# Patient Record
Sex: Female | Born: 1940 | Race: White | Hispanic: No | Marital: Married | Smoking: Former smoker
Health system: Southern US, Community
[De-identification: ages and names within clinical notes are randomized; demographics above are authoritative.]

## PROBLEM LIST (undated history)

## (undated) DIAGNOSIS — N189 Chronic kidney disease, unspecified: Secondary | ICD-10-CM

## (undated) DIAGNOSIS — I10 Essential (primary) hypertension: Secondary | ICD-10-CM

## (undated) DIAGNOSIS — R0600 Dyspnea, unspecified: Secondary | ICD-10-CM

## (undated) DIAGNOSIS — N952 Postmenopausal atrophic vaginitis: Secondary | ICD-10-CM

## (undated) DIAGNOSIS — K635 Polyp of colon: Secondary | ICD-10-CM

## (undated) DIAGNOSIS — M1990 Unspecified osteoarthritis, unspecified site: Secondary | ICD-10-CM

## (undated) DIAGNOSIS — H5316 Psychophysical visual disturbances: Secondary | ICD-10-CM

## (undated) DIAGNOSIS — N183 Chronic kidney disease, stage 3 unspecified: Secondary | ICD-10-CM

## (undated) DIAGNOSIS — R05 Cough: Secondary | ICD-10-CM

## (undated) DIAGNOSIS — R062 Wheezing: Secondary | ICD-10-CM

## (undated) DIAGNOSIS — E785 Hyperlipidemia, unspecified: Secondary | ICD-10-CM

## (undated) DIAGNOSIS — M4317 Spondylolisthesis, lumbosacral region: Secondary | ICD-10-CM

## (undated) DIAGNOSIS — I5032 Chronic diastolic (congestive) heart failure: Secondary | ICD-10-CM

## (undated) DIAGNOSIS — I2510 Atherosclerotic heart disease of native coronary artery without angina pectoris: Secondary | ICD-10-CM

## (undated) DIAGNOSIS — K219 Gastro-esophageal reflux disease without esophagitis: Secondary | ICD-10-CM

## (undated) DIAGNOSIS — D638 Anemia in other chronic diseases classified elsewhere: Secondary | ICD-10-CM

## (undated) DIAGNOSIS — F329 Major depressive disorder, single episode, unspecified: Secondary | ICD-10-CM

## (undated) DIAGNOSIS — R443 Hallucinations, unspecified: Secondary | ICD-10-CM

## (undated) DIAGNOSIS — G4733 Obstructive sleep apnea (adult) (pediatric): Secondary | ICD-10-CM

## (undated) DIAGNOSIS — A809 Acute poliomyelitis, unspecified: Secondary | ICD-10-CM

## (undated) DIAGNOSIS — Z9889 Other specified postprocedural states: Secondary | ICD-10-CM

## (undated) DIAGNOSIS — C801 Malignant (primary) neoplasm, unspecified: Secondary | ICD-10-CM

## (undated) DIAGNOSIS — I639 Cerebral infarction, unspecified: Secondary | ICD-10-CM

## (undated) DIAGNOSIS — E559 Vitamin D deficiency, unspecified: Secondary | ICD-10-CM

## (undated) DIAGNOSIS — F32A Depression, unspecified: Secondary | ICD-10-CM

## (undated) DIAGNOSIS — R112 Nausea with vomiting, unspecified: Secondary | ICD-10-CM

## (undated) DIAGNOSIS — J45909 Unspecified asthma, uncomplicated: Secondary | ICD-10-CM

## (undated) DIAGNOSIS — E039 Hypothyroidism, unspecified: Secondary | ICD-10-CM

## (undated) DIAGNOSIS — J309 Allergic rhinitis, unspecified: Secondary | ICD-10-CM

## (undated) DIAGNOSIS — G4730 Sleep apnea, unspecified: Secondary | ICD-10-CM

## (undated) DIAGNOSIS — I219 Acute myocardial infarction, unspecified: Secondary | ICD-10-CM

## (undated) HISTORY — DX: Dyspnea, unspecified: R06.00

## (undated) HISTORY — DX: Obstructive sleep apnea (adult) (pediatric): G47.33

## (undated) HISTORY — DX: Malignant (primary) neoplasm, unspecified: C80.1

## (undated) HISTORY — DX: Hyperlipidemia, unspecified: E78.5

## (undated) HISTORY — DX: Allergic rhinitis, unspecified: J30.9

## (undated) HISTORY — DX: Wheezing: R06.2

## (undated) HISTORY — DX: Acute myocardial infarction, unspecified: I21.9

## (undated) HISTORY — DX: Cough: R05

## (undated) HISTORY — DX: Atherosclerotic heart disease of native coronary artery without angina pectoris: I25.10

## (undated) HISTORY — DX: Chronic kidney disease, stage 3 unspecified: N18.30

## (undated) HISTORY — PX: CORONARY BALLOON ANGIOPLASTY: CATH118233

## (undated) HISTORY — DX: Psychophysical visual disturbances: H53.16

## (undated) HISTORY — DX: Anemia in other chronic diseases classified elsewhere: D63.8

## (undated) HISTORY — DX: Depression, unspecified: F32.A

## (undated) HISTORY — DX: Chronic kidney disease, unspecified: N18.9

## (undated) HISTORY — DX: Polyp of colon: K63.5

## (undated) HISTORY — DX: Essential (primary) hypertension: I10

## (undated) HISTORY — DX: Chronic diastolic (congestive) heart failure: I50.32

## (undated) HISTORY — DX: Postmenopausal atrophic vaginitis: N95.2

## (undated) HISTORY — DX: Hypothyroidism, unspecified: E03.9

## (undated) HISTORY — PX: CORONARY ANGIOPLASTY WITH STENT PLACEMENT: SHX49

## (undated) HISTORY — PX: CHOLECYSTECTOMY: SHX55

## (undated) HISTORY — DX: Vitamin D deficiency, unspecified: E55.9

## (undated) HISTORY — DX: Major depressive disorder, single episode, unspecified: F32.9

## (undated) HISTORY — DX: Acute poliomyelitis, unspecified: A80.9

## (undated) HISTORY — DX: Unspecified asthma, uncomplicated: J45.909

## (undated) HISTORY — DX: Chronic kidney disease, stage 3 (moderate): N18.3

## (undated) HISTORY — DX: Gastro-esophageal reflux disease without esophagitis: K21.9

## (undated) HISTORY — DX: Hallucinations, unspecified: R44.3

## (undated) HISTORY — PX: APPENDECTOMY: SHX54

## (undated) HISTORY — PX: CAROTID STENT: SHX1301

## (undated) HISTORY — DX: Spondylolisthesis, lumbosacral region: M43.17

## (undated) HISTORY — PX: TOTAL ABDOMINAL HYSTERECTOMY: SHX209

---

## 1997-09-06 ENCOUNTER — Encounter (HOSPITAL_COMMUNITY): Admission: RE | Admit: 1997-09-06 | Discharge: 1997-12-05 | Payer: Self-pay | Admitting: Cardiology

## 1997-12-07 ENCOUNTER — Inpatient Hospital Stay (HOSPITAL_COMMUNITY): Admission: AD | Admit: 1997-12-07 | Discharge: 1997-12-09 | Payer: Self-pay | Admitting: Cardiology

## 1998-02-23 ENCOUNTER — Inpatient Hospital Stay (HOSPITAL_COMMUNITY): Admission: EM | Admit: 1998-02-23 | Discharge: 1998-02-25 | Payer: Self-pay | Admitting: *Deleted

## 2005-10-16 ENCOUNTER — Encounter: Payer: Self-pay | Admitting: Internal Medicine

## 2013-08-17 DIAGNOSIS — R443 Hallucinations, unspecified: Secondary | ICD-10-CM

## 2013-08-17 DIAGNOSIS — I639 Cerebral infarction, unspecified: Secondary | ICD-10-CM | POA: Insufficient documentation

## 2013-08-17 DIAGNOSIS — H5316 Psychophysical visual disturbances: Secondary | ICD-10-CM

## 2013-08-17 DIAGNOSIS — F32A Depression, unspecified: Secondary | ICD-10-CM | POA: Insufficient documentation

## 2013-08-17 DIAGNOSIS — F329 Major depressive disorder, single episode, unspecified: Secondary | ICD-10-CM | POA: Insufficient documentation

## 2013-08-17 HISTORY — DX: Psychophysical visual disturbances: H53.16

## 2013-08-17 HISTORY — DX: Hallucinations, unspecified: R44.3

## 2014-06-28 DIAGNOSIS — G4733 Obstructive sleep apnea (adult) (pediatric): Secondary | ICD-10-CM | POA: Diagnosis not present

## 2014-07-13 DIAGNOSIS — J208 Acute bronchitis due to other specified organisms: Secondary | ICD-10-CM | POA: Diagnosis not present

## 2014-07-13 DIAGNOSIS — Z6832 Body mass index (BMI) 32.0-32.9, adult: Secondary | ICD-10-CM | POA: Diagnosis not present

## 2014-07-16 DIAGNOSIS — E785 Hyperlipidemia, unspecified: Secondary | ICD-10-CM | POA: Diagnosis not present

## 2014-07-16 DIAGNOSIS — Z6831 Body mass index (BMI) 31.0-31.9, adult: Secondary | ICD-10-CM | POA: Diagnosis not present

## 2014-07-16 DIAGNOSIS — I2510 Atherosclerotic heart disease of native coronary artery without angina pectoris: Secondary | ICD-10-CM | POA: Diagnosis not present

## 2014-07-16 DIAGNOSIS — E039 Hypothyroidism, unspecified: Secondary | ICD-10-CM | POA: Diagnosis not present

## 2014-07-16 DIAGNOSIS — D509 Iron deficiency anemia, unspecified: Secondary | ICD-10-CM | POA: Diagnosis not present

## 2014-07-16 DIAGNOSIS — R7301 Impaired fasting glucose: Secondary | ICD-10-CM | POA: Diagnosis not present

## 2014-07-29 DIAGNOSIS — G4733 Obstructive sleep apnea (adult) (pediatric): Secondary | ICD-10-CM | POA: Diagnosis not present

## 2014-08-11 DIAGNOSIS — Z6832 Body mass index (BMI) 32.0-32.9, adult: Secondary | ICD-10-CM | POA: Diagnosis not present

## 2014-08-11 DIAGNOSIS — J019 Acute sinusitis, unspecified: Secondary | ICD-10-CM | POA: Diagnosis not present

## 2014-08-11 DIAGNOSIS — J208 Acute bronchitis due to other specified organisms: Secondary | ICD-10-CM | POA: Diagnosis not present

## 2014-08-16 DIAGNOSIS — H5316 Psychophysical visual disturbances: Secondary | ICD-10-CM | POA: Diagnosis not present

## 2014-08-16 DIAGNOSIS — I6330 Cerebral infarction due to thrombosis of unspecified cerebral artery: Secondary | ICD-10-CM | POA: Diagnosis not present

## 2014-08-16 DIAGNOSIS — G4733 Obstructive sleep apnea (adult) (pediatric): Secondary | ICD-10-CM | POA: Diagnosis not present

## 2014-08-26 DIAGNOSIS — H18411 Arcus senilis, right eye: Secondary | ICD-10-CM | POA: Diagnosis not present

## 2014-08-26 DIAGNOSIS — H02839 Dermatochalasis of unspecified eye, unspecified eyelid: Secondary | ICD-10-CM | POA: Diagnosis not present

## 2014-08-26 DIAGNOSIS — H2511 Age-related nuclear cataract, right eye: Secondary | ICD-10-CM | POA: Diagnosis not present

## 2014-08-26 DIAGNOSIS — H18412 Arcus senilis, left eye: Secondary | ICD-10-CM | POA: Diagnosis not present

## 2014-08-27 DIAGNOSIS — G4733 Obstructive sleep apnea (adult) (pediatric): Secondary | ICD-10-CM | POA: Diagnosis not present

## 2014-09-27 DIAGNOSIS — G4733 Obstructive sleep apnea (adult) (pediatric): Secondary | ICD-10-CM | POA: Diagnosis not present

## 2014-10-15 DIAGNOSIS — E785 Hyperlipidemia, unspecified: Secondary | ICD-10-CM | POA: Diagnosis not present

## 2014-10-15 DIAGNOSIS — D509 Iron deficiency anemia, unspecified: Secondary | ICD-10-CM | POA: Diagnosis not present

## 2014-10-15 DIAGNOSIS — R0982 Postnasal drip: Secondary | ICD-10-CM | POA: Diagnosis not present

## 2014-10-15 DIAGNOSIS — E039 Hypothyroidism, unspecified: Secondary | ICD-10-CM | POA: Diagnosis not present

## 2014-10-15 DIAGNOSIS — I2510 Atherosclerotic heart disease of native coronary artery without angina pectoris: Secondary | ICD-10-CM | POA: Diagnosis not present

## 2014-10-24 DIAGNOSIS — H25811 Combined forms of age-related cataract, right eye: Secondary | ICD-10-CM | POA: Diagnosis not present

## 2014-10-24 DIAGNOSIS — H2511 Age-related nuclear cataract, right eye: Secondary | ICD-10-CM | POA: Diagnosis not present

## 2014-10-25 DIAGNOSIS — H2512 Age-related nuclear cataract, left eye: Secondary | ICD-10-CM | POA: Diagnosis not present

## 2014-10-27 DIAGNOSIS — G4733 Obstructive sleep apnea (adult) (pediatric): Secondary | ICD-10-CM | POA: Diagnosis not present

## 2014-11-07 DIAGNOSIS — H2511 Age-related nuclear cataract, right eye: Secondary | ICD-10-CM | POA: Diagnosis not present

## 2014-11-07 DIAGNOSIS — H25812 Combined forms of age-related cataract, left eye: Secondary | ICD-10-CM | POA: Diagnosis not present

## 2014-11-27 DIAGNOSIS — G4733 Obstructive sleep apnea (adult) (pediatric): Secondary | ICD-10-CM | POA: Diagnosis not present

## 2014-12-27 DIAGNOSIS — G4733 Obstructive sleep apnea (adult) (pediatric): Secondary | ICD-10-CM | POA: Diagnosis not present

## 2015-01-12 DIAGNOSIS — K5730 Diverticulosis of large intestine without perforation or abscess without bleeding: Secondary | ICD-10-CM | POA: Diagnosis not present

## 2015-01-13 DIAGNOSIS — Z139 Encounter for screening, unspecified: Secondary | ICD-10-CM | POA: Diagnosis not present

## 2015-01-13 DIAGNOSIS — D509 Iron deficiency anemia, unspecified: Secondary | ICD-10-CM | POA: Diagnosis not present

## 2015-01-13 DIAGNOSIS — E785 Hyperlipidemia, unspecified: Secondary | ICD-10-CM | POA: Diagnosis not present

## 2015-01-13 DIAGNOSIS — I2510 Atherosclerotic heart disease of native coronary artery without angina pectoris: Secondary | ICD-10-CM | POA: Diagnosis not present

## 2015-01-13 DIAGNOSIS — Z9181 History of falling: Secondary | ICD-10-CM | POA: Diagnosis not present

## 2015-01-13 DIAGNOSIS — E039 Hypothyroidism, unspecified: Secondary | ICD-10-CM | POA: Diagnosis not present

## 2015-01-24 DIAGNOSIS — R269 Unspecified abnormalities of gait and mobility: Secondary | ICD-10-CM | POA: Diagnosis not present

## 2015-01-24 DIAGNOSIS — Z6833 Body mass index (BMI) 33.0-33.9, adult: Secondary | ICD-10-CM | POA: Diagnosis not present

## 2015-01-24 DIAGNOSIS — I10 Essential (primary) hypertension: Secondary | ICD-10-CM | POA: Diagnosis not present

## 2015-01-24 DIAGNOSIS — R531 Weakness: Secondary | ICD-10-CM | POA: Diagnosis not present

## 2015-01-24 DIAGNOSIS — R270 Ataxia, unspecified: Secondary | ICD-10-CM | POA: Diagnosis not present

## 2015-01-24 DIAGNOSIS — M6281 Muscle weakness (generalized): Secondary | ICD-10-CM | POA: Diagnosis not present

## 2015-01-27 DIAGNOSIS — G4733 Obstructive sleep apnea (adult) (pediatric): Secondary | ICD-10-CM | POA: Diagnosis not present

## 2015-02-02 DIAGNOSIS — Z1231 Encounter for screening mammogram for malignant neoplasm of breast: Secondary | ICD-10-CM | POA: Diagnosis not present

## 2015-02-07 DIAGNOSIS — I25119 Atherosclerotic heart disease of native coronary artery with unspecified angina pectoris: Secondary | ICD-10-CM | POA: Diagnosis not present

## 2015-02-07 DIAGNOSIS — E785 Hyperlipidemia, unspecified: Secondary | ICD-10-CM | POA: Diagnosis not present

## 2015-02-07 DIAGNOSIS — I5032 Chronic diastolic (congestive) heart failure: Secondary | ICD-10-CM | POA: Diagnosis not present

## 2015-02-07 DIAGNOSIS — I110 Hypertensive heart disease with heart failure: Secondary | ICD-10-CM | POA: Diagnosis not present

## 2015-02-13 DIAGNOSIS — R609 Edema, unspecified: Secondary | ICD-10-CM | POA: Diagnosis not present

## 2015-02-15 DIAGNOSIS — I951 Orthostatic hypotension: Secondary | ICD-10-CM | POA: Diagnosis not present

## 2015-02-15 DIAGNOSIS — N179 Acute kidney failure, unspecified: Secondary | ICD-10-CM | POA: Diagnosis not present

## 2015-02-15 DIAGNOSIS — E860 Dehydration: Secondary | ICD-10-CM | POA: Diagnosis not present

## 2015-02-15 DIAGNOSIS — E876 Hypokalemia: Secondary | ICD-10-CM | POA: Diagnosis not present

## 2015-02-15 DIAGNOSIS — I5032 Chronic diastolic (congestive) heart failure: Secondary | ICD-10-CM | POA: Diagnosis not present

## 2015-02-15 DIAGNOSIS — N189 Chronic kidney disease, unspecified: Secondary | ICD-10-CM | POA: Diagnosis not present

## 2015-02-15 DIAGNOSIS — I110 Hypertensive heart disease with heart failure: Secondary | ICD-10-CM | POA: Diagnosis not present

## 2015-02-15 DIAGNOSIS — R635 Abnormal weight gain: Secondary | ICD-10-CM | POA: Diagnosis not present

## 2015-02-27 DIAGNOSIS — N179 Acute kidney failure, unspecified: Secondary | ICD-10-CM | POA: Diagnosis not present

## 2015-02-27 DIAGNOSIS — R0602 Shortness of breath: Secondary | ICD-10-CM | POA: Diagnosis not present

## 2015-02-27 DIAGNOSIS — I5032 Chronic diastolic (congestive) heart failure: Secondary | ICD-10-CM | POA: Diagnosis not present

## 2015-02-27 DIAGNOSIS — G4733 Obstructive sleep apnea (adult) (pediatric): Secondary | ICD-10-CM | POA: Diagnosis not present

## 2015-02-27 DIAGNOSIS — I5021 Acute systolic (congestive) heart failure: Secondary | ICD-10-CM | POA: Diagnosis not present

## 2015-03-02 DIAGNOSIS — H1013 Acute atopic conjunctivitis, bilateral: Secondary | ICD-10-CM | POA: Diagnosis not present

## 2015-03-02 DIAGNOSIS — I5032 Chronic diastolic (congestive) heart failure: Secondary | ICD-10-CM | POA: Diagnosis not present

## 2015-03-04 DIAGNOSIS — E039 Hypothyroidism, unspecified: Secondary | ICD-10-CM | POA: Diagnosis not present

## 2015-03-04 DIAGNOSIS — J209 Acute bronchitis, unspecified: Secondary | ICD-10-CM | POA: Diagnosis not present

## 2015-03-04 DIAGNOSIS — K219 Gastro-esophageal reflux disease without esophagitis: Secondary | ICD-10-CM | POA: Diagnosis not present

## 2015-03-04 DIAGNOSIS — R05 Cough: Secondary | ICD-10-CM | POA: Diagnosis not present

## 2015-03-04 DIAGNOSIS — Z79899 Other long term (current) drug therapy: Secondary | ICD-10-CM | POA: Diagnosis not present

## 2015-03-04 DIAGNOSIS — R0602 Shortness of breath: Secondary | ICD-10-CM | POA: Diagnosis not present

## 2015-03-04 DIAGNOSIS — E780 Pure hypercholesterolemia: Secondary | ICD-10-CM | POA: Diagnosis not present

## 2015-03-04 DIAGNOSIS — I509 Heart failure, unspecified: Secondary | ICD-10-CM | POA: Diagnosis not present

## 2015-03-04 DIAGNOSIS — Z8673 Personal history of transient ischemic attack (TIA), and cerebral infarction without residual deficits: Secondary | ICD-10-CM | POA: Diagnosis not present

## 2015-03-04 DIAGNOSIS — F419 Anxiety disorder, unspecified: Secondary | ICD-10-CM | POA: Diagnosis not present

## 2015-03-04 DIAGNOSIS — J449 Chronic obstructive pulmonary disease, unspecified: Secondary | ICD-10-CM | POA: Diagnosis not present

## 2015-03-04 DIAGNOSIS — I252 Old myocardial infarction: Secondary | ICD-10-CM | POA: Diagnosis not present

## 2015-03-04 DIAGNOSIS — I10 Essential (primary) hypertension: Secondary | ICD-10-CM | POA: Diagnosis not present

## 2015-03-08 DIAGNOSIS — Z6834 Body mass index (BMI) 34.0-34.9, adult: Secondary | ICD-10-CM | POA: Diagnosis not present

## 2015-03-08 DIAGNOSIS — J208 Acute bronchitis due to other specified organisms: Secondary | ICD-10-CM | POA: Diagnosis not present

## 2015-03-27 DIAGNOSIS — I5021 Acute systolic (congestive) heart failure: Secondary | ICD-10-CM | POA: Diagnosis not present

## 2015-03-27 DIAGNOSIS — R0602 Shortness of breath: Secondary | ICD-10-CM | POA: Diagnosis not present

## 2015-03-27 DIAGNOSIS — I5032 Chronic diastolic (congestive) heart failure: Secondary | ICD-10-CM | POA: Diagnosis not present

## 2015-03-29 DIAGNOSIS — G4733 Obstructive sleep apnea (adult) (pediatric): Secondary | ICD-10-CM | POA: Diagnosis not present

## 2015-03-30 DIAGNOSIS — R0602 Shortness of breath: Secondary | ICD-10-CM | POA: Diagnosis not present

## 2015-03-30 DIAGNOSIS — I5021 Acute systolic (congestive) heart failure: Secondary | ICD-10-CM | POA: Diagnosis not present

## 2015-04-29 DIAGNOSIS — G4733 Obstructive sleep apnea (adult) (pediatric): Secondary | ICD-10-CM | POA: Diagnosis not present

## 2015-05-02 DIAGNOSIS — I2510 Atherosclerotic heart disease of native coronary artery without angina pectoris: Secondary | ICD-10-CM

## 2015-05-02 DIAGNOSIS — N183 Chronic kidney disease, stage 3 (moderate): Secondary | ICD-10-CM | POA: Diagnosis not present

## 2015-05-02 DIAGNOSIS — R0602 Shortness of breath: Secondary | ICD-10-CM | POA: Diagnosis not present

## 2015-05-02 DIAGNOSIS — I5032 Chronic diastolic (congestive) heart failure: Secondary | ICD-10-CM | POA: Diagnosis not present

## 2015-05-02 DIAGNOSIS — R05 Cough: Secondary | ICD-10-CM | POA: Diagnosis not present

## 2015-05-02 DIAGNOSIS — I25119 Atherosclerotic heart disease of native coronary artery with unspecified angina pectoris: Secondary | ICD-10-CM | POA: Insufficient documentation

## 2015-05-02 DIAGNOSIS — I110 Hypertensive heart disease with heart failure: Secondary | ICD-10-CM | POA: Diagnosis not present

## 2015-05-02 HISTORY — DX: Atherosclerotic heart disease of native coronary artery without angina pectoris: I25.10

## 2015-05-10 DIAGNOSIS — Z7982 Long term (current) use of aspirin: Secondary | ICD-10-CM | POA: Diagnosis not present

## 2015-05-10 DIAGNOSIS — N183 Chronic kidney disease, stage 3 (moderate): Secondary | ICD-10-CM | POA: Diagnosis not present

## 2015-05-10 DIAGNOSIS — Z8673 Personal history of transient ischemic attack (TIA), and cerebral infarction without residual deficits: Secondary | ICD-10-CM | POA: Diagnosis not present

## 2015-05-10 DIAGNOSIS — R0602 Shortness of breath: Secondary | ICD-10-CM | POA: Diagnosis not present

## 2015-05-10 DIAGNOSIS — Z79899 Other long term (current) drug therapy: Secondary | ICD-10-CM | POA: Diagnosis not present

## 2015-05-10 DIAGNOSIS — Z87891 Personal history of nicotine dependence: Secondary | ICD-10-CM | POA: Diagnosis not present

## 2015-05-10 DIAGNOSIS — I2510 Atherosclerotic heart disease of native coronary artery without angina pectoris: Secondary | ICD-10-CM | POA: Diagnosis not present

## 2015-05-10 DIAGNOSIS — Z955 Presence of coronary angioplasty implant and graft: Secondary | ICD-10-CM | POA: Diagnosis not present

## 2015-05-10 DIAGNOSIS — I5032 Chronic diastolic (congestive) heart failure: Secondary | ICD-10-CM | POA: Diagnosis not present

## 2015-05-10 DIAGNOSIS — I130 Hypertensive heart and chronic kidney disease with heart failure and stage 1 through stage 4 chronic kidney disease, or unspecified chronic kidney disease: Secondary | ICD-10-CM | POA: Diagnosis not present

## 2015-05-10 DIAGNOSIS — E785 Hyperlipidemia, unspecified: Secondary | ICD-10-CM | POA: Diagnosis not present

## 2015-05-29 DIAGNOSIS — G4733 Obstructive sleep apnea (adult) (pediatric): Secondary | ICD-10-CM | POA: Diagnosis not present

## 2015-06-05 DIAGNOSIS — R0602 Shortness of breath: Secondary | ICD-10-CM | POA: Diagnosis not present

## 2015-06-05 DIAGNOSIS — I5032 Chronic diastolic (congestive) heart failure: Secondary | ICD-10-CM | POA: Diagnosis not present

## 2015-06-05 DIAGNOSIS — E785 Hyperlipidemia, unspecified: Secondary | ICD-10-CM | POA: Diagnosis not present

## 2015-06-05 DIAGNOSIS — I2510 Atherosclerotic heart disease of native coronary artery without angina pectoris: Secondary | ICD-10-CM | POA: Diagnosis not present

## 2015-06-05 DIAGNOSIS — N189 Chronic kidney disease, unspecified: Secondary | ICD-10-CM

## 2015-06-05 HISTORY — DX: Chronic kidney disease, unspecified: N18.9

## 2015-06-29 DIAGNOSIS — G4733 Obstructive sleep apnea (adult) (pediatric): Secondary | ICD-10-CM | POA: Diagnosis not present

## 2015-07-13 DIAGNOSIS — K5730 Diverticulosis of large intestine without perforation or abscess without bleeding: Secondary | ICD-10-CM | POA: Diagnosis not present

## 2015-07-15 DIAGNOSIS — R0600 Dyspnea, unspecified: Secondary | ICD-10-CM | POA: Diagnosis not present

## 2015-07-15 DIAGNOSIS — Z1389 Encounter for screening for other disorder: Secondary | ICD-10-CM | POA: Diagnosis not present

## 2015-07-15 DIAGNOSIS — I2510 Atherosclerotic heart disease of native coronary artery without angina pectoris: Secondary | ICD-10-CM | POA: Diagnosis not present

## 2015-07-15 DIAGNOSIS — E039 Hypothyroidism, unspecified: Secondary | ICD-10-CM | POA: Diagnosis not present

## 2015-07-15 DIAGNOSIS — I10 Essential (primary) hypertension: Secondary | ICD-10-CM | POA: Diagnosis not present

## 2015-07-15 DIAGNOSIS — Z23 Encounter for immunization: Secondary | ICD-10-CM | POA: Diagnosis not present

## 2015-07-15 DIAGNOSIS — E785 Hyperlipidemia, unspecified: Secondary | ICD-10-CM | POA: Diagnosis not present

## 2015-07-15 DIAGNOSIS — D631 Anemia in chronic kidney disease: Secondary | ICD-10-CM | POA: Diagnosis not present

## 2015-07-30 DIAGNOSIS — G4733 Obstructive sleep apnea (adult) (pediatric): Secondary | ICD-10-CM | POA: Diagnosis not present

## 2015-08-01 DIAGNOSIS — K5730 Diverticulosis of large intestine without perforation or abscess without bleeding: Secondary | ICD-10-CM | POA: Diagnosis not present

## 2015-08-01 DIAGNOSIS — Z8673 Personal history of transient ischemic attack (TIA), and cerebral infarction without residual deficits: Secondary | ICD-10-CM | POA: Diagnosis not present

## 2015-08-01 DIAGNOSIS — Z79899 Other long term (current) drug therapy: Secondary | ICD-10-CM | POA: Diagnosis not present

## 2015-08-01 DIAGNOSIS — D124 Benign neoplasm of descending colon: Secondary | ICD-10-CM | POA: Diagnosis not present

## 2015-08-01 DIAGNOSIS — E039 Hypothyroidism, unspecified: Secondary | ICD-10-CM | POA: Diagnosis not present

## 2015-08-01 DIAGNOSIS — Z955 Presence of coronary angioplasty implant and graft: Secondary | ICD-10-CM | POA: Diagnosis not present

## 2015-08-01 DIAGNOSIS — D122 Benign neoplasm of ascending colon: Secondary | ICD-10-CM | POA: Diagnosis not present

## 2015-08-01 DIAGNOSIS — K621 Rectal polyp: Secondary | ICD-10-CM | POA: Diagnosis not present

## 2015-08-01 DIAGNOSIS — I10 Essential (primary) hypertension: Secondary | ICD-10-CM | POA: Diagnosis not present

## 2015-08-01 DIAGNOSIS — Z86010 Personal history of colonic polyps: Secondary | ICD-10-CM | POA: Diagnosis not present

## 2015-08-01 DIAGNOSIS — Z800 Family history of malignant neoplasm of digestive organs: Secondary | ICD-10-CM | POA: Diagnosis not present

## 2015-08-01 DIAGNOSIS — D128 Benign neoplasm of rectum: Secondary | ICD-10-CM | POA: Diagnosis not present

## 2015-08-01 DIAGNOSIS — Z9049 Acquired absence of other specified parts of digestive tract: Secondary | ICD-10-CM | POA: Diagnosis not present

## 2015-08-01 DIAGNOSIS — Z1211 Encounter for screening for malignant neoplasm of colon: Secondary | ICD-10-CM | POA: Diagnosis not present

## 2015-08-08 DIAGNOSIS — I6330 Cerebral infarction due to thrombosis of unspecified cerebral artery: Secondary | ICD-10-CM | POA: Diagnosis not present

## 2015-08-08 DIAGNOSIS — R443 Hallucinations, unspecified: Secondary | ICD-10-CM | POA: Diagnosis not present

## 2015-08-27 DIAGNOSIS — G4733 Obstructive sleep apnea (adult) (pediatric): Secondary | ICD-10-CM | POA: Diagnosis not present

## 2015-09-05 DIAGNOSIS — M5136 Other intervertebral disc degeneration, lumbar region: Secondary | ICD-10-CM | POA: Diagnosis not present

## 2015-09-07 DIAGNOSIS — J208 Acute bronchitis due to other specified organisms: Secondary | ICD-10-CM | POA: Diagnosis not present

## 2015-09-11 DIAGNOSIS — J208 Acute bronchitis due to other specified organisms: Secondary | ICD-10-CM | POA: Diagnosis not present

## 2015-09-15 DIAGNOSIS — J208 Acute bronchitis due to other specified organisms: Secondary | ICD-10-CM | POA: Diagnosis not present

## 2015-09-15 DIAGNOSIS — R0602 Shortness of breath: Secondary | ICD-10-CM | POA: Diagnosis not present

## 2015-09-15 DIAGNOSIS — R05 Cough: Secondary | ICD-10-CM | POA: Diagnosis not present

## 2015-09-18 DIAGNOSIS — J208 Acute bronchitis due to other specified organisms: Secondary | ICD-10-CM | POA: Diagnosis not present

## 2015-09-18 DIAGNOSIS — B370 Candidal stomatitis: Secondary | ICD-10-CM | POA: Diagnosis not present

## 2015-09-27 DIAGNOSIS — G4733 Obstructive sleep apnea (adult) (pediatric): Secondary | ICD-10-CM | POA: Diagnosis not present

## 2015-09-28 DIAGNOSIS — R140 Abdominal distension (gaseous): Secondary | ICD-10-CM | POA: Diagnosis not present

## 2015-09-28 DIAGNOSIS — K219 Gastro-esophageal reflux disease without esophagitis: Secondary | ICD-10-CM | POA: Diagnosis not present

## 2015-10-02 DIAGNOSIS — S92309A Fracture of unspecified metatarsal bone(s), unspecified foot, initial encounter for closed fracture: Secondary | ICD-10-CM | POA: Diagnosis not present

## 2015-10-02 DIAGNOSIS — S92351A Displaced fracture of fifth metatarsal bone, right foot, initial encounter for closed fracture: Secondary | ICD-10-CM | POA: Diagnosis not present

## 2015-10-02 DIAGNOSIS — M79671 Pain in right foot: Secondary | ICD-10-CM | POA: Diagnosis not present

## 2015-10-02 DIAGNOSIS — M85871 Other specified disorders of bone density and structure, right ankle and foot: Secondary | ICD-10-CM | POA: Diagnosis not present

## 2015-10-04 DIAGNOSIS — S99921A Unspecified injury of right foot, initial encounter: Secondary | ICD-10-CM | POA: Diagnosis not present

## 2015-10-16 DIAGNOSIS — S99921A Unspecified injury of right foot, initial encounter: Secondary | ICD-10-CM | POA: Diagnosis not present

## 2015-10-27 DIAGNOSIS — G4733 Obstructive sleep apnea (adult) (pediatric): Secondary | ICD-10-CM | POA: Diagnosis not present

## 2015-11-27 DIAGNOSIS — G4733 Obstructive sleep apnea (adult) (pediatric): Secondary | ICD-10-CM | POA: Diagnosis not present

## 2015-11-28 DIAGNOSIS — R399 Unspecified symptoms and signs involving the genitourinary system: Secondary | ICD-10-CM | POA: Diagnosis not present

## 2015-12-08 DIAGNOSIS — R0602 Shortness of breath: Secondary | ICD-10-CM | POA: Diagnosis not present

## 2015-12-08 DIAGNOSIS — I5032 Chronic diastolic (congestive) heart failure: Secondary | ICD-10-CM | POA: Diagnosis not present

## 2015-12-12 DIAGNOSIS — I5032 Chronic diastolic (congestive) heart failure: Secondary | ICD-10-CM | POA: Diagnosis not present

## 2015-12-13 DIAGNOSIS — L02413 Cutaneous abscess of right upper limb: Secondary | ICD-10-CM | POA: Diagnosis not present

## 2015-12-13 DIAGNOSIS — I5032 Chronic diastolic (congestive) heart failure: Secondary | ICD-10-CM | POA: Diagnosis not present

## 2015-12-14 DIAGNOSIS — I5032 Chronic diastolic (congestive) heart failure: Secondary | ICD-10-CM | POA: Diagnosis not present

## 2015-12-27 DIAGNOSIS — G4733 Obstructive sleep apnea (adult) (pediatric): Secondary | ICD-10-CM | POA: Diagnosis not present

## 2016-01-04 DIAGNOSIS — I25119 Atherosclerotic heart disease of native coronary artery with unspecified angina pectoris: Secondary | ICD-10-CM | POA: Diagnosis not present

## 2016-01-04 DIAGNOSIS — E785 Hyperlipidemia, unspecified: Secondary | ICD-10-CM | POA: Diagnosis not present

## 2016-01-04 DIAGNOSIS — I5032 Chronic diastolic (congestive) heart failure: Secondary | ICD-10-CM | POA: Diagnosis not present

## 2016-01-13 DIAGNOSIS — N390 Urinary tract infection, site not specified: Secondary | ICD-10-CM | POA: Diagnosis not present

## 2016-01-13 DIAGNOSIS — D638 Anemia in other chronic diseases classified elsewhere: Secondary | ICD-10-CM | POA: Diagnosis not present

## 2016-01-13 DIAGNOSIS — E039 Hypothyroidism, unspecified: Secondary | ICD-10-CM | POA: Diagnosis not present

## 2016-01-13 DIAGNOSIS — E785 Hyperlipidemia, unspecified: Secondary | ICD-10-CM | POA: Diagnosis not present

## 2016-01-13 DIAGNOSIS — I2510 Atherosclerotic heart disease of native coronary artery without angina pectoris: Secondary | ICD-10-CM | POA: Diagnosis not present

## 2016-01-13 DIAGNOSIS — I10 Essential (primary) hypertension: Secondary | ICD-10-CM | POA: Diagnosis not present

## 2016-01-27 DIAGNOSIS — G4733 Obstructive sleep apnea (adult) (pediatric): Secondary | ICD-10-CM | POA: Diagnosis not present

## 2016-01-30 DIAGNOSIS — R102 Pelvic and perineal pain: Secondary | ICD-10-CM | POA: Diagnosis not present

## 2016-01-30 DIAGNOSIS — N952 Postmenopausal atrophic vaginitis: Secondary | ICD-10-CM

## 2016-01-30 HISTORY — DX: Postmenopausal atrophic vaginitis: N95.2

## 2016-02-09 DIAGNOSIS — Z1231 Encounter for screening mammogram for malignant neoplasm of breast: Secondary | ICD-10-CM | POA: Diagnosis not present

## 2016-02-09 DIAGNOSIS — M810 Age-related osteoporosis without current pathological fracture: Secondary | ICD-10-CM | POA: Diagnosis not present

## 2016-02-27 DIAGNOSIS — G4733 Obstructive sleep apnea (adult) (pediatric): Secondary | ICD-10-CM | POA: Diagnosis not present

## 2016-03-09 DIAGNOSIS — J208 Acute bronchitis due to other specified organisms: Secondary | ICD-10-CM | POA: Diagnosis not present

## 2016-03-11 DIAGNOSIS — M545 Low back pain: Secondary | ICD-10-CM | POA: Diagnosis not present

## 2016-03-13 DIAGNOSIS — M545 Low back pain: Secondary | ICD-10-CM | POA: Diagnosis not present

## 2016-03-13 DIAGNOSIS — M546 Pain in thoracic spine: Secondary | ICD-10-CM | POA: Diagnosis not present

## 2016-03-28 DIAGNOSIS — G4733 Obstructive sleep apnea (adult) (pediatric): Secondary | ICD-10-CM | POA: Diagnosis not present

## 2016-04-28 DIAGNOSIS — G4733 Obstructive sleep apnea (adult) (pediatric): Secondary | ICD-10-CM | POA: Diagnosis not present

## 2016-05-07 DIAGNOSIS — I5032 Chronic diastolic (congestive) heart failure: Secondary | ICD-10-CM | POA: Diagnosis not present

## 2016-05-07 DIAGNOSIS — I2510 Atherosclerotic heart disease of native coronary artery without angina pectoris: Secondary | ICD-10-CM | POA: Diagnosis not present

## 2016-05-07 DIAGNOSIS — I110 Hypertensive heart disease with heart failure: Secondary | ICD-10-CM | POA: Diagnosis not present

## 2016-05-07 DIAGNOSIS — E785 Hyperlipidemia, unspecified: Secondary | ICD-10-CM | POA: Diagnosis not present

## 2016-05-20 DIAGNOSIS — M5416 Radiculopathy, lumbar region: Secondary | ICD-10-CM | POA: Diagnosis not present

## 2016-05-22 DIAGNOSIS — M5416 Radiculopathy, lumbar region: Secondary | ICD-10-CM | POA: Diagnosis not present

## 2016-05-22 DIAGNOSIS — M5126 Other intervertebral disc displacement, lumbar region: Secondary | ICD-10-CM | POA: Diagnosis not present

## 2016-05-24 DIAGNOSIS — M5416 Radiculopathy, lumbar region: Secondary | ICD-10-CM | POA: Diagnosis not present

## 2016-05-28 DIAGNOSIS — G4733 Obstructive sleep apnea (adult) (pediatric): Secondary | ICD-10-CM | POA: Diagnosis not present

## 2016-05-30 DIAGNOSIS — J208 Acute bronchitis due to other specified organisms: Secondary | ICD-10-CM | POA: Diagnosis not present

## 2016-06-13 DIAGNOSIS — M47817 Spondylosis without myelopathy or radiculopathy, lumbosacral region: Secondary | ICD-10-CM | POA: Diagnosis not present

## 2016-06-13 DIAGNOSIS — M5416 Radiculopathy, lumbar region: Secondary | ICD-10-CM | POA: Diagnosis not present

## 2016-06-28 DIAGNOSIS — G4733 Obstructive sleep apnea (adult) (pediatric): Secondary | ICD-10-CM | POA: Diagnosis not present

## 2016-07-20 DIAGNOSIS — E785 Hyperlipidemia, unspecified: Secondary | ICD-10-CM | POA: Diagnosis not present

## 2016-07-20 DIAGNOSIS — Z9181 History of falling: Secondary | ICD-10-CM | POA: Diagnosis not present

## 2016-07-20 DIAGNOSIS — I10 Essential (primary) hypertension: Secondary | ICD-10-CM | POA: Diagnosis not present

## 2016-07-20 DIAGNOSIS — E039 Hypothyroidism, unspecified: Secondary | ICD-10-CM | POA: Diagnosis not present

## 2016-07-20 DIAGNOSIS — I2510 Atherosclerotic heart disease of native coronary artery without angina pectoris: Secondary | ICD-10-CM | POA: Diagnosis not present

## 2016-07-20 DIAGNOSIS — Z1389 Encounter for screening for other disorder: Secondary | ICD-10-CM | POA: Diagnosis not present

## 2016-07-20 DIAGNOSIS — D638 Anemia in other chronic diseases classified elsewhere: Secondary | ICD-10-CM | POA: Diagnosis not present

## 2016-07-25 DIAGNOSIS — R0602 Shortness of breath: Secondary | ICD-10-CM | POA: Diagnosis not present

## 2016-07-25 DIAGNOSIS — J45909 Unspecified asthma, uncomplicated: Secondary | ICD-10-CM | POA: Diagnosis not present

## 2016-07-29 DIAGNOSIS — G4733 Obstructive sleep apnea (adult) (pediatric): Secondary | ICD-10-CM | POA: Diagnosis not present

## 2016-08-26 DIAGNOSIS — G4733 Obstructive sleep apnea (adult) (pediatric): Secondary | ICD-10-CM | POA: Diagnosis not present

## 2016-08-27 ENCOUNTER — Ambulatory Visit (INDEPENDENT_AMBULATORY_CARE_PROVIDER_SITE_OTHER): Payer: Medicare Other | Admitting: Emergency Medicine

## 2016-08-27 ENCOUNTER — Encounter: Payer: Self-pay | Admitting: Emergency Medicine

## 2016-08-27 VITALS — BP 116/74 | HR 73 | Ht 65.0 in | Wt 219.0 lb

## 2016-08-27 DIAGNOSIS — R059 Cough, unspecified: Secondary | ICD-10-CM

## 2016-08-27 DIAGNOSIS — R05 Cough: Secondary | ICD-10-CM | POA: Diagnosis not present

## 2016-08-27 DIAGNOSIS — R0602 Shortness of breath: Secondary | ICD-10-CM | POA: Diagnosis not present

## 2016-08-27 DIAGNOSIS — G4733 Obstructive sleep apnea (adult) (pediatric): Secondary | ICD-10-CM | POA: Diagnosis not present

## 2016-08-27 DIAGNOSIS — R062 Wheezing: Secondary | ICD-10-CM

## 2016-08-27 DIAGNOSIS — J301 Allergic rhinitis due to pollen: Secondary | ICD-10-CM

## 2016-08-27 MED ORDER — FLUTICASONE PROPIONATE 50 MCG/ACT NA SUSP: 2.0000 | Freq: Every day | NASAL | 2 refills | Status: DC

## 2016-08-27 NOTE — Patient Instructions (Signed)
Please stop Symbicort for now Increase your omeprazole to 20mg  twice a day until our next visit Start loratadine 10mg  daily Start fluticasone nasal spray, 2 sprays each nostril daily Start wearing your CPAP every night as you are able.  We will perform full pulmonary function testing at your next office visit.  Follow with Dr Lamonte Sakai next available with full PFT on the same day.

## 2016-08-27 NOTE — Progress Notes (Signed)
Subjective:    Patient ID: Jody Taylor, female    DOB: 1940-10-15, 76 y.o.   MRN: [id number]  HPI 76 year old woman with a history of hypertension, diastolic dysfunction, coronary artery disease, hypothyroidism, GERD. She has OSA but only uses CPAP occasionally. She is referred today for eval of dyspnea and wheezing. She reports that she has had slowly progressive exertional SOB over the last 2 yrs. She has also been hearing some wheeze vs UA noise for the year. No chest pain. She has a lot of cough, happens mainly at night. Non-productive. Has benefited from tussionex. She does have DuoNeb that she rarely uses. She uses albuterol HFA at night -  May help her some. She has been on Dulera qd for 2 years, recently changed to symbicort 2 weeks ago. She has astelin NS, takes prn. She has GERD sx, seems to be controlled on PPI.   She does snore some. Not when she uses the CPAP.    Review of Systems  Constitutional: Negative for fever and unexpected weight change.  HENT: Negative for congestion, dental problem, ear pain, nosebleeds, postnasal drip, rhinorrhea, sinus pressure, sneezing, sore throat and trouble swallowing.   Eyes: Negative for redness and itching.  Respiratory: Positive for cough and shortness of breath. Negative for chest tightness and wheezing.   Cardiovascular: Negative for palpitations and leg swelling.  Gastrointestinal: Negative for nausea and vomiting.  Genitourinary: Negative for dysuria.  Musculoskeletal: Negative for joint swelling.  Skin: Negative for rash.  Neurological: Negative for headaches.  Hematological: Does not bruise/bleed easily.  Psychiatric/Behavioral: Negative for dysphoric mood. The patient is not nervous/anxious.    Past Medical History:  Diagnosis Date  . 2-vessel coronary artery disease   . Adult hypothyroidism   . Anemia of chronic disease   . Chronic diastolic CHF (congestive heart failure) ([place])   . Chronic GERD   . Chronic kidney disease,  stage 3 (moderate)   . Colon polyp   . Depression   . Hyperlipidemia LDL goal <70   . Hypertension, essential, benign   . Vitamin D deficiency      No family history on file.   Social History   Social History  . Marital status: Single    Spouse name: N/A  . Number of children: N/A  . Years of education: N/A   Occupational History  . Not on file.   Social History Main Topics  . Smoking status: Never Smoker  . Smokeless tobacco: Never Used  . Alcohol use No  . Drug use: Unknown  . Sexual activity: Not on file   Other Topics Concern  . Not on file   Social History Narrative  . No narrative on file     Allergies  Allergen Reactions  . Ace Inhibitors Other (See Comments) and Cough    Chest pain   . Codeine   . Levaquin [Levofloxacin] Nausea And Vomiting     No outpatient prescriptions prior to visit.   No facility-administered medications prior to visit.         Objective:   Physical Exam Vitals:   08/27/16 1344  BP: 116/74  Pulse: 73  SpO2: 94%  Weight: 219 lb (99.3 kg)  Height: 5\' 5"  (1.651 m)   Gen: Pleasant, obese, in no distress,  normal affect  ENT: No lesions,  mouth clear,  oropharynx clear, no postnasal drip  Neck: No JVD, soft insp stridor.   Lungs: No use of accessory muscles, no wheeze  Cardiovascular: RRR,  heart sounds normal, no murmur or gallops, no peripheral edema  Musculoskeletal: No deformities, no cyanosis or clubbing  Neuro: alert, non focal  Skin: Warm, no lesions or rashes      Assessment & Plan:  Wheezing Appears to be most consistent with upper airway noise based on her description. She has been diagnosed with and treated for asthma clinically. Will attempt to confirm that dx w PFT. Stop BD for now  Allergic rhinitis Start loratadine 10mg  daily Start fluticasone nasal spray, 2 sprays each nostril daily   Obstructive sleep apnea Needs to work on CPAP compliance. Encouraged her to do this.   Cough Please stop  Symbicort for now Increase your omeprazole to 20mg  twice a day until our next visit Start loratadine 10mg  daily Start fluticasone nasal spray, 2 sprays each nostril daily.   Dyspnea Multifactorial. Full PFT ordered  Baltazar Apo, MD, PhD 09/12/2016, 9:23 AM [facility] [phone] or if no answer [phone]

## 2016-09-12 DIAGNOSIS — R05 Cough: Secondary | ICD-10-CM | POA: Insufficient documentation

## 2016-09-12 DIAGNOSIS — R0600 Dyspnea, unspecified: Secondary | ICD-10-CM

## 2016-09-12 DIAGNOSIS — R059 Cough, unspecified: Secondary | ICD-10-CM

## 2016-09-12 DIAGNOSIS — J309 Allergic rhinitis, unspecified: Secondary | ICD-10-CM | POA: Insufficient documentation

## 2016-09-12 DIAGNOSIS — R062 Wheezing: Secondary | ICD-10-CM | POA: Insufficient documentation

## 2016-09-12 DIAGNOSIS — G4733 Obstructive sleep apnea (adult) (pediatric): Secondary | ICD-10-CM | POA: Insufficient documentation

## 2016-09-12 HISTORY — DX: Obstructive sleep apnea (adult) (pediatric): G47.33

## 2016-09-12 HISTORY — DX: Dyspnea, unspecified: R06.00

## 2016-09-12 HISTORY — DX: Cough, unspecified: R05.9

## 2016-09-12 HISTORY — DX: Wheezing: R06.2

## 2016-09-12 HISTORY — DX: Allergic rhinitis, unspecified: J30.9

## 2016-09-12 NOTE — Assessment & Plan Note (Signed)
Please stop Symbicort for now Increase your omeprazole to 20mg  twice a day until our next visit Start loratadine 10mg  daily Start fluticasone nasal spray, 2 sprays each nostril daily.

## 2016-09-12 NOTE — Assessment & Plan Note (Addendum)
Appears to be most consistent with upper airway noise based on her description. She has been diagnosed with and treated for asthma clinically. Will attempt to confirm that dx w PFT. Stop BD for now

## 2016-09-12 NOTE — Assessment & Plan Note (Signed)
Start loratadine 10mg  daily Start fluticasone nasal spray, 2 sprays each nostril daily

## 2016-09-12 NOTE — Assessment & Plan Note (Signed)
Multifactorial. Full PFT ordered

## 2016-09-12 NOTE — Assessment & Plan Note (Signed)
Needs to work on CPAP compliance. Encouraged her to do this.

## 2016-09-24 DIAGNOSIS — M4807 Spinal stenosis, lumbosacral region: Secondary | ICD-10-CM | POA: Diagnosis not present

## 2016-09-24 DIAGNOSIS — M4317 Spondylolisthesis, lumbosacral region: Secondary | ICD-10-CM | POA: Diagnosis not present

## 2016-09-24 DIAGNOSIS — M5417 Radiculopathy, lumbosacral region: Secondary | ICD-10-CM | POA: Diagnosis not present

## 2016-09-24 DIAGNOSIS — M545 Low back pain: Secondary | ICD-10-CM | POA: Diagnosis not present

## 2016-09-26 DIAGNOSIS — G4733 Obstructive sleep apnea (adult) (pediatric): Secondary | ICD-10-CM | POA: Diagnosis not present

## 2016-10-02 DIAGNOSIS — R2689 Other abnormalities of gait and mobility: Secondary | ICD-10-CM | POA: Diagnosis not present

## 2016-10-02 DIAGNOSIS — M545 Low back pain: Secondary | ICD-10-CM | POA: Diagnosis not present

## 2016-10-02 DIAGNOSIS — M6281 Muscle weakness (generalized): Secondary | ICD-10-CM | POA: Diagnosis not present

## 2016-10-04 DIAGNOSIS — M6281 Muscle weakness (generalized): Secondary | ICD-10-CM | POA: Diagnosis not present

## 2016-10-04 DIAGNOSIS — M545 Low back pain: Secondary | ICD-10-CM | POA: Diagnosis not present

## 2016-10-04 DIAGNOSIS — R2689 Other abnormalities of gait and mobility: Secondary | ICD-10-CM | POA: Diagnosis not present

## 2016-10-07 DIAGNOSIS — M6281 Muscle weakness (generalized): Secondary | ICD-10-CM | POA: Diagnosis not present

## 2016-10-07 DIAGNOSIS — M545 Low back pain: Secondary | ICD-10-CM | POA: Diagnosis not present

## 2016-10-07 DIAGNOSIS — R2689 Other abnormalities of gait and mobility: Secondary | ICD-10-CM | POA: Diagnosis not present

## 2016-10-10 DIAGNOSIS — R2689 Other abnormalities of gait and mobility: Secondary | ICD-10-CM | POA: Diagnosis not present

## 2016-10-10 DIAGNOSIS — M545 Low back pain: Secondary | ICD-10-CM | POA: Diagnosis not present

## 2016-10-10 DIAGNOSIS — M6281 Muscle weakness (generalized): Secondary | ICD-10-CM | POA: Diagnosis not present

## 2016-10-11 ENCOUNTER — Other Ambulatory Visit: Payer: Self-pay | Admitting: Neurosurgery

## 2016-10-21 ENCOUNTER — Ambulatory Visit (INDEPENDENT_AMBULATORY_CARE_PROVIDER_SITE_OTHER): Payer: Medicare Other | Admitting: Emergency Medicine

## 2016-10-21 ENCOUNTER — Encounter: Payer: Self-pay | Admitting: Emergency Medicine

## 2016-10-21 DIAGNOSIS — R0602 Shortness of breath: Secondary | ICD-10-CM

## 2016-10-21 DIAGNOSIS — R062 Wheezing: Secondary | ICD-10-CM

## 2016-10-21 DIAGNOSIS — R05 Cough: Secondary | ICD-10-CM

## 2016-10-21 DIAGNOSIS — G4733 Obstructive sleep apnea (adult) (pediatric): Secondary | ICD-10-CM

## 2016-10-21 DIAGNOSIS — R059 Cough, unspecified: Secondary | ICD-10-CM

## 2016-10-21 LAB — PULMONARY FUNCTION TEST
DL/VA % PRED: 102 %
DL/VA: 4.72 ml/min/mmHg/L
DLCO COR % PRED: 75 %
DLCO COR: 16.9 ml/min/mmHg
DLCO unc % pred: 73 %
DLCO unc: 16.41 ml/min/mmHg
FEF 25-75 POST: 2.25 L/s
FEF 25-75 Pre: 1.89 L/sec
FEF2575-%CHANGE-POST: 19 %
FEF2575-%PRED-PRE: 118 %
FEF2575-%Pred-Post: 141 %
FEV1-%Change-Post: 4 %
FEV1-%Pred-Post: 93 %
FEV1-%Pred-Pre: 89 %
FEV1-Post: 1.86 L
FEV1-Pre: 1.78 L
FEV1FVC-%CHANGE-POST: 1 %
FEV1FVC-%PRED-PRE: 108 %
FEV6-%Change-Post: 2 %
FEV6-%PRED-PRE: 87 %
FEV6-%Pred-Post: 89 %
FEV6-Post: 2.24 L
FEV6-Pre: 2.19 L
FEV6FVC-%Change-Post: 0 %
FEV6FVC-%Pred-Post: 105 %
FEV6FVC-%Pred-Pre: 104 %
FVC-%Change-Post: 2 %
FVC-%PRED-POST: 85 %
FVC-%PRED-PRE: 83 %
FVC-POST: 2.25 L
FVC-PRE: 2.2 L
PRE FEV1/FVC RATIO: 81 %
Post FEV1/FVC ratio: 83 %
Post FEV6/FVC ratio: 100 %
Pre FEV6/FVC Ratio: 100 %
RV % pred: 85 %
RV: 1.9 L
TLC % pred: 86 %
TLC: 4.17 L

## 2016-10-21 NOTE — Addendum Note (Signed)
Addended by: Jannette Spanner on: 10/21/2016 12:43 PM   Modules accepted: Orders

## 2016-10-21 NOTE — Progress Notes (Signed)
Subjective:    Patient ID: Jody Taylor, female    DOB: March 18, 1941, 76 y.o.   MRN: [id number]  HPI 76 year old woman with a history of hypertension, diastolic dysfunction, coronary artery disease, hypothyroidism, GERD. She has OSA but only uses CPAP occasionally. She is referred today for eval of dyspnea and wheezing. She reports that she has had slowly progressive exertional SOB over the last 2 yrs. She has also been hearing some wheeze vs UA noise for the year. No chest pain. She has a lot of cough, happens mainly at night. Non-productive. Has benefited from tussionex. She does have DuoNeb that she rarely uses. She uses albuterol HFA at night -  May help her some. She has been on Dulera qd for 2 years, recently changed to symbicort 2 weeks ago. She has astelin NS, takes prn. She has GERD sx, seems to be controlled on PPI.   She does snore some. Not when she uses the CPAP.   ROV 10/21/16 -- This follow-up visit for evaluation of wheezing and associated shortness of breath. She has been hearing upper airway noise versus wheeze for several months. Also describes shortness of breath with exertion. She has obstructive sleep apnea and uses CPAP unreliably. She had been treated empirically for possible asthma, we stopped her ICS/LABA at our initial visit. She is also being treated for chronic rhinitis and GERD. We increased her omeprazole, added loratadine, added fluticasone nasal spray last visit. She underwent pulmonary function testing today that I have personally reviewed. This shows, no response to bronchodilator, normal lung volumes. She has a decreased diffusion capacity that corrects to the normal range when adjusted for alveolar volume. No evidence to support asthma.   Review of Systems  Constitutional: Negative for fever and unexpected weight change.  HENT: Negative for congestion, dental problem, ear pain, nosebleeds, postnasal drip, rhinorrhea, sinus pressure, sneezing, sore throat and trouble  swallowing.   Eyes: Negative for redness and itching.  Respiratory: Positive for cough and shortness of breath. Negative for chest tightness and wheezing.   Cardiovascular: Negative for palpitations and leg swelling.  Gastrointestinal: Negative for nausea and vomiting.  Genitourinary: Negative for dysuria.  Musculoskeletal: Negative for joint swelling.  Skin: Negative for rash.  Neurological: Negative for headaches.  Hematological: Does not bruise/bleed easily.  Psychiatric/Behavioral: Negative for dysphoric mood. The patient is not nervous/anxious.    Past Medical History:  Diagnosis Date  . 2-vessel coronary artery disease   . Adult hypothyroidism   . Anemia of chronic disease   . Chronic diastolic CHF (congestive heart failure) ([place])   . Chronic GERD   . Chronic kidney disease, stage 3 (moderate)   . Colon polyp   . Depression   . Hyperlipidemia LDL goal <70   . Hypertension, essential, benign   . Vitamin D deficiency      No family history on file.   Social History   Social History  . Marital status: Single    Spouse name: N/A  . Number of children: N/A  . Years of education: N/A   Occupational History  . Not on file.   Social History Main Topics  . Smoking status: Never Smoker  . Smokeless tobacco: Never Used  . Alcohol use No  . Drug use: Unknown  . Sexual activity: Not on file   Other Topics Concern  . Not on file   Social History Narrative  . No narrative on file     Allergies  Allergen Reactions  . Ace  Inhibitors Other (See Comments) and Cough    Chest pain   . Codeine   . Levaquin [Levofloxacin] Nausea And Vomiting     Outpatient Medications Prior to Visit  Medication Sig Dispense Refill  . Albuterol Sulfate 108 (90 Base) MCG/ACT AEPB Inhale into the lungs.    [name] aspirin EC 81 MG tablet Take 81 mg by mouth daily.    [name] atorvastatin (LIPITOR) 80 MG tablet Take 80 mg by mouth daily.    [name] azelastine (ASTELIN) 0.1 % nasal spray Place into both  nostrils 2 (two) times daily. Use in each nostril as directed    . buPROPion (WELLBUTRIN SR) 150 MG 12 hr tablet Take 150 mg by mouth 2 (two) times daily.    . celecoxib (CELEBREX) 200 MG capsule Take 200 mg by mouth 2 (two) times daily.    . citalopram (CELEXA) 40 MG tablet Take 40 mg by mouth daily.    . fluticasone (FLONASE) 50 MCG/ACT nasal spray Place 2 sprays into both nostrils daily. 16 g 2  . furosemide (LASIX) 80 MG tablet Take 80 mg by mouth 2 (two) times daily.    [name] ipratropium-albuterol (DUONEB) 0.5-2.5 (3) MG/3ML SOLN Take 3 mLs by nebulization.    . isosorbide mononitrate (IMDUR) 120 MG 24 hr tablet Take 120 mg by mouth daily.    [name] levothyroxine (SYNTHROID, LEVOTHROID) 50 MCG tablet Take 50 mcg by mouth daily before breakfast.    . metoprolol succinate (TOPROL-XL) 50 MG 24 hr tablet Take 50 mg by mouth daily. Take with or immediately following a meal.    . nystatin (MYCOSTATIN) 100000 UNIT/ML suspension Take 5 mLs by mouth 4 (four) times daily.    [name] omeprazole (PRILOSEC) 20 MG capsule Take 20 mg by mouth daily.    . Probiotic Product (RESTORA PO) Take by mouth.    . QUEtiapine (SEROQUEL) 25 MG tablet Take 25 mg by mouth at bedtime.    . traMADol (ULTRAM) 50 MG tablet Take by mouth every 6 (six) hours as needed.     No facility-administered medications prior to visit.         Objective:   Physical Exam Vitals:   10/21/16 1040  BP: 134/90  Pulse: 67  SpO2: 95%  Weight: 220 lb (99.8 kg)  Height: 5' 2.5" (1.588 m)   Gen: Pleasant, obese, in no distress,  normal affect  ENT: No lesions,  mouth clear,  oropharynx clear, no postnasal drip  Neck: No JVD, no stridor, no hoarseness  Lungs: No use of accessory muscles, no wheeze  Cardiovascular: RRR, heart sounds normal, no murmur or gallops, no peripheral edema  Musculoskeletal: No deformities, no cyanosis or clubbing  Neuro: alert, non focal  Skin: Warm, no lesions or rashes      Assessment & Plan:  Cough With  contribution from upper airway irritation, allergic rhinitis, possibly also breakthrough GERD. Her cough and upper airway noise are both better after the changes made last visit. We will decrease omeprazole back to once a day, continue fluticasone and loratadine. I will not restart a bronchodilator at this time based on her pulmonary function testing  Dyspnea No evidence for airflow obstruction on her pulmonary function testing. I suspect that her dyspnea is due in part to deconditioning. She also has some upper airway irritation that waxes and wanes. No evidence to support asthma. I do not believe she is high risk for surgery. She is considering back surgery and should be a suitable candidate for this.  Obstructive sleep apnea Not currently on reliable therapy. She believes that she might benefit from nasal pillows. We will try to get her new supplies, nasal pillows from a new DME. She owns her CPAP machine.  Wheezing Suspect upper airway in nature based on her reassuring pulmonary function testing.  Baltazar Apo, MD, PhD 10/21/2016, 11:03 AM [facility] [phone] or if no answer [phone]

## 2016-10-21 NOTE — Assessment & Plan Note (Signed)
Not currently on reliable therapy. She believes that she might benefit from nasal pillows. We will try to get her new supplies, nasal pillows from a new DME. She owns her CPAP machine.

## 2016-10-21 NOTE — Assessment & Plan Note (Addendum)
No evidence for airflow obstruction on her pulmonary function testing. I suspect that her dyspnea is due in part to deconditioning. She also has some upper airway irritation that waxes and wanes. No evidence to support asthma. I do not believe she is high risk for surgery. She is considering back surgery and should be a suitable candidate for this. Walking oximetry today to ensure that she does not desaturate.

## 2016-10-21 NOTE — Addendum Note (Signed)
Addended by: Jannette Spanner on: 10/21/2016 01:47 PM   Modules accepted: Orders

## 2016-10-21 NOTE — Progress Notes (Signed)
PFT done today. 

## 2016-10-21 NOTE — Patient Instructions (Signed)
We will perform a walking oximetry on room air today.  Try to work on using your CPAP every night. We will work on getting you nasal pillows as a new mask.  Try decreasing your omeprazole back down to once a day.  Continue your loratadine and fluticasone nasal spray as you are taking them.  Follow with Dr Lamonte Sakai in 3 months or sooner if you have any problems.

## 2016-10-21 NOTE — Assessment & Plan Note (Signed)
With contribution from upper airway irritation, allergic rhinitis, possibly also breakthrough GERD. Her cough and upper airway noise are both better after the changes made last visit. We will decrease omeprazole back to once a day, continue fluticasone and loratadine. I will not restart a bronchodilator at this time based on her pulmonary function testing

## 2016-10-21 NOTE — Assessment & Plan Note (Signed)
Suspect upper airway in nature based on her reassuring pulmonary function testing.

## 2016-10-26 DIAGNOSIS — H1013 Acute atopic conjunctivitis, bilateral: Secondary | ICD-10-CM | POA: Diagnosis not present

## 2016-10-26 DIAGNOSIS — G4733 Obstructive sleep apnea (adult) (pediatric): Secondary | ICD-10-CM | POA: Diagnosis not present

## 2016-10-29 NOTE — Pre-Procedure Instructions (Signed)
CELA NEWCOM  10/29/2016      [place], [place] - [address] [place]  [address] Phone: [phone] Fax: [phone]    Your procedure is scheduled on May 24  Report to [place] at 1215 P.M.  Call this number if you have problems the morning of surgery:  [phone]   Remember:  Do not eat food or drink liquids after midnight.   Take these medicines the morning of surgery with A SIP OF WATER Albuterol Sulfate 108 (90 Base),  buPROPion (WELLBUTRIN, citalopram (CELEXA) , famotidine (PEPCID),  ipratropium-albuterol (DUONEB), isosorbide mononitrate (IMDUR) , levothyroxine (SYNTHROID, LEVOTHROID, metoprolol (LOPRESSOR), omeprazole (PRILOSEC)  7 days prior to surgery STOP taking any celecoxib (CELEBREX)   Aspirin, Aleve, Naproxen, Ibuprofen, Motrin, Advil, Goody's, BC's, all herbal medications, fish oil, and all vitamins  BRING INHALERS WITH YOU THE DAY OF SURGERY   Do not wear jewelry, make-up or nail polish.  Do not wear lotions, powders, or perfumes, or deoderant.  Do not shave 48 hours prior to surgery.  Men may shave face and neck.  Do not bring valuables to the hospital.  [facility] is not responsible for any belongings or valuables.  Contacts, dentures or bridgework may not be worn into surgery.  Leave your suitcase in the car.  After surgery it may be brought to your room.  For patients admitted to the hospital, discharge time will be determined by your treatment team.  Patients discharged the day of surgery will not be allowed to drive home.    Special instructions:   [facility]- Preparing For Surgery  Before surgery, you can play an important role. Because skin is not sterile, your skin needs to be as free of germs as possible. You can reduce the number of germs on your skin by washing with CHG (chlorahexidine gluconate) Soap before surgery.  CHG is an antiseptic cleaner which kills germs and bonds  with the skin to continue killing germs even after washing.  Please do not use if you have an allergy to CHG or antibacterial soaps. If your skin becomes reddened/irritated stop using the CHG.  Do not shave (including legs and underarms) for at least 48 hours prior to first CHG shower. It is OK to shave your face.  Please follow these instructions carefully.   1. Shower the NIGHT BEFORE SURGERY and the MORNING OF SURGERY with CHG.   2. If you chose to wash your hair, wash your hair first as usual with your normal shampoo.  3. After you shampoo, rinse your hair and body thoroughly to remove the shampoo.  4. Use CHG as you would any other liquid soap. You can apply CHG directly to the skin and wash gently with a scrungie or a clean washcloth.   5. Apply the CHG Soap to your body ONLY FROM THE NECK DOWN.  Do not use on open wounds or open sores. Avoid contact with your eyes, ears, mouth and genitals (private parts). Wash genitals (private parts) with your normal soap.  6. Wash thoroughly, paying special attention to the area where your surgery will be performed.  7. Thoroughly rinse your body with warm water from the neck down.  8. DO NOT shower/wash with your normal soap after using and rinsing off the CHG Soap.  9. Pat yourself dry with a CLEAN TOWEL.   10. Wear CLEAN PAJAMAS   11. Place  CLEAN SHEETS on your bed the night of your first shower and DO NOT SLEEP WITH PETS.    Day of Surgery: Do not apply any deodorants/lotions. Please wear clean clothes to the hospital/surgery center.      Please read over the following fact sheets that you were given.

## 2016-10-30 ENCOUNTER — Encounter (HOSPITAL_COMMUNITY): Payer: Self-pay | Admitting: Urology

## 2016-10-30 ENCOUNTER — Encounter (HOSPITAL_COMMUNITY)
Admission: RE | Admit: 2016-10-30 | Discharge: 2016-10-30 | Disposition: A | Discharge status: Home / Self Care | Payer: Medicare Other | Source: Ambulatory Visit | Attending: Neurosurgery | Admitting: Neurosurgery

## 2016-10-30 DIAGNOSIS — Z01810 Encounter for preprocedural cardiovascular examination: Secondary | ICD-10-CM | POA: Insufficient documentation

## 2016-10-30 DIAGNOSIS — Z01812 Encounter for preprocedural laboratory examination: Secondary | ICD-10-CM | POA: Diagnosis not present

## 2016-10-30 DIAGNOSIS — I10 Essential (primary) hypertension: Secondary | ICD-10-CM | POA: Diagnosis not present

## 2016-10-30 HISTORY — DX: Unspecified osteoarthritis, unspecified site: M19.90

## 2016-10-30 HISTORY — DX: Other specified postprocedural states: R11.2

## 2016-10-30 HISTORY — DX: Sleep apnea, unspecified: G47.30

## 2016-10-30 HISTORY — DX: Cerebral infarction, unspecified: I63.9

## 2016-10-30 HISTORY — DX: Other specified postprocedural states: Z98.890

## 2016-10-30 LAB — BASIC METABOLIC PANEL
Anion gap: 6 (ref 5–15)
BUN: 20 mg/dL (ref 6–20)
CHLORIDE: 105 mmol/L (ref 101–111)
CO2: 26 mmol/L (ref 22–32)
CREATININE: 1.66 mg/dL — AB (ref 0.44–1.00)
Calcium: 8.7 mg/dL — ABNORMAL LOW (ref 8.9–10.3)
GFR calc Af Amer: 34 mL/min — ABNORMAL LOW (ref >60)
GFR calc non Af Amer: 29 mL/min — ABNORMAL LOW (ref >60)
GLUCOSE: 116 mg/dL — AB (ref 65–99)
POTASSIUM: 4.1 mmol/L (ref 3.5–5.1)
SODIUM: 137 mmol/L (ref 135–145)

## 2016-10-30 LAB — CBC
HEMATOCRIT: 39.3 % (ref 36.0–46.0)
Hemoglobin: 12.5 g/dL (ref 12.0–15.0)
MCH: 29.8 pg (ref 26.0–34.0)
MCHC: 31.8 g/dL (ref 30.0–36.0)
MCV: 93.6 fL (ref 78.0–100.0)
Platelets: 296 10*3/uL (ref 150–400)
RBC: 4.2 MIL/uL (ref 3.87–5.11)
RDW: 13.9 % (ref 11.5–15.5)
WBC: 10.7 10*3/uL — ABNORMAL HIGH (ref 4.0–10.5)

## 2016-10-30 LAB — SURGICAL PCR SCREEN
MRSA, PCR: POSITIVE — AB
Staphylococcus aureus: POSITIVE — AB

## 2016-10-30 MED ORDER — CHLORHEXIDINE GLUCONATE CLOTH 2 % EX PADS: 6.0000 | MEDICATED_PAD | Freq: Once | CUTANEOUS | Status: DC

## 2016-10-30 NOTE — Progress Notes (Addendum)
PCP - Nelda Bucks Cardiologist - Shirlee More, EKG tracing, ECHO requested from MD. Pt states she did not have to see Dr. Bettina Gavia prior to surgery.   Pt reports having a "mini stroke" in the past, but does not remember which doctor was following her for this. Pt states she does not know when she had this stroke and has no deficits from it, but an MRI showed this result.   Pt also reports possible history of reaction to blood after a miscarriage. Pt states she received blood and broke out. Pt to have T/S drawn day of surgery. Message sent to office for Dr. Adline Mango nurse regarding this.   EKG - 10/30/2016  Stress Test - 07/07/10 in epic ECHO - 03/30/15 requested from Dr. Bettina Gavia Cardiac Cath - 05/10/15 in care everywhere  Sleep Study - requested from Dr. Alcide Clever CPAP - patient wears CPAP intermittently and does not know pressure settings.   Patient denies shortness of breath, fever, cough and chest pain at PAT appointment  Patient verbalized understanding of instructions that were given to them at the PAT appointment. Patient was also instructed that they will need to review over the PAT instructions again at home before surgery.

## 2016-10-30 NOTE — Progress Notes (Signed)
PCR positive for MRSA. Prescription called to [facility] and patient notified.

## 2016-10-31 ENCOUNTER — Encounter (HOSPITAL_COMMUNITY): Payer: Self-pay

## 2016-10-31 NOTE — Progress Notes (Addendum)
Anesthesia Chart Review: Patient is a 76 year old female scheduled for L5-S1 PLIF, interbody prosthesis, posterior lateral arthrodesis on 11/07/2016 by Dr. Arnoldo Morale.  History includes never smoker, postoperative nausea vomiting, CAD s/p PCI (per Dr. Delena Bali 07/20/16 note: history of LAD and RCA stents; only mild CAD by 05/10/15 [facility]), chronic diastolic CHF, hypertension, hyperlipidemia, GERD, hypothyroidism, OSA (CPAP, intermittent use), CKD stage III, anemia, CVA (asymptomatic; reported as incidental finding on MRI), depression, arthritis, hysterectomy, cholecystectomy, appendectomy. She reported a history of reaction to blood (rash) following a miscarriage (PAT RN notified Dr. Arnoldo Morale' office). BMI is consistent with obesity.  - PCP is Dr. Nelda Bucks. 07/20/16 office note is scanned under the Media tab. - Cardiologist is Dr. Shirlee More with [facility] (see Care Everywhere), last visit 05/07/16. He signed a note stating, "Proceed with surgery." - Pulmonologist is Dr. Baltazar Apo, last visit 10/21/16. He wrote, "No evidence for airflow obstruction on her pulmonary function testing. I suspect that her dyspnea is due in part to deconditioning. She also has some upper airway irritation that waxes and wanes. No evidence to support asthma. I do not believe she is high risk for surgery. She is considering back surgery and should be a suitable candidate for this." He did not feel that currently she was on reliable therapy for OSA and was requested she get nasal pillows with her CPAP. (Reported previous sleep study by Dr. Alcide Clever.) - Neurologist is Dr. Charlaine Dalton with [facility] (see Care Everywhere), last visit 08/08/15.  Meds include albuterol, aspirin 81 mg, Lipitor, Wellbutrin, Celebrex, Celexa, Pepcid, Flonase, Lasix, DuoNeb, Imdur, levothyroxine, Lopressor, Prilosec, KCl, Seroquel, turmeric.  BP (!) 142/84   Pulse 60   Temp 36.6 C   Resp 20   Ht 5' 2.5" (1.588 m)    Wt 216 lb 9.6 oz (98.2 kg)   LMP  (LMP Unknown)   SpO2 95%   BMI 38.99 kg/m   EKG 10/30/16: SR with first degree AV block.  Cardiac cath 05/10/15:   Angiographic findings  Cardiac Arteries and Lesion Findings LMCA: Normal. LAD: Normal. LCx: Normal. RCA: Abnormal. Lesion on R PDA: Ostial.35% stenosis 5 mm length . Pre procedure TIMI III flow was noted. Good run off was present.Bifurcation lesion. Conclusions Diagnostic Procedure Summary Mild non-obstructive coronary artery disease. Normal LV function, EF 60% Diagnostic Procedure Recommendations Recommend work up for non-cardiac causes of symptoms.  Echo 03/30/15 [facility] Cardiology): Conclusion: Normal left ventricular size with prominent basal "sigmoid" septum, normal systolic function (EF 09%), no segmental abnormality. Minimal aortic sclerosis. Mild mitral regurgitation, no structural abnormality, normal left atrial size. Normal right heart size/function, only trace TR.  PFTs 10/21/16: FVC 2.20 (pre 83%, post 85%), FEV1 1.78 (pre 89%, post 93%), DLCO unc 16.41 (73%).   Preoperative labs noted. BUN 20, Cr 1.66 (comparison labs in Care Everywhere show Cr 1.41 12/08/15, 1.44 05/02/15, Cr 2.09 02/13/15; last BUN 23/Cr 1.50, eGFR 34 by 07/20/06 note by Dr. Delena Bali). eGFR 29. H/H 05/21/38.3.   She denied SOB, CP, cough, and fever at PAT. She has pre-operative cardiology and pulmonology input. Renal function will need to be monitored post-operatively. Known history of CKD stage III. If no acute changes then I anticipate that she can proceed as planned.   George [facility] Stay Center/Anesthesiology Phone [phone] 10/31/2016 12:18 PM

## 2016-11-07 ENCOUNTER — Encounter (HOSPITAL_COMMUNITY): Payer: Self-pay | Admitting: Certified Registered Nurse Anesthetist

## 2016-11-07 ENCOUNTER — Inpatient Hospital Stay (HOSPITAL_COMMUNITY): Payer: Medicare Other | Admitting: Certified Registered Nurse Anesthetist

## 2016-11-07 ENCOUNTER — Inpatient Hospital Stay (HOSPITAL_COMMUNITY): Payer: Medicare Other

## 2016-11-07 ENCOUNTER — Inpatient Hospital Stay (HOSPITAL_COMMUNITY)
Admission: RE | Disposition: A | Discharge status: Home Health | Payer: Self-pay | Source: Ambulatory Visit | Attending: Neurosurgery

## 2016-11-07 ENCOUNTER — Inpatient Hospital Stay (HOSPITAL_COMMUNITY)
Admission: RE | Admit: 2016-11-07 | Discharge: 2016-11-10 | DRG: 455 | Disposition: A | Discharge status: Home Health | Payer: Medicare Other | Source: Ambulatory Visit | Attending: Neurosurgery | Admitting: Neurosurgery

## 2016-11-07 ENCOUNTER — Inpatient Hospital Stay (HOSPITAL_COMMUNITY): Payer: Medicare Other | Admitting: Vascular Surgery

## 2016-11-07 DIAGNOSIS — M4807 Spinal stenosis, lumbosacral region: Secondary | ICD-10-CM | POA: Diagnosis not present

## 2016-11-07 DIAGNOSIS — I2510 Atherosclerotic heart disease of native coronary artery without angina pectoris: Secondary | ICD-10-CM | POA: Diagnosis not present

## 2016-11-07 DIAGNOSIS — M5116 Intervertebral disc disorders with radiculopathy, lumbar region: Secondary | ICD-10-CM | POA: Diagnosis not present

## 2016-11-07 DIAGNOSIS — Z6838 Body mass index (BMI) 38.0-38.9, adult: Secondary | ICD-10-CM | POA: Diagnosis not present

## 2016-11-07 DIAGNOSIS — I10 Essential (primary) hypertension: Secondary | ICD-10-CM | POA: Diagnosis present

## 2016-11-07 DIAGNOSIS — M48061 Spinal stenosis, lumbar region without neurogenic claudication: Secondary | ICD-10-CM | POA: Diagnosis not present

## 2016-11-07 DIAGNOSIS — M1990 Unspecified osteoarthritis, unspecified site: Secondary | ICD-10-CM | POA: Diagnosis present

## 2016-11-07 DIAGNOSIS — K219 Gastro-esophageal reflux disease without esophagitis: Secondary | ICD-10-CM | POA: Diagnosis present

## 2016-11-07 DIAGNOSIS — D649 Anemia, unspecified: Secondary | ICD-10-CM | POA: Diagnosis not present

## 2016-11-07 DIAGNOSIS — E039 Hypothyroidism, unspecified: Secondary | ICD-10-CM | POA: Diagnosis not present

## 2016-11-07 DIAGNOSIS — R269 Unspecified abnormalities of gait and mobility: Secondary | ICD-10-CM | POA: Diagnosis not present

## 2016-11-07 DIAGNOSIS — M5117 Intervertebral disc disorders with radiculopathy, lumbosacral region: Secondary | ICD-10-CM | POA: Diagnosis not present

## 2016-11-07 DIAGNOSIS — R739 Hyperglycemia, unspecified: Secondary | ICD-10-CM | POA: Diagnosis present

## 2016-11-07 DIAGNOSIS — M4317 Spondylolisthesis, lumbosacral region: Principal | ICD-10-CM | POA: Diagnosis present

## 2016-11-07 DIAGNOSIS — G4730 Sleep apnea, unspecified: Secondary | ICD-10-CM | POA: Diagnosis present

## 2016-11-07 DIAGNOSIS — Z419 Encounter for procedure for purposes other than remedying health state, unspecified: Secondary | ICD-10-CM

## 2016-11-07 DIAGNOSIS — M4326 Fusion of spine, lumbar region: Secondary | ICD-10-CM | POA: Diagnosis not present

## 2016-11-07 HISTORY — DX: Spondylolisthesis, lumbosacral region: M43.17

## 2016-11-07 HISTORY — PX: BACK SURGERY: SHX140

## 2016-11-07 LAB — TYPE AND SCREEN
ABO/RH(D): A POS
Antibody Screen: NEGATIVE

## 2016-11-07 LAB — ABO/RH: ABO/RH(D): A POS

## 2016-11-07 SURGERY — POSTERIOR LUMBAR FUSION 1 LEVEL
Anesthesia: General | Site: Spine Lumbar

## 2016-11-07 MED ORDER — PROPOFOL 10 MG/ML IV BOLUS
INTRAVENOUS | Status: DC | PRN
  Administered 2016-11-07: 150 mg via INTRAVENOUS

## 2016-11-07 MED ORDER — DEXAMETHASONE SODIUM PHOSPHATE 10 MG/ML IJ SOLN
INTRAMUSCULAR | Status: AC
  Filled 2016-11-07: qty 1

## 2016-11-07 MED ORDER — HYDROMORPHONE HCL 1 MG/ML IJ SOLN
0.2500 mg | INTRAMUSCULAR | Status: DC | PRN
  Administered 2016-11-07 (×2): 0.5 mg via INTRAVENOUS

## 2016-11-07 MED ORDER — HYDROMORPHONE HCL 1 MG/ML IJ SOLN
INTRAMUSCULAR | Status: AC
  Filled 2016-11-07: qty 0.5

## 2016-11-07 MED ORDER — ACETAMINOPHEN 325 MG PO TABS: 650.0000 mg | ORAL_TABLET | ORAL | Status: DC | PRN

## 2016-11-07 MED ORDER — ALBUTEROL SULFATE (2.5 MG/3ML) 0.083% IN NEBU: 2.5000 mg | INHALATION_SOLUTION | Freq: Four times a day (QID) | RESPIRATORY_TRACT | Status: DC | PRN

## 2016-11-07 MED ORDER — MENTHOL 3 MG MT LOZG: 1.0000 | LOZENGE | OROMUCOSAL | Status: DC | PRN

## 2016-11-07 MED ORDER — BUPROPION HCL ER (SR) 150 MG PO TB12
150.0000 mg | ORAL_TABLET | Freq: Every day | ORAL | Status: DC
  Administered 2016-11-07 – 2016-11-10 (×4): 150 mg via ORAL
  Filled 2016-11-07 (×4): qty 1

## 2016-11-07 MED ORDER — SODIUM CHLORIDE 0.9 % IR SOLN
Status: DC | PRN
  Administered 2016-11-07: 17:00:00

## 2016-11-07 MED ORDER — ONDANSETRON HCL 4 MG/2ML IJ SOLN
INTRAMUSCULAR | Status: AC
  Filled 2016-11-07: qty 2

## 2016-11-07 MED ORDER — SUGAMMADEX SODIUM 200 MG/2ML IV SOLN
INTRAVENOUS | Status: DC | PRN
  Administered 2016-11-07: 200 mg via INTRAVENOUS

## 2016-11-07 MED ORDER — MORPHINE SULFATE (PF) 4 MG/ML IV SOLN
4.0000 mg | INTRAVENOUS | Status: DC | PRN
  Administered 2016-11-08 – 2016-11-09 (×5): 4 mg via INTRAVENOUS
  Filled 2016-11-07 (×5): qty 1

## 2016-11-07 MED ORDER — ZOLPIDEM TARTRATE 5 MG PO TABS: 5.0000 mg | ORAL_TABLET | Freq: Every evening | ORAL | Status: DC | PRN

## 2016-11-07 MED ORDER — IPRATROPIUM-ALBUTEROL 20-100 MCG/ACT IN AERS
INHALATION_SPRAY | RESPIRATORY_TRACT | Status: DC | PRN
  Administered 2016-11-07: 3 via RESPIRATORY_TRACT

## 2016-11-07 MED ORDER — BUPIVACAINE-EPINEPHRINE (PF) 0.5% -1:200000 IJ SOLN
INTRAMUSCULAR | Status: DC | PRN
  Administered 2016-11-07: 10 mL

## 2016-11-07 MED ORDER — CYCLOBENZAPRINE HCL 10 MG PO TABS
10.0000 mg | ORAL_TABLET | Freq: Three times a day (TID) | ORAL | Status: DC | PRN
  Administered 2016-11-08 – 2016-11-10 (×5): 10 mg via ORAL
  Filled 2016-11-07 (×5): qty 1

## 2016-11-07 MED ORDER — FENTANYL CITRATE (PF) 250 MCG/5ML IJ SOLN
INTRAMUSCULAR | Status: AC
  Filled 2016-11-07: qty 5

## 2016-11-07 MED ORDER — CEFAZOLIN SODIUM-DEXTROSE 2-4 GM/100ML-% IV SOLN
2.0000 g | INTRAVENOUS | Status: AC
  Administered 2016-11-07: 2 g via INTRAVENOUS

## 2016-11-07 MED ORDER — MUPIROCIN 2 % EX OINT
1.0000 | TOPICAL_OINTMENT | Freq: Two times a day (BID) | CUTANEOUS | Status: DC
Start: 2016-11-07 — End: 2016-11-07
  Filled 2016-11-07: qty 22

## 2016-11-07 MED ORDER — PHENYLEPHRINE HCL 10 MG/ML IJ SOLN
INTRAVENOUS | Status: DC | PRN
  Administered 2016-11-07: 20 ug/min via INTRAVENOUS

## 2016-11-07 MED ORDER — THROMBIN 20000 UNITS EX SOLR
CUTANEOUS | Status: AC
  Filled 2016-11-07: qty 20000

## 2016-11-07 MED ORDER — POTASSIUM CHLORIDE CRYS ER 20 MEQ PO TBCR
40.0000 meq | EXTENDED_RELEASE_TABLET | Freq: Every day | ORAL | Status: DC
  Administered 2016-11-07 – 2016-11-10 (×4): 40 meq via ORAL
  Filled 2016-11-07 (×4): qty 2

## 2016-11-07 MED ORDER — BACITRACIN ZINC 500 UNIT/GM EX OINT
TOPICAL_OINTMENT | CUTANEOUS | Status: AC
  Filled 2016-11-07: qty 28.35

## 2016-11-07 MED ORDER — NEOSTIGMINE METHYLSULFATE 5 MG/5ML IV SOSY
PREFILLED_SYRINGE | INTRAVENOUS | Status: AC
  Filled 2016-11-07: qty 5

## 2016-11-07 MED ORDER — FLUTICASONE PROPIONATE 50 MCG/ACT NA SUSP
2.0000 | Freq: Every day | NASAL | Status: DC
  Administered 2016-11-08 – 2016-11-10 (×3): 2 via NASAL
  Filled 2016-11-07: qty 16

## 2016-11-07 MED ORDER — MIDAZOLAM HCL 2 MG/2ML IJ SOLN
INTRAMUSCULAR | Status: AC
  Filled 2016-11-07: qty 2

## 2016-11-07 MED ORDER — BACITRACIN ZINC 500 UNIT/GM EX OINT
TOPICAL_OINTMENT | CUTANEOUS | Status: DC | PRN
  Administered 2016-11-07: 1 via TOPICAL

## 2016-11-07 MED ORDER — FENTANYL CITRATE (PF) 100 MCG/2ML IJ SOLN
INTRAMUSCULAR | Status: DC | PRN
  Administered 2016-11-07 (×3): 50 ug via INTRAVENOUS
  Administered 2016-11-07 (×2): 100 ug via INTRAVENOUS

## 2016-11-07 MED ORDER — SUGAMMADEX SODIUM 200 MG/2ML IV SOLN
INTRAVENOUS | Status: AC
  Filled 2016-11-07: qty 2

## 2016-11-07 MED ORDER — LIDOCAINE 2% (20 MG/ML) 5 ML SYRINGE
INTRAMUSCULAR | Status: AC
  Filled 2016-11-07: qty 5

## 2016-11-07 MED ORDER — DEXTROMETHORPHAN-GUAIFENESIN 10-100 MG/5ML PO LIQD
5.0000 mL | Freq: Every day | ORAL | Status: DC
  Administered 2016-11-07 – 2016-11-09 (×3): 5 mL via ORAL
  Filled 2016-11-07 (×3): qty 5

## 2016-11-07 MED ORDER — ROCURONIUM BROMIDE 10 MG/ML (PF) SYRINGE
PREFILLED_SYRINGE | INTRAVENOUS | Status: DC | PRN
  Administered 2016-11-07: 30 mg via INTRAVENOUS
  Administered 2016-11-07: 50 mg via INTRAVENOUS

## 2016-11-07 MED ORDER — CITALOPRAM HYDROBROMIDE 40 MG PO TABS
40.0000 mg | ORAL_TABLET | Freq: Every day | ORAL | Status: DC
  Administered 2016-11-07 – 2016-11-10 (×4): 40 mg via ORAL
  Filled 2016-11-07 (×4): qty 1

## 2016-11-07 MED ORDER — CEFAZOLIN SODIUM-DEXTROSE 2-4 GM/100ML-% IV SOLN
2.0000 g | Freq: Three times a day (TID) | INTRAVENOUS | Status: AC
  Administered 2016-11-07: 2 g via INTRAVENOUS
  Filled 2016-11-07 (×2): qty 100

## 2016-11-07 MED ORDER — CHLORHEXIDINE GLUCONATE CLOTH 2 % EX PADS: 6.0000 | MEDICATED_PAD | Freq: Every day | CUTANEOUS | Status: DC

## 2016-11-07 MED ORDER — CHLORHEXIDINE GLUCONATE 0.12 % MT SOLN
15.0000 mL | Freq: Two times a day (BID) | OROMUCOSAL | Status: DC
  Administered 2016-11-08 – 2016-11-09 (×4): 15 mL via OROMUCOSAL
  Filled 2016-11-07 (×3): qty 15

## 2016-11-07 MED ORDER — VANCOMYCIN HCL 1000 MG IV SOLR
INTRAVENOUS | Status: AC
  Filled 2016-11-07: qty 1000

## 2016-11-07 MED ORDER — ISOSORBIDE MONONITRATE ER 60 MG PO TB24
120.0000 mg | ORAL_TABLET | Freq: Every day | ORAL | Status: DC
  Administered 2016-11-07 – 2016-11-10 (×4): 120 mg via ORAL
  Filled 2016-11-07 (×4): qty 2

## 2016-11-07 MED ORDER — ONDANSETRON HCL 4 MG/2ML IJ SOLN
INTRAMUSCULAR | Status: DC | PRN
  Administered 2016-11-07: 4 mg via INTRAVENOUS

## 2016-11-07 MED ORDER — BUPIVACAINE LIPOSOME 1.3 % IJ SUSP
20.0000 mL | INTRAMUSCULAR | Status: AC
  Filled 2016-11-07: qty 20

## 2016-11-07 MED ORDER — VANCOMYCIN HCL 1000 MG IV SOLR
INTRAVENOUS | Status: DC | PRN
  Administered 2016-11-07: 1000 mg via TOPICAL

## 2016-11-07 MED ORDER — LIDOCAINE 2% (20 MG/ML) 5 ML SYRINGE
INTRAMUSCULAR | Status: DC | PRN
  Administered 2016-11-07: 60 mg via INTRAVENOUS

## 2016-11-07 MED ORDER — THROMBIN 5000 UNITS EX SOLR
CUTANEOUS | Status: AC
  Filled 2016-11-07: qty 5000

## 2016-11-07 MED ORDER — PHENYLEPHRINE 40 MCG/ML (10ML) SYRINGE FOR IV PUSH (FOR BLOOD PRESSURE SUPPORT)
PREFILLED_SYRINGE | INTRAVENOUS | Status: AC
  Filled 2016-11-07: qty 10

## 2016-11-07 MED ORDER — PHENYLEPHRINE HCL 10 MG/ML IJ SOLN
INTRAMUSCULAR | Status: DC | PRN
  Administered 2016-11-07: 80 ug via INTRAVENOUS

## 2016-11-07 MED ORDER — BISACODYL 10 MG RE SUPP: 10.0000 mg | Freq: Every day | RECTAL | Status: DC | PRN

## 2016-11-07 MED ORDER — IPRATROPIUM-ALBUTEROL 0.5-2.5 (3) MG/3ML IN SOLN: 3.0000 mL | Freq: Two times a day (BID) | RESPIRATORY_TRACT | Status: DC | PRN

## 2016-11-07 MED ORDER — THROMBIN 5000 UNITS EX SOLR
OROMUCOSAL | Status: DC | PRN
  Administered 2016-11-07: 17:00:00 via TOPICAL

## 2016-11-07 MED ORDER — SODIUM CHLORIDE 0.9% FLUSH
3.0000 mL | Freq: Two times a day (BID) | INTRAVENOUS | Status: DC
  Administered 2016-11-08 – 2016-11-10 (×5): 3 mL via INTRAVENOUS

## 2016-11-07 MED ORDER — CEFAZOLIN SODIUM-DEXTROSE 2-4 GM/100ML-% IV SOLN
INTRAVENOUS | Status: AC
  Filled 2016-11-07: qty 100

## 2016-11-07 MED ORDER — LACTATED RINGERS IV SOLN
INTRAVENOUS | Status: DC | PRN
Start: 2016-11-07 — End: 2016-11-07
  Administered 2016-11-07 (×3): via INTRAVENOUS

## 2016-11-07 MED ORDER — SODIUM CHLORIDE 0.9 % IV SOLN
250.0000 mL | INTRAVENOUS | Status: DC
  Administered 2016-11-07: 250 mL via INTRAVENOUS

## 2016-11-07 MED ORDER — QUETIAPINE FUMARATE 50 MG PO TABS
75.0000 mg | ORAL_TABLET | Freq: Every day | ORAL | Status: DC
  Administered 2016-11-07 – 2016-11-09 (×3): 75 mg via ORAL
  Filled 2016-11-07 (×3): qty 1

## 2016-11-07 MED ORDER — PHENOL 1.4 % MT LIQD: 1.0000 | OROMUCOSAL | Status: DC | PRN

## 2016-11-07 MED ORDER — ONDANSETRON HCL 4 MG/2ML IJ SOLN: 4.0000 mg | Freq: Four times a day (QID) | INTRAMUSCULAR | Status: DC | PRN

## 2016-11-07 MED ORDER — DEXAMETHASONE SODIUM PHOSPHATE 4 MG/ML IJ SOLN
INTRAMUSCULAR | Status: DC | PRN
  Administered 2016-11-07: 10 mg via INTRAVENOUS

## 2016-11-07 MED ORDER — ATORVASTATIN CALCIUM 40 MG PO TABS
80.0000 mg | ORAL_TABLET | Freq: Every day | ORAL | Status: DC
  Administered 2016-11-07 – 2016-11-09 (×3): 80 mg via ORAL
  Filled 2016-11-07 (×3): qty 2

## 2016-11-07 MED ORDER — PANTOPRAZOLE SODIUM 40 MG PO TBEC
40.0000 mg | DELAYED_RELEASE_TABLET | Freq: Every day | ORAL | Status: DC
  Administered 2016-11-08 – 2016-11-10 (×3): 40 mg via ORAL
  Filled 2016-11-07 (×3): qty 1

## 2016-11-07 MED ORDER — SODIUM CHLORIDE 0.9% FLUSH: 3.0000 mL | INTRAVENOUS | Status: DC | PRN

## 2016-11-07 MED ORDER — PROPOFOL 10 MG/ML IV BOLUS
INTRAVENOUS | Status: AC
  Filled 2016-11-07: qty 20

## 2016-11-07 MED ORDER — ALBUTEROL SULFATE (2.5 MG/3ML) 0.083% IN NEBU
INHALATION_SOLUTION | RESPIRATORY_TRACT | Status: AC
  Administered 2016-11-07: 2.5 mg
  Filled 2016-11-07: qty 3

## 2016-11-07 MED ORDER — THROMBIN 20000 UNITS EX SOLR
CUTANEOUS | Status: DC | PRN
  Administered 2016-11-07: 17:00:00 via TOPICAL

## 2016-11-07 MED ORDER — FUROSEMIDE 80 MG PO TABS
80.0000 mg | ORAL_TABLET | Freq: Two times a day (BID) | ORAL | Status: DC
  Administered 2016-11-07 – 2016-11-10 (×6): 80 mg via ORAL
  Filled 2016-11-07 (×6): qty 1

## 2016-11-07 MED ORDER — ONDANSETRON HCL 4 MG PO TABS: 4.0000 mg | ORAL_TABLET | Freq: Four times a day (QID) | ORAL | Status: DC | PRN

## 2016-11-07 MED ORDER — DOCUSATE SODIUM 100 MG PO CAPS
100.0000 mg | ORAL_CAPSULE | Freq: Two times a day (BID) | ORAL | Status: DC
  Administered 2016-11-07 – 2016-11-10 (×6): 100 mg via ORAL
  Filled 2016-11-07 (×6): qty 1

## 2016-11-07 MED ORDER — FAMOTIDINE 20 MG PO TABS
40.0000 mg | ORAL_TABLET | Freq: Every day | ORAL | Status: DC
  Administered 2016-11-07 – 2016-11-10 (×4): 40 mg via ORAL
  Filled 2016-11-07 (×4): qty 2

## 2016-11-07 MED ORDER — METOPROLOL TARTRATE 50 MG PO TABS
50.0000 mg | ORAL_TABLET | Freq: Every day | ORAL | Status: DC
  Administered 2016-11-08 – 2016-11-10 (×3): 50 mg via ORAL
  Filled 2016-11-07 (×3): qty 1

## 2016-11-07 MED ORDER — ROCURONIUM BROMIDE 10 MG/ML (PF) SYRINGE
PREFILLED_SYRINGE | INTRAVENOUS | Status: AC
  Filled 2016-11-07: qty 5

## 2016-11-07 MED ORDER — CEFAZOLIN SODIUM 1 G IJ SOLR
INTRAMUSCULAR | Status: AC
  Filled 2016-11-07: qty 20

## 2016-11-07 MED ORDER — LEVOTHYROXINE SODIUM 50 MCG PO TABS
50.0000 ug | ORAL_TABLET | Freq: Every day | ORAL | Status: DC
  Administered 2016-11-08 – 2016-11-10 (×3): 50 ug via ORAL
  Filled 2016-11-07 (×3): qty 1

## 2016-11-07 MED ORDER — ACETAMINOPHEN 650 MG RE SUPP: 650.0000 mg | RECTAL | Status: DC | PRN

## 2016-11-07 SURGICAL SUPPLY — 61 items
BAG DECANTER FOR FLEXI CONT (MISCELLANEOUS) ×3 IMPLANT
BENZOIN TINCTURE PRP APPL 2/3 (GAUZE/BANDAGES/DRESSINGS) ×3 IMPLANT
BLADE CLIPPER SURG (BLADE) IMPLANT
BUR MATCHSTICK NEURO 3.0 LAGG (BURR) ×3 IMPLANT
BUR PRECISION FLUTE 6.0 (BURR) ×3 IMPLANT
CANISTER SUCT 3000ML PPV (MISCELLANEOUS) ×3 IMPLANT
CAP REVERE LOCKING (Cap) ×12 IMPLANT
CARTRIDGE OIL MAESTRO DRILL (MISCELLANEOUS) ×1 IMPLANT
CLOSURE WOUND 1/2 X4 (GAUZE/BANDAGES/DRESSINGS) ×1
CONT SPEC 4OZ CLIKSEAL STRL BL (MISCELLANEOUS) ×3 IMPLANT
COVER BACK TABLE 60X90IN (DRAPES) ×6 IMPLANT
DIFFUSER DRILL AIR PNEUMATIC (MISCELLANEOUS) ×3 IMPLANT
DRAPE C-ARM 42X72 X-RAY (DRAPES) ×6 IMPLANT
DRAPE HALF SHEET 40X57 (DRAPES) ×3 IMPLANT
DRAPE LAPAROTOMY 100X72X124 (DRAPES) ×3 IMPLANT
DRAPE POUCH INSTRU U-SHP 10X18 (DRAPES) ×3 IMPLANT
DRAPE SURG 17X23 STRL (DRAPES) ×12 IMPLANT
ELECT BLADE 4.0 EZ CLEAN MEGAD (MISCELLANEOUS) ×3
ELECT REM PT RETURN 9FT ADLT (ELECTROSURGICAL) ×3
ELECTRODE BLDE 4.0 EZ CLN MEGD (MISCELLANEOUS) ×1 IMPLANT
ELECTRODE REM PT RTRN 9FT ADLT (ELECTROSURGICAL) ×1 IMPLANT
EVACUATOR 1/8 PVC DRAIN (DRAIN) IMPLANT
GAUZE SPONGE 4X4 12PLY STRL (GAUZE/BANDAGES/DRESSINGS) ×3 IMPLANT
GAUZE SPONGE 4X4 16PLY XRAY LF (GAUZE/BANDAGES/DRESSINGS) ×3 IMPLANT
GLOVE BIO SURGEON STRL SZ8 (GLOVE) ×6 IMPLANT
GLOVE BIO SURGEON STRL SZ8.5 (GLOVE) ×6 IMPLANT
GLOVE EXAM NITRILE LRG STRL (GLOVE) IMPLANT
GLOVE EXAM NITRILE XL STR (GLOVE) IMPLANT
GLOVE EXAM NITRILE XS STR PU (GLOVE) IMPLANT
GOWN STRL REUS W/ TWL LRG LVL3 (GOWN DISPOSABLE) IMPLANT
GOWN STRL REUS W/ TWL XL LVL3 (GOWN DISPOSABLE) ×2 IMPLANT
GOWN STRL REUS W/TWL 2XL LVL3 (GOWN DISPOSABLE) IMPLANT
GOWN STRL REUS W/TWL LRG LVL3 (GOWN DISPOSABLE)
GOWN STRL REUS W/TWL XL LVL3 (GOWN DISPOSABLE) ×4
HEMOSTAT POWDER KIT SURGIFOAM (HEMOSTASIS) ×3 IMPLANT
KIT BASIN OR (CUSTOM PROCEDURE TRAY) ×3 IMPLANT
KIT ROOM TURNOVER OR (KITS) ×3 IMPLANT
NEEDLE HYPO 21X1.5 SAFETY (NEEDLE) IMPLANT
NEEDLE HYPO 22GX1.5 SAFETY (NEEDLE) ×3 IMPLANT
NS IRRIG 1000ML POUR BTL (IV SOLUTION) ×3 IMPLANT
OIL CARTRIDGE MAESTRO DRILL (MISCELLANEOUS) ×3
PACK LAMINECTOMY NEURO (CUSTOM PROCEDURE TRAY) ×3 IMPLANT
PAD ARMBOARD 7.5X6 YLW CONV (MISCELLANEOUS) ×9 IMPLANT
PATTIES SURGICAL .5 X1 (DISPOSABLE) IMPLANT
ROD REVERE 6.35 40MM (Rod) ×6 IMPLANT
SCREW REVERE 6.35 6.5MMX45 (Screw) ×3 IMPLANT
SCREW REVERE 6.35 6.5X40MM (Screw) ×9 IMPLANT
SPACER ALTERA 10X31 8-12MM-8 (Spacer) ×3 IMPLANT
SPONGE LAP 4X18 X RAY DECT (DISPOSABLE) IMPLANT
SPONGE NEURO XRAY DETECT 1X3 (DISPOSABLE) IMPLANT
SPONGE SURGIFOAM ABS GEL 100 (HEMOSTASIS) ×3 IMPLANT
STRIP BIOACTIVE 20CC 25X100X8 (Miscellaneous) ×3 IMPLANT
STRIP CLOSURE SKIN 1/2X4 (GAUZE/BANDAGES/DRESSINGS) ×2 IMPLANT
SUT VIC AB 1 CT1 18XBRD ANBCTR (SUTURE) ×2 IMPLANT
SUT VIC AB 1 CT1 8-18 (SUTURE) ×4
SUT VIC AB 2-0 CP2 18 (SUTURE) ×6 IMPLANT
TAPE CLOTH SURG 4X10 WHT LF (GAUZE/BANDAGES/DRESSINGS) ×3 IMPLANT
TOWEL GREEN STERILE (TOWEL DISPOSABLE) ×2 IMPLANT
TOWEL GREEN STERILE FF (TOWEL DISPOSABLE) ×3 IMPLANT
TRAY FOLEY W/METER SILVER 16FR (SET/KITS/TRAYS/PACK) ×3 IMPLANT
WATER STERILE IRR 1000ML POUR (IV SOLUTION) ×3 IMPLANT

## 2016-11-07 NOTE — Progress Notes (Signed)
11/07/2016- Respiratory care note- Pt placed on CPAP for the night with a nasal mask and tolerating well.

## 2016-11-07 NOTE — Anesthesia Postprocedure Evaluation (Addendum)
Anesthesia Post Note  Patient: Jody Taylor  Procedure(s) Performed: Procedure(s) (LRB): POSTERIOR LUMBAR INTERBODY FUSION, INTERBODY PROSTHESIS, POSTERIOR LATERAL ARTHRODESIS, POSTERIOR NON-SEGMENTAL INSTRUMENTATION LUMBAR FIVE - SACRAL ONE (N/A)  Patient location during evaluation: PACU Anesthesia Type: General Level of consciousness: awake and alert Pain management: pain level controlled Vital Signs Assessment: post-procedure vital signs reviewed and stable Respiratory status: spontaneous breathing, nonlabored ventilation, respiratory function stable and patient connected to nasal cannula oxygen Cardiovascular status: blood pressure returned to baseline and stable Postop Assessment: no signs of nausea or vomiting Anesthetic complications: no       Last Vitals:  Vitals:   11/07/16 1930 11/07/16 2010  BP:  (!) 159/95  Pulse: 72 81  Resp: (!) 24 20  Temp:  37.3 C    Last Pain:  Vitals:   11/07/16 2010  TempSrc: Oral  PainSc:                  Keyoni Lapinski

## 2016-11-07 NOTE — Op Note (Signed)
Brief history: The patient is a 76 year old white female who has complained of back and left leg pain consistent with a lumbar radiculopathy. She has failed medical management and was worked up with a lumbar MRI and lumbar x-rays. This demonstrated multilevel degenerative changes with an L5-S1 spondylolisthesis and severe foraminal stenosis at L5-S1 on the left. I discussed the various treatment options with the patient including surgery. She has weighed the risks, benefits, and alternatives to surgery and decided proceed with an L5-S1 decompression, instrumentation, and fusion.  Preoperative diagnosis: L5-S1 spondylolisthesis, Degenerative disc disease, spinal stenosis compressing both the L5 and the S1 nerve roots; lumbago; lumbar radiculopathy  Postoperative diagnosis: The same  Procedure: Bilateral L5-S1 Laminotomy/foraminotomies to decompress the bilateral L5 and S1 nerve roots(the work required to do this was in addition to the work required to do the posterior lumbar interbody fusion because of the patient's spinal stenosis, facet arthropathy. Etc. requiring a wide decompression of the nerve roots.); L5-S1 transforaminal lumbar interbody fusion with local morselized autograft bone and Kinnex graft extender; insertion of interbody prosthesis at L5-S1 (globus peek expandable interbody prosthesis); posterior nonsegmental instrumentation from L5 to S1 with globus titanium pedicle screws and rods; posterior lateral arthrodesis at L5-S1 with local morselized autograft bone and Kinnex bone graft extender.  Surgeon: Dr. Earle Gell  Asst.: Dr. Vertell Limber  Anesthesia: Gen. endotracheal  Estimated blood loss: 200 mL  Drains: None  Complications: None  Description of procedure: The patient was brought to the operating room by the anesthesia team. General endotracheal anesthesia was induced. The patient was turned to the prone position on the Wilson frame. The patient's lumbosacral region was then  prepared with Betadine scrub and Betadine solution. Sterile drapes were applied.  I then injected the area to be incised with Marcaine with epinephrine solution. I then used the scalpel to make a linear midline incision over the L5-S1 interspace. I then used electrocautery to perform a bilateral subperiosteal dissection exposing the spinous process and lamina of L4, L5 and the upper sacrum. We then obtained intraoperative radiograph to confirm our location. We then inserted the Verstrac retractor to provide exposure.  I began the decompression by using the high speed drill to perform laminotomies at L5-S1 bilaterally. We then used the Kerrison punches to widen the laminotomy and removed the ligamentum flavum at L5-S1 bilaterally. We used the Kerrison punches to remove the medial facets at L5-S1 bilaterally. We performed wide foraminotomies about the bilateral L5 and S1 nerve roots completing the decompression.  We now turned our attention to the posterior lumbar interbody fusion. I used a scalpel to incise the intervertebral disc at L5-S1 bilaterally. I then performed a partial intervertebral discectomy at L5-S1 bilaterally using the pituitary forceps. We prepared the vertebral endplates at H8-I5 bilaterally for the fusion by removing the soft tissues with the curettes. We then used the trial spacers to pick the appropriate sized interbody prosthesis. We prefilled his prosthesis with a combination of local morselized autograft bone that we obtained during the decompression as well as Kinnex bone graft extender. We inserted the prefilled prosthesis into the interspace at L5-S1 from the left, we then expanded the prosthesis. There was a good snug fit of the prosthesis in the interspace. We then filled and the remainder of the intervertebral disc space with local morselized autograft bone and Kinnex. This completed the posterior lumbar interbody arthrodesis.  We now turned attention to the instrumentation.  Under fluoroscopic guidance we cannulated the bilateral L5 and S1 pedicles with the  bone probe. We then removed the bone probe. We then tapped the pedicle with a 5.5 millimeter tap. We then removed the tap. We probed inside the tapped pedicle with a ball probe to rule out cortical breaches. We then inserted a 6.5 x 45 and 40 millimeter pedicle screw into the L5 and S1 pedicles bilaterally under fluoroscopic guidance. We then palpated along the medial aspect of the pedicles to rule out cortical breaches. There were none. The nerve roots were not injured. We then connected the unilateral pedicle screws with a lordotic rod. We compressed the construct and secured the rod in place with the caps. We then tightened the caps appropriately. This completed the instrumentation from L5-S1.  We now turned our attention to the posterior lateral arthrodesis at L5-S1 bilaterally. We used the high-speed drill to decorticate the remainder of the facets, pars, transverse process at L5-S1 bilaterally. We then applied a combination of local morselized autograft bone and Kinnex bone graft extender over these decorticated posterior lateral structures. This completed the posterior lateral arthrodesis.  We then obtained hemostasis using bipolar electrocautery. We irrigated the wound out with bacitracin solution. We inspected the thecal sac and nerve roots and noted they were well decompressed. We then removed the retractor. We placed vancomycin powder in the wound. We reapproximated patient's thoracolumbar fascia with interrupted #1 Vicryl suture. We reapproximated patient's subcutaneous tissue with interrupted 2-0 Vicryl suture. The reapproximated patient's skin with Steri-Strips and benzoin. The wound was then coated with bacitracin ointment. A sterile dressing was applied. The drapes were removed. The patient was subsequently returned to the supine position where they were extubated by the anesthesia team. He was then transported to  the post anesthesia care unit in stable condition. All sponge instrument and needle counts were reportedly correct at the end of this case.

## 2016-11-07 NOTE — Transfer of Care (Addendum)
Immediate Anesthesia Transfer of Care Note  Patient: Jody Taylor  Procedure(s) Performed: Procedure(s) with comments: POSTERIOR LUMBAR INTERBODY FUSION, INTERBODY PROSTHESIS, POSTERIOR LATERAL ARTHRODESIS, POSTERIOR NON-SEGMENTAL INSTRUMENTATION LUMBAR FIVE - SACRAL ONE (N/A) - POSTERIOR LUMBAR INTERBODY FUSION, INTERBODY PROSTHESIS, POSTERIOR LATERAL ARTHRODESIS, POSTERIOR NON-SEGMENTAL INSTRUMENTATION LUMBAR 5- SACRAL 1  Patient Location: PACU  Anesthesia Type:General  Level of Consciousness: awake, alert , oriented and patient cooperative  Airway & Oxygen Therapy: Patient Spontanous Breathing and Patient connected to nasal cannula oxygen  Post-op Assessment: Report given to RN and Post -op Vital signs reviewed and stable  Post vital signs: Reviewed and stable  Last Vitals:  Vitals:   11/07/16 1206 11/07/16 1855  BP: (!) 169/73   Pulse:    Resp:    Temp:  (P) 36.2 C    Last Pain:  Vitals:   11/07/16 1256  TempSrc:   PainSc: 7       Patients Stated Pain Goal: 5 (73/41/93 7902)  Complications: No apparent anesthesia complications

## 2016-11-07 NOTE — Anesthesia Preprocedure Evaluation (Addendum)
Anesthesia Evaluation  Patient identified by MRN, date of birth, ID band Patient awake    Reviewed: Allergy & Precautions, H&P , NPO status , Patient's Chart, lab work & pertinent test results, reviewed documented beta blocker date and time   History of Anesthesia Complications (+) PONV  Airway Mallampati: II  TM Distance: >3 FB Neck ROM: Full    Dental no notable dental hx. (+) Teeth Intact, Dental Advisory Given   Pulmonary sleep apnea and Continuous Positive Airway Pressure Ventilation ,    Pulmonary exam normal breath sounds clear to auscultation       Cardiovascular hypertension, Pt. on medications and Pt. on home beta blockers + CAD   Rhythm:Regular Rate:Normal     Neuro/Psych Depression TIA   GI/Hepatic Neg liver ROS, GERD  Medicated and Controlled,  Endo/Other  Hypothyroidism Morbid obesity  Renal/GU Renal InsufficiencyRenal disease  negative genitourinary   Musculoskeletal  (+) Arthritis , Osteoarthritis,    Abdominal   Peds  Hematology negative hematology ROS (+) anemia ,   Anesthesia Other Findings   Reproductive/Obstetrics negative OB ROS                            Anesthesia Physical Anesthesia Plan  ASA: III  Anesthesia Plan: General   Post-op Pain Management:    Induction: Intravenous  Airway Management Planned: Oral ETT  Additional Equipment:   Intra-op Plan:   Post-operative Plan: Extubation in OR  Informed Consent: I have reviewed the patients History and Physical, chart, labs and discussed the procedure including the risks, benefits and alternatives for the proposed anesthesia with the patient or authorized representative who has indicated his/her understanding and acceptance.   Dental advisory given  Plan Discussed with: CRNA  Anesthesia Plan Comments:         Anesthesia Quick Evaluation

## 2016-11-07 NOTE — Progress Notes (Signed)
Patient ID: Jody Taylor, female   DOB: 25-Sep-1940, 76 y.o.   MRN: [id number] Subjective:  The patient is alert and pleasant. She is in no apparent distress.  Objective: Vital signs in last 24 hours: Temp:  [97.2 F (36.2 C)-98.1 F (36.7 C)] 97.2 F (36.2 C) (05/24 1855) Pulse Rate:  [71] 71 (05/24 1204) Resp:  [18] 18 (05/24 1204) BP: (169)/(73) 169/73 (05/24 1206) SpO2:  [96 %] 96 % (05/24 1204) Weight:  [98 kg (216 lb)] 98 kg (216 lb) (05/24 1204)  Intake/Output from previous day: No intake/output data recorded. Intake/Output this shift: No intake/output data recorded.  Physical exam the patient is alert and pleasant. She is moving her lower extremities well.  Lab Results: No results for input(s): WBC, HGB, HCT, PLT in the last 72 hours. BMET No results for input(s): NA, K, CL, CO2, GLUCOSE, BUN, CREATININE, CALCIUM in the last 72 hours.  Studies/Results: Dg Lumbar Spine 1 View  Result Date: 11/07/2016 CLINICAL DATA:  Localization for posterior lumbar interbody fusion EXAM: LUMBAR SPINE - 1 VIEW COMPARISON:  Lumbar spine radiograph 09/24/2016 FINDINGS: The instrument tip is at the L5-S1 level. IMPRESSION: Intraoperative localization of L5-S1. Electronically Signed   By: Ulyses Jarred M.D.   On: 11/07/2016 16:11    Assessment/Plan: The patient is doing well. I spoke with her family.  LOS: 0 days     Jody Taylor D 11/07/2016, 7:30 PM

## 2016-11-08 LAB — BASIC METABOLIC PANEL
Anion gap: 6 (ref 5–15)
BUN: 19 mg/dL (ref 6–20)
CALCIUM: 8.6 mg/dL — AB (ref 8.9–10.3)
CHLORIDE: 105 mmol/L (ref 101–111)
CO2: 26 mmol/L (ref 22–32)
Creatinine, Ser: 1.64 mg/dL — ABNORMAL HIGH (ref 0.44–1.00)
GFR calc Af Amer: 34 mL/min — ABNORMAL LOW (ref >60)
GFR calc non Af Amer: 30 mL/min — ABNORMAL LOW (ref >60)
GLUCOSE: 152 mg/dL — AB (ref 65–99)
Potassium: 4.6 mmol/L (ref 3.5–5.1)
Sodium: 137 mmol/L (ref 135–145)

## 2016-11-08 LAB — CBC
HEMATOCRIT: 35.5 % — AB (ref 36.0–46.0)
Hemoglobin: 10.9 g/dL — ABNORMAL LOW (ref 12.0–15.0)
MCH: 28.8 pg (ref 26.0–34.0)
MCHC: 30.7 g/dL (ref 30.0–36.0)
MCV: 93.9 fL (ref 78.0–100.0)
Platelets: 239 10*3/uL (ref 150–400)
RBC: 3.78 MIL/uL — ABNORMAL LOW (ref 3.87–5.11)
RDW: 14 % (ref 11.5–15.5)
WBC: 14.6 10*3/uL — AB (ref 4.0–10.5)

## 2016-11-08 LAB — GLUCOSE, CAPILLARY
Glucose-Capillary: 102 mg/dL — ABNORMAL HIGH (ref 65–99)
Glucose-Capillary: 122 mg/dL — ABNORMAL HIGH (ref 65–99)
Glucose-Capillary: 122 mg/dL — ABNORMAL HIGH (ref 65–99)
Glucose-Capillary: 126 mg/dL — ABNORMAL HIGH (ref 65–99)
Glucose-Capillary: 126 mg/dL — ABNORMAL HIGH (ref 65–99)

## 2016-11-08 MED ORDER — INSULIN ASPART 100 UNIT/ML ~~LOC~~ SOLN
0.0000 [IU] | SUBCUTANEOUS | Status: DC
  Administered 2016-11-08 – 2016-11-09 (×5): 3 [IU] via SUBCUTANEOUS

## 2016-11-08 MED FILL — Heparin Sodium (Porcine) Inj 1000 Unit/ML: INTRAMUSCULAR | Qty: 30 | Status: AC

## 2016-11-08 NOTE — Consult Note (Signed)
[facility] Primary Care Navigator  11/08/2016  Jody Taylor [birth year] [id number]   Met with patient, husband Jody Taylor) and son Jody Taylor, Jody Taylor.) at the bedside to identify possible discharge needs. Patient reports having worsening back pain radiating to left leg that had led to this admission/ surgery. Patient endorses Dr. Nelda Bucks with [facility]) as herprimary care provider.   Patientshared using [facility] in [place] to obtain medications without any problem.   Patient states managing her own medications at home using "pill box" system weekly.  Her husband providestransportation to her doctors' appointments.  Patient's husband is the primary caregiverat homeas stated.She mentioned that her son and family lives across their house and will be able to assist when needed as well.  Anticipated discharge plan is home per patient/ husband. Awaiting for therapist recommendation for discharge.  Patient and husband voiced understanding to call primary care provider's office when she returns home, for a post discharge follow-up appointment within a week or sooner if needs arise.Patient letter (with PCP's contact number) was provided as theirreminder.  Patient denies any further health management needs or concerns at this time. She states that she is managing HF at home with medications, weight monitoring / recording daily, diet and follow-up with provider. [facility] care management contact information provided for future needs that she may have.   For questions, please contact:  Dannielle Huh, BSN, RN- [facility] Primary Care Navigator  Telephone: [phone] [place]

## 2016-11-08 NOTE — Progress Notes (Signed)
Orthopedic Tech Progress Note Patient Details:  Jody Taylor [birth year] [id number]  Patient ID: [name], female   DOB: 29-May-1941, 76 y.o.   MRN: [id number]   [name] 11/08/2016, 10:30 AMCalled [employer] for Lumbar brace.

## 2016-11-08 NOTE — Progress Notes (Signed)
Patient ID: Jody Taylor, female   DOB: [birth year], 76 y.o.   MRN: [id number] Subjective:  The patient is alert and pleasant. She looks well. Her back is appropriately sore.  Objective: Vital signs in last 24 hours: Temp:  [97.2 F (36.2 C)-99.1 F (37.3 C)] 97.9 F (36.6 C) (05/25 0458) Pulse Rate:  [71-124] 75 (05/25 0458) Resp:  [11-24] 20 (05/25 0458) BP: (77-169)/(55-95) 110/69 (05/25 0458) SpO2:  [89 %-100 %] 98 % (05/25 0458) Weight:  [97.9 kg (215 lb 13.3 oz)-98 kg (216 lb)] 97.9 kg (215 lb 13.3 oz) (05/24 2010)  Intake/Output from previous day: 05/24 0701 - 05/25 0700 In: 2390 [I.V.:2290; IV Piggyback:100] Out: 7124 [Urine:1160; Blood:125] Intake/Output this shift: No intake/output data recorded.  Physical exam the patient is alert and oriented. Her strength is grossly normal in lower extremities.  Lab Results:  Recent Labs  11/08/16 0422  WBC 14.6*  HGB 10.9*  HCT 35.5*  PLT 239   BMET  Recent Labs  11/08/16 0422  NA 137  K 4.6  CL 105  CO2 26  GLUCOSE 152*  BUN 19  CREATININE 1.64*  CALCIUM 8.6*    Studies/Results: Dg Lumbar Spine 2-3 Views  Result Date: 11/07/2016 CLINICAL DATA:  L5-S1 fusion. EXAM: LUMBAR SPINE - 2-3 VIEW; DG C-ARM 61-120 MIN COMPARISON:  Previous examinations, including the upper a radiograph obtained earlier today. FINDINGS: Four C-arm views of the lumbosacral region demonstrate placement of pedicle screws and an interbody metallic spacer at the P8-K9 level. Level determination is based on the labeling of the levels on the radiograph earlier today. These also correspond to the labeling of the levels on the MR dated 05/22/2016. IMPRESSION: Operative radiographs of lumbar fusion at the L5-S1 level. Electronically Signed   By: Claudie Revering M.D.   On: 11/07/2016 20:32   Dg Lumbar Spine 1 View  Result Date: 11/07/2016 CLINICAL DATA:  Localization for posterior lumbar interbody fusion EXAM: LUMBAR SPINE - 1 VIEW COMPARISON:  Lumbar  spine radiograph 09/24/2016 FINDINGS: The instrument tip is at the L5-S1 level. IMPRESSION: Intraoperative localization of L5-S1. Electronically Signed   By: Ulyses Jarred M.D.   On: 11/07/2016 16:11   Dg C-arm 1-60 Min  Result Date: 11/07/2016 CLINICAL DATA:  L5-S1 fusion. EXAM: LUMBAR SPINE - 2-3 VIEW; DG C-ARM 61-120 MIN COMPARISON:  Previous examinations, including the upper a radiograph obtained earlier today. FINDINGS: Four C-arm views of the lumbosacral region demonstrate placement of pedicle screws and an interbody metallic spacer at the X8-P3 level. Level determination is based on the labeling of the levels on the radiograph earlier today. These also correspond to the labeling of the levels on the MR dated 05/22/2016. IMPRESSION: Operative radiographs of lumbar fusion at the L5-S1 level. Electronically Signed   By: Claudie Revering M.D.   On: 11/07/2016 20:32    Assessment/Plan: Postop day #1: The patient is doing well neurologically. She will likely go home tomorrow. I gave her discharge instructions and answered all her questions.  Hyperglycemia: I will add sliding scale insulin. She will follow-up with her primary doctor regarding this.  LOS: 1 day     Haiven Nardone D 11/08/2016, 7:09 AM

## 2016-11-08 NOTE — Evaluation (Signed)
Physical Therapy Evaluation Patient Details Name: Jody Taylor MRN: [id number] DOB: [birth year] Today's Date: 11/08/2016   History of Present Illness  Pt is a 76 y/o female s/p L5-S1 laminectomy and TLIF. PMH including but not limited to  Clinical Impression  Pt presented supine in bed with HOB elevated, awake and willing to participate in therapy session. Prior to admission, pt reported that she occasionally ambulated with use of SPC and was independent with ADLs. Pt lives with her husband in a single level house with a ramped entrance and will have 24/7 supervision initially upon d/c. Pt ambulated in hallway with use of RW and min guard for safety. PT will continue to follow acutely to ensure a safe d/c home.    Follow Up Recommendations No PT follow up;Supervision/Assistance - 24 hour    Equipment Recommendations  None recommended by PT    Recommendations for Other Services       Precautions / Restrictions Precautions Precautions: Fall;Back Precaution Comments: PT reviewed all 3/3 back precautions with pt and pt's family Required Braces or Orthoses: Spinal Brace Spinal Brace: Lumbar corset;Applied in sitting position Restrictions Weight Bearing Restrictions: No      Mobility  Bed Mobility Overal bed mobility: Needs Assistance Bed Mobility: Rolling;Sidelying to Sit;Sit to Sidelying Rolling: Min assist Sidelying to sit: Min assist     Sit to sidelying: Min assist General bed mobility comments: increased time, good technique, use of bed rails, min A to elevate trunk to achieve sitting EOB and min A at bilateral LEs to return to sidelying  Transfers Overall transfer level: Needs assistance Equipment used: Rolling walker (2 wheeled) Transfers: Sit to/from Stand Sit to Stand: Min guard         General transfer comment: increased time, vci'ng for bilateral hand placement, min guard for safety  Ambulation/Gait Ambulation/Gait assistance: Min guard Ambulation Distance  (Feet): 100 Feet Assistive device: Rolling walker (2 wheeled) Gait Pattern/deviations: Step-through pattern;Decreased stride length Gait velocity: decreased Gait velocity interpretation: Below normal speed for age/gender General Gait Details: mild instability with RW but no LOB or need for physical assistance, distance limited secondary to fatigue  Stairs            Wheelchair Mobility    Modified Rankin (Stroke Patients Only)       Balance Overall balance assessment: Needs assistance Sitting-balance support: Feet supported Sitting balance-Leahy Scale: Fair Sitting balance - Comments: max A to don lumbar corset sitting EOB   Standing balance support: During functional activity;Single extremity supported Standing balance-Leahy Scale: Poor Standing balance comment: pt dependent on at least one UE support on RW                             Pertinent Vitals/Pain Pain Assessment: 0-10 Pain Score: 4  Pain Location: back Pain Descriptors / Indicators: Sore Pain Intervention(s): Monitored during session;Repositioned    Home Living Family/patient expects to be discharged to:: Private residence Living Arrangements: Spouse/significant other Available Help at Discharge: Family;Available 24 hours/day Type of Home: House Home Access: Ramped entrance     Home Layout: One level Home Equipment: Shower seat;Crutches;Cane - single point;Walker - 4 wheels      Prior Function Level of Independence: Independent with assistive device(s)         Comments: occasionally uses a SPC to ambulate     Hand Dominance   Dominant Hand: Right    Extremity/Trunk Assessment   Upper Extremity Assessment Upper Extremity  Assessment: Defer to OT evaluation    Lower Extremity Assessment Lower Extremity Assessment: Overall WFL for tasks assessed    Cervical / Trunk Assessment Cervical / Trunk Assessment: Other exceptions Cervical / Trunk Exceptions: s/p low back sx   Communication   Communication: No difficulties  Cognition Arousal/Alertness: Awake/alert Behavior During Therapy: WFL for tasks assessed/performed Overall Cognitive Status: Within Functional Limits for tasks assessed                                        General Comments      Exercises     Assessment/Plan    PT Assessment Patient needs continued PT services  PT Problem List Decreased activity tolerance;Decreased balance;Decreased mobility;Decreased coordination;Decreased knowledge of use of DME;Decreased safety awareness;Decreased knowledge of precautions;Pain       PT Treatment Interventions DME instruction;Gait training;Stair training;Functional mobility training;Therapeutic activities;Therapeutic exercise;Balance training;Neuromuscular re-education;Patient/family education    PT Goals (Current goals can be found in the Care Plan section)  Acute Rehab PT Goals Patient Stated Goal: return home, decrease pain PT Goal Formulation: With patient Time For Goal Achievement: 11/22/16 Potential to Achieve Goals: Good    Frequency Min 5X/week   Barriers to discharge        Co-evaluation               AM-PAC PT "6 Clicks" Daily Activity  Outcome Measure Difficulty turning over in bed (including adjusting bedclothes, sheets and blankets)?: Total Difficulty moving from lying on back to sitting on the side of the bed? : Total Difficulty sitting down on and standing up from a chair with arms (e.g., wheelchair, bedside commode, etc,.)?: Total Help needed moving to and from a bed to chair (including a wheelchair)?: A Little Help needed walking in hospital room?: A Little Help needed climbing 3-5 steps with a railing? : A Lot 6 Click Score: 11    End of Session Equipment Utilized During Treatment: Gait belt;Back brace Activity Tolerance: Patient limited by fatigue Patient left: in bed;with call bell/phone within reach;with bed alarm set;with family/visitor  present Nurse Communication: Mobility status PT Visit Diagnosis: Other abnormalities of gait and mobility (R26.89);Pain Pain - part of body:  (back)    Time: 2355-7322 PT Time Calculation (min) (ACUTE ONLY): 23 min   Charges:   PT Evaluation $PT Eval Moderate Complexity: 1 Procedure PT Treatments $Gait Training: 8-22 mins   PT G Codes:        [place], PT, DPT [place] 11/08/2016, 2:05 PM

## 2016-11-08 NOTE — Progress Notes (Signed)
Patient ambulated 50 ft this morning with brace and walker. Tolerated ambulation well. Foley removed after ambulation. Patient due to void by 1200.

## 2016-11-08 NOTE — Progress Notes (Signed)
Pt will put her CPAP on

## 2016-11-09 LAB — GLUCOSE, CAPILLARY
GLUCOSE-CAPILLARY: 105 mg/dL — AB (ref 65–99)
GLUCOSE-CAPILLARY: 125 mg/dL — AB (ref 65–99)
Glucose-Capillary: 104 mg/dL — ABNORMAL HIGH (ref 65–99)
Glucose-Capillary: 106 mg/dL — ABNORMAL HIGH (ref 65–99)
Glucose-Capillary: 137 mg/dL — ABNORMAL HIGH (ref 65–99)
Glucose-Capillary: 140 mg/dL — ABNORMAL HIGH (ref 65–99)

## 2016-11-09 LAB — HEMOGLOBIN A1C
HEMOGLOBIN A1C: 5.6 % (ref 4.8–5.6)
MEAN PLASMA GLUCOSE: 114 mg/dL

## 2016-11-09 MED ORDER — OXYCODONE-ACETAMINOPHEN 5-325 MG PO TABS
1.0000 | ORAL_TABLET | ORAL | Status: DC | PRN
  Administered 2016-11-09 – 2016-11-10 (×4): 2 via ORAL
  Filled 2016-11-09 (×4): qty 2

## 2016-11-09 MED ORDER — INSULIN ASPART 100 UNIT/ML ~~LOC~~ SOLN
0.0000 [IU] | Freq: Three times a day (TID) | SUBCUTANEOUS | Status: DC
  Administered 2016-11-09 – 2016-11-10 (×2): 3 [IU] via SUBCUTANEOUS

## 2016-11-09 NOTE — Progress Notes (Signed)
Vitals:   11/08/16 2033 11/09/16 0112 11/09/16 0442 11/09/16 0857  BP: 115/74 120/66 129/61 117/83  Pulse: 83 88 80 (!) 50  Resp: 18 20 20 20   Temp: 98.6 F (37 C) 98.4 F (36.9 C) 98.3 F (36.8 C) 99.5 F (37.5 C)  TempSrc: Oral Oral Oral Oral  SpO2: 92% 96% 98% 94%  Weight:      Height:        CBC  Recent Labs  11/08/16 0422  WBC 14.6*  HGB 10.9*  HCT 35.5*  PLT 239   BMET  Recent Labs  11/08/16 0422  NA 137  K 4.6  CL 105  CO2 26  GLUCOSE 152*  BUN 19  CREATININE 1.64*  CALCIUM 8.6*    Patient sitting up in chair. Asked her to stand up so as to examine her incision. Significant difficulty in transfer from sitting to standing, requiring the assistance of her nurse and myself. Dressing removed, incision healing nicely, no erythema, swelling, or drainage. Have asked his nurse to apply a new dressing. Patient's nurse reports that she continues on parenteral analgesia (Dilaudid IV), and has no order for oral analgesia.  Plan: Case discussed with his physical therapist, who reports that she ambulated well yesterday. I feel that she needs further work with transfers.  Will order Percocet when necessary for pain.  Hosie Spangle, MD 11/09/2016, 10:21 AM

## 2016-11-09 NOTE — Progress Notes (Addendum)
Physical Therapy Treatment Patient Details Name: Jody Taylor MRN: [id number] DOB: 1941-05-17 Today's Date: 11/09/2016    History of Present Illness Pt is a 76 y/o female s/p L5-S1 laminectomy and TLIF. PMH including but not limited to HTN, CHF, CAD and OSA uses CPAP at night    PT Comments    Pt making steady progress with therapy. Pt was able to perform bed mobility with min guard for safety only, no physical assistance, with good log roll technique. PT provided min A for sit<> stand transfer for safety with RW. Pt also increasing distance ambulated this session. PT will continue to follow acutely to ensure a safe d/c home.   Follow Up Recommendations  No PT follow up;Supervision/Assistance - 24 hour     Equipment Recommendations  None recommended by PT    Recommendations for Other Services       Precautions / Restrictions Precautions Precautions: Fall;Back Precaution Comments: PT reviewed all 3/3 back precautions with pt and pt's family Required Braces or Orthoses: Spinal Brace Spinal Brace: Lumbar corset;Applied in sitting position Restrictions Weight Bearing Restrictions: No    Mobility  Bed Mobility Overal bed mobility: Needs Assistance Bed Mobility: Rolling;Sidelying to Sit;Sit to Sidelying Rolling: Min guard Sidelying to sit: Min guard     Sit to sidelying: Min guard General bed mobility comments: increased time and effort, good technique with occasional cueing, use of bed rails, close min guard for safety  Transfers Overall transfer level: Needs assistance Equipment used: Rolling walker (2 wheeled) Transfers: Sit to/from Stand Sit to Stand: Min assist         General transfer comment: increased time, vci'ng for bilateral hand placement, min A for stability and safety with transition into standing from bed  Ambulation/Gait Ambulation/Gait assistance: Min guard Ambulation Distance (Feet): 200 Feet Assistive device: Rolling walker (2 wheeled) Gait  Pattern/deviations: Step-through pattern;Decreased stride length Gait velocity: decreased Gait velocity interpretation: Below normal speed for age/gender General Gait Details: mild instability with RW but no LOB or need for physical assistance. pt required standing rest breaks x4 secondary to fatigue   Stairs            Wheelchair Mobility    Modified Rankin (Stroke Patients Only)       Balance Overall balance assessment: Needs assistance Sitting-balance support: Feet supported Sitting balance-Leahy Scale: Fair     Standing balance support: During functional activity;Single extremity supported Standing balance-Leahy Scale: Poor Standing balance comment: pt dependent on at least one UE support on RW                            Cognition Arousal/Alertness: Awake/alert Behavior During Therapy: [facility] for tasks assessed/performed Overall Cognitive Status: Within Functional Limits for tasks assessed                                        Exercises      General Comments        Pertinent Vitals/Pain Pain Assessment: Faces Faces Pain Scale: Hurts little more Pain Location: back Pain Descriptors / Indicators: Sore Pain Intervention(s): Monitored during session;Repositioned    Home Living                      Prior Function            PT Goals (current goals can now  be found in the care plan section) Acute Rehab PT Goals PT Goal Formulation: With patient Time For Goal Achievement: 11/22/16 Potential to Achieve Goals: Good Progress towards PT goals: Progressing toward goals    Frequency    Min 5X/week      PT Plan Current plan remains appropriate    Co-evaluation              AM-PAC PT "6 Clicks" Daily Activity  Outcome Measure  Difficulty turning over in bed (including adjusting bedclothes, sheets and blankets)?: A Little Difficulty moving from lying on back to sitting on the side of the bed? : A  Little Difficulty sitting down on and standing up from a chair with arms (e.g., wheelchair, bedside commode, etc,.)?: Total Help needed moving to and from a bed to chair (including a wheelchair)?: A Little Help needed walking in hospital room?: A Little Help needed climbing 3-5 steps with a railing? : A Lot 6 Click Score: 15    End of Session Equipment Utilized During Treatment: Gait belt;Back brace Activity Tolerance: Patient limited by fatigue Patient left: in bed;with call bell/phone within reach;with family/visitor present Nurse Communication: Mobility status PT Visit Diagnosis: Other abnormalities of gait and mobility (R26.89);Pain Pain - part of body:  (back)     Time: 5498-2641 PT Time Calculation (min) (ACUTE ONLY): 20 min  Charges:  $Gait Training: 8-22 mins                    G Codes:       [place], [place], [place] 11/09/2016, 10:56 AM

## 2016-11-09 NOTE — Progress Notes (Signed)
Patient placed on CPAP and doing well. &cmH2o and no O2 bleed in. Patient comfortable and will call if any assistance needed

## 2016-11-09 NOTE — Plan of Care (Signed)
Problem: Activity: Goal: Ability to tolerate increased activity will improve Outcome: Progressing Pt. Up to BR with walker with assist, and tolerated well from bed to BR.

## 2016-11-10 LAB — GLUCOSE, CAPILLARY: GLUCOSE-CAPILLARY: 122 mg/dL — AB (ref 65–99)

## 2016-11-10 MED ORDER — CYCLOBENZAPRINE HCL 10 MG PO TABS: 10.0000 mg | ORAL_TABLET | Freq: Three times a day (TID) | ORAL | 0 refills | Status: DC | PRN

## 2016-11-10 MED ORDER — OXYCODONE-ACETAMINOPHEN 5-325 MG PO TABS: 1.0000 | ORAL_TABLET | ORAL | 0 refills | Status: DC | PRN

## 2016-11-10 NOTE — Progress Notes (Signed)
Has Rx for percocet, flexaril called or faxed in to their pharm near home. Ready for d/c home w/daughter and husband.

## 2016-11-10 NOTE — Discharge Summary (Signed)
Physician Discharge Summary  Patient ID: Jody Taylor MRN: [id number] DOB/AGE: 1941-01-13 76 y.o.  Admit date: 11/07/2016 Discharge date: 11/10/2016  Admission Diagnoses:  Spondylolisthesis of lumbosacral reion  Discharge Diagnoses:  Same Active Problems:   Spondylolisthesis of lumbosacral region   Discharged Condition: Stable  Hospital Course:  Jody Taylor is a 76 y.o. female who was admitted for the below procedure. There were no post operative complications. At time of discharge, pain was well controlled, ambulating with Pt/OT, tolerating po, voiding normal. Ready for discharge. Pt/OT rec HH. Agree with this. I have placed necessary orders for this.  Treatments: Surgery -  Bilateral L5-S1 Laminotomy/foraminotomies to decompress the bilateral L5 and S1 nerve roots(the work required to do this was in addition to the work required to do the posterior lumbar interbody fusion because of the patient's spinal stenosis, facet arthropathy. Etc. requiring a wide decompression of the nerve roots.); L5-S1 transforaminal lumbar interbody fusion with local morselized autograft bone and Kinnex graft extender; insertion of interbody prosthesis at L5-S1 (globus peek expandable interbody prosthesis); posterior nonsegmental instrumentation from L5 to S1 with globus titanium pedicle screws and rods; posterior lateral arthrodesis at L5-S1 with local morselized autograft bone and Kinnex bone graft extender.  Discharge Exam: Blood pressure 120/66, pulse 90, temperature 97.8 F (36.6 C), temperature source Oral, resp. rate 20, height 5' 2.5" (1.588 m), weight 97.9 kg (215 lb 13.3 oz), SpO2 93 %. Awake, alert, oriented Speech fluent, appropriate CN grossly intact 5/5 BUE/BLE Wound c/d/i  Disposition:   Discharge Instructions    Call MD for:  difficulty breathing, headache or visual disturbances    Complete by:  As directed    Call MD for:  persistant dizziness or light-headedness    Complete by:   As directed    Call MD for:  redness, tenderness, or signs of infection (pain, swelling, redness, odor or green/yellow discharge around incision site)    Complete by:  As directed    Call MD for:  severe uncontrolled pain    Complete by:  As directed    Call MD for:  temperature >100.4    Complete by:  As directed    Diet general    Complete by:  As directed    Driving Restrictions    Complete by:  As directed    Do not drive until given clearance.   Increase activity slowly    Complete by:  As directed    Lifting restrictions    Complete by:  As directed    Do not lift anything >10lbs. Avoid bending and twisting in awkward positions. Avoid bending at the back.     Allergies as of 11/10/2016      Reactions   Ace Inhibitors Other (See Comments), Cough   CHEST PAIN   Other    Patient reports receiving blood after a miscarriage "years ago". She states she broke out from receiving this blood. Has not received any since   Codeine Nausea And Vomiting   Levaquin [levofloxacin] Nausea And Vomiting      Medication List    TAKE these medications   Albuterol Sulfate 108 (90 Base) MCG/ACT Aepb Inhale 2 puffs into the lungs every 6 (six) hours as needed (wheexing or shortness of breath).   aspirin EC 81 MG tablet Take 81 mg by mouth daily.   atorvastatin 80 MG tablet Commonly known as:  LIPITOR Take 80 mg by mouth daily.   buPROPion 150 MG 12 hr tablet Commonly known as:  WELLBUTRIN SR Take 150 mg by mouth daily. Verified SR   celecoxib 200 MG capsule Commonly known as:  CELEBREX Take 200 mg by mouth daily as needed for mild pain.   citalopram 40 MG tablet Commonly known as:  CELEXA Take 40 mg by mouth daily.   cyclobenzaprine 10 MG tablet Commonly known as:  FLEXERIL Take 1 tablet (10 mg total) by mouth 3 (three) times daily as needed for muscle spasms.   dextromethorphan-guaiFENesin 10-100 MG/5ML liquid Commonly known as:  ROBITUSSIN-DM Take 5 mLs by mouth at  bedtime.   famotidine 40 MG tablet Commonly known as:  PEPCID Take 40 mg by mouth daily.   fluticasone 50 MCG/ACT nasal spray Commonly known as:  FLONASE Place 2 sprays into both nostrils daily. What changed:  when to take this   furosemide 80 MG tablet Commonly known as:  LASIX Take 80 mg by mouth 2 (two) times daily.   ipratropium-albuterol 0.5-2.5 (3) MG/3ML Soln Commonly known as:  DUONEB Take 3 mLs by nebulization 2 (two) times daily as needed (wheezing or shortness of breath).   isosorbide mononitrate 120 MG 24 hr tablet Commonly known as:  IMDUR Take 120 mg by mouth daily.   levothyroxine 50 MCG tablet Commonly known as:  SYNTHROID, LEVOTHROID Take 50 mcg by mouth daily before breakfast.   metoprolol tartrate 50 MG tablet Commonly known as:  LOPRESSOR Take 50 mg by mouth daily. Verified tartrate   omeprazole 20 MG capsule Commonly known as:  PRILOSEC Take 20 mg by mouth daily.   oxyCODONE-acetaminophen 5-325 MG tablet Commonly known as:  PERCOCET/ROXICET Take 1-2 tablets by mouth every 4 (four) hours as needed for moderate pain.   potassium chloride SA 20 MEQ tablet Commonly known as:  K-DUR,KLOR-CON Take 40 mEq by mouth daily.   QUEtiapine 25 MG tablet Commonly known as:  SEROQUEL Take 75 mg by mouth at bedtime. Takes 3 at bedtime   TURMERIC PO Take 50 mg by mouth 3 (three) times a week.      Follow-up Information    Newman Pies, MD. Schedule an appointment as soon as possible for a visit in 2 day(s).   Specialty:  Neurosurgery Why:  For first post op visit Contact information: [address] [place] [address] [place] [address] [phone]           Signed: Traci Sermon 11/10/2016, 10:43 AM

## 2016-11-10 NOTE — Care Management Note (Signed)
Case Management Note  Patient Details  Name: Jody Taylor MRN: [id number] Date of Birth: [birth year]  Subjective/Objective:                  Spondylolisthesis of lumbosacral region Action/Plan: Discharge planning Expected Discharge Date:  11/10/16               Expected Discharge Plan:  [place]  In-House Referral:     Discharge planning Services  CM Consult  Post Acute Care Choice:  Durable Medical Equipment, Home Health Choice offered to:  Adult Children  DME Arranged:  3-N-1 DME Agency:  [place] Arranged:  PT, OT [place] Agency:  Other - See comment  Status of Service:  Completed, signed off  If discussed at [place] of Stay Meetings, dates discussed:    Additional Comments: CM spoke with pt's daughter for choice and explained limited choice bc of insurance/staffing.  Family chooses [place]. Referral called to [facility] rep, Ronalee Belts who states they will not be able to provide OT. CM relayed this information to family who states this is OK as the granddaughter, who will be wil pt for week in OT and can manage this therapy.  CM notified [facility] rep, Reggie to please deliver the 3n1 to room so pt can discharge. No other CM needs were communicated. Dellie Catholic, RN 11/10/2016, 11:06 AM

## 2016-11-10 NOTE — Progress Notes (Signed)
Patient with no c/o pain during shift.  Ambulated in hall with standby assist, RW and brace approximately 40 yards with no complaints.  Continue to monitor and encourage use of IS.

## 2016-11-10 NOTE — Progress Notes (Signed)
Physical Therapy Treatment Patient Details Name: Jody Taylor MRN: [id number] DOB: 1940-10-08 Today's Date: 11/10/2016    History of Present Illness Pt is a 76 y/o female s/p L5-S1 laminectomy and TLIF. PMH including but not limited to HTN, CHF, CAD and OSA uses CPAP at night    PT Comments    Pt hopeful for DC today. I have encouraged the patient to gradually increase activity daily to tolerance. I have answered all patient's and family's question regarding PT and mobility.  Pt with poor recall of information and precautions.        Follow Up Recommendations  Supervision/Assistance - 24 hour;Home health PT     Equipment Recommendations  None recommended by PT    Recommendations for Other Services       Precautions / Restrictions Precautions Precautions: Fall;Back Precaution Booklet Issued: Yes (comment) Precaution Comments: PT reviewed all 3/3 back precautions with pt and pt's family (Pt unable to recall any back precautions. Issued sheet) Required Braces or Orthoses: Spinal Brace Spinal Brace: Lumbar corset;Applied in sitting position Restrictions Weight Bearing Restrictions: No    Mobility  Bed Mobility Overal bed mobility: Needs Assistance Bed Mobility: Sit to Sidelying         Sit to sidelying: Supervision General bed mobility comments: increased time and effort  Transfers Overall transfer level: Needs assistance Equipment used: Rolling walker (2 wheeled) Transfers: Sit to/from Stand Sit to Stand: Min guard         General transfer comment: Did not require cues for hand placement. Able to get up from recliner and 3-N-1 without physical assist   Ambulation/Gait Ambulation/Gait assistance: Supervision Ambulation Distance (Feet): 150 Feet Assistive device: Rolling walker (2 wheeled) Gait Pattern/deviations: Step-through pattern;Decreased stride length Gait velocity: decreased   General Gait Details: no balance losses   Stairs             Wheelchair Mobility    Modified Rankin (Stroke Patients Only)       Balance                                            Cognition Arousal/Alertness: Awake/alert Behavior During Therapy: WFL for tasks assessed/performed Overall Cognitive Status: Within Functional Limits for tasks assessed                                        Exercises      General Comments General comments (skin integrity, edema, etc.): required assist to don brace in sitting, mainly with placement of brace      Pertinent Vitals/Pain Pain Assessment: 0-10 Pain Score: 4  Pain Location: back-mainly left low back Pain Descriptors / Indicators: Sore Pain Intervention(s): Limited activity within patient's tolerance;RN gave pain meds during session    Home Living                      Prior Function            PT Goals (current goals can now be found in the care plan section) Progress towards PT goals: Progressing toward goals    Frequency    Min 5X/week      PT Plan Current plan remains appropriate    Co-evaluation  AM-PAC PT "6 Clicks" Daily Activity  Outcome Measure  Difficulty turning over in bed (including adjusting bedclothes, sheets and blankets)?: A Little Difficulty moving from lying on back to sitting on the side of the bed? : A Little Difficulty sitting down on and standing up from a chair with arms (e.g., wheelchair, bedside commode, etc,.)?: A Little Help needed moving to and from a bed to chair (including a wheelchair)?: A Little Help needed walking in hospital room?: A Little Help needed climbing 3-5 steps with a railing? : A Lot 6 Click Score: 17    End of Session Equipment Utilized During Treatment: Gait belt;Back brace Activity Tolerance: Patient tolerated treatment well Patient left: in bed;with call bell/phone within reach;with family/visitor present;with bed alarm set Nurse Communication: Mobility  status PT Visit Diagnosis: Other abnormalities of gait and mobility (R26.89);Pain Pain - part of body:  (back)     Time: 6812-7517 PT Time Calculation (min) (ACUTE ONLY): 38 min  Charges:  $Gait Training: 38-52 mins                    G CodesLavonia Taylor, [place]  [phone] 11/10/2016    Jody Taylor 11/10/2016, 10:15 AM

## 2016-11-13 DIAGNOSIS — I130 Hypertensive heart and chronic kidney disease with heart failure and stage 1 through stage 4 chronic kidney disease, or unspecified chronic kidney disease: Secondary | ICD-10-CM | POA: Diagnosis not present

## 2016-11-13 DIAGNOSIS — Z7982 Long term (current) use of aspirin: Secondary | ICD-10-CM | POA: Diagnosis not present

## 2016-11-13 DIAGNOSIS — Z4789 Encounter for other orthopedic aftercare: Secondary | ICD-10-CM | POA: Diagnosis not present

## 2016-11-13 DIAGNOSIS — M1991 Primary osteoarthritis, unspecified site: Secondary | ICD-10-CM | POA: Diagnosis not present

## 2016-11-13 DIAGNOSIS — I2510 Atherosclerotic heart disease of native coronary artery without angina pectoris: Secondary | ICD-10-CM | POA: Diagnosis not present

## 2016-11-13 DIAGNOSIS — Z7951 Long term (current) use of inhaled steroids: Secondary | ICD-10-CM | POA: Diagnosis not present

## 2016-11-13 DIAGNOSIS — I5032 Chronic diastolic (congestive) heart failure: Secondary | ICD-10-CM | POA: Diagnosis not present

## 2016-11-13 DIAGNOSIS — M5136 Other intervertebral disc degeneration, lumbar region: Secondary | ICD-10-CM | POA: Diagnosis not present

## 2016-11-13 DIAGNOSIS — Z95828 Presence of other vascular implants and grafts: Secondary | ICD-10-CM | POA: Diagnosis not present

## 2016-11-13 DIAGNOSIS — N183 Chronic kidney disease, stage 3 (moderate): Secondary | ICD-10-CM | POA: Diagnosis not present

## 2016-11-13 DIAGNOSIS — D631 Anemia in chronic kidney disease: Secondary | ICD-10-CM | POA: Diagnosis not present

## 2016-11-13 DIAGNOSIS — Z981 Arthrodesis status: Secondary | ICD-10-CM | POA: Diagnosis not present

## 2016-11-15 DIAGNOSIS — Z95828 Presence of other vascular implants and grafts: Secondary | ICD-10-CM | POA: Diagnosis not present

## 2016-11-15 DIAGNOSIS — M1991 Primary osteoarthritis, unspecified site: Secondary | ICD-10-CM | POA: Diagnosis not present

## 2016-11-15 DIAGNOSIS — Z981 Arthrodesis status: Secondary | ICD-10-CM | POA: Diagnosis not present

## 2016-11-15 DIAGNOSIS — I5032 Chronic diastolic (congestive) heart failure: Secondary | ICD-10-CM | POA: Diagnosis not present

## 2016-11-15 DIAGNOSIS — M5136 Other intervertebral disc degeneration, lumbar region: Secondary | ICD-10-CM | POA: Diagnosis not present

## 2016-11-15 DIAGNOSIS — D631 Anemia in chronic kidney disease: Secondary | ICD-10-CM | POA: Diagnosis not present

## 2016-11-15 DIAGNOSIS — Z4789 Encounter for other orthopedic aftercare: Secondary | ICD-10-CM | POA: Diagnosis not present

## 2016-11-15 DIAGNOSIS — Z7982 Long term (current) use of aspirin: Secondary | ICD-10-CM | POA: Diagnosis not present

## 2016-11-15 DIAGNOSIS — N183 Chronic kidney disease, stage 3 (moderate): Secondary | ICD-10-CM | POA: Diagnosis not present

## 2016-11-15 DIAGNOSIS — I130 Hypertensive heart and chronic kidney disease with heart failure and stage 1 through stage 4 chronic kidney disease, or unspecified chronic kidney disease: Secondary | ICD-10-CM | POA: Diagnosis not present

## 2016-11-15 DIAGNOSIS — Z7951 Long term (current) use of inhaled steroids: Secondary | ICD-10-CM | POA: Diagnosis not present

## 2016-11-15 DIAGNOSIS — I2510 Atherosclerotic heart disease of native coronary artery without angina pectoris: Secondary | ICD-10-CM | POA: Diagnosis not present

## 2016-11-18 NOTE — Addendum Note (Signed)
Addendum  created 11/18/16 1602 by Oleta Mouse, MD   Sign clinical note

## 2016-11-19 DIAGNOSIS — Z7982 Long term (current) use of aspirin: Secondary | ICD-10-CM | POA: Diagnosis not present

## 2016-11-19 DIAGNOSIS — Z4789 Encounter for other orthopedic aftercare: Secondary | ICD-10-CM | POA: Diagnosis not present

## 2016-11-19 DIAGNOSIS — M1991 Primary osteoarthritis, unspecified site: Secondary | ICD-10-CM | POA: Diagnosis not present

## 2016-11-19 DIAGNOSIS — Z981 Arthrodesis status: Secondary | ICD-10-CM | POA: Diagnosis not present

## 2016-11-19 DIAGNOSIS — M5136 Other intervertebral disc degeneration, lumbar region: Secondary | ICD-10-CM | POA: Diagnosis not present

## 2016-11-19 DIAGNOSIS — Z95828 Presence of other vascular implants and grafts: Secondary | ICD-10-CM | POA: Diagnosis not present

## 2016-11-19 DIAGNOSIS — I5032 Chronic diastolic (congestive) heart failure: Secondary | ICD-10-CM | POA: Diagnosis not present

## 2016-11-19 DIAGNOSIS — I2510 Atherosclerotic heart disease of native coronary artery without angina pectoris: Secondary | ICD-10-CM | POA: Diagnosis not present

## 2016-11-19 DIAGNOSIS — I130 Hypertensive heart and chronic kidney disease with heart failure and stage 1 through stage 4 chronic kidney disease, or unspecified chronic kidney disease: Secondary | ICD-10-CM | POA: Diagnosis not present

## 2016-11-19 DIAGNOSIS — N183 Chronic kidney disease, stage 3 (moderate): Secondary | ICD-10-CM | POA: Diagnosis not present

## 2016-11-19 DIAGNOSIS — D631 Anemia in chronic kidney disease: Secondary | ICD-10-CM | POA: Diagnosis not present

## 2016-11-19 DIAGNOSIS — Z7951 Long term (current) use of inhaled steroids: Secondary | ICD-10-CM | POA: Diagnosis not present

## 2016-11-21 DIAGNOSIS — N183 Chronic kidney disease, stage 3 (moderate): Secondary | ICD-10-CM | POA: Diagnosis not present

## 2016-11-21 DIAGNOSIS — Z7951 Long term (current) use of inhaled steroids: Secondary | ICD-10-CM | POA: Diagnosis not present

## 2016-11-21 DIAGNOSIS — Z95828 Presence of other vascular implants and grafts: Secondary | ICD-10-CM | POA: Diagnosis not present

## 2016-11-21 DIAGNOSIS — Z4789 Encounter for other orthopedic aftercare: Secondary | ICD-10-CM | POA: Diagnosis not present

## 2016-11-21 DIAGNOSIS — Z981 Arthrodesis status: Secondary | ICD-10-CM | POA: Diagnosis not present

## 2016-11-21 DIAGNOSIS — I130 Hypertensive heart and chronic kidney disease with heart failure and stage 1 through stage 4 chronic kidney disease, or unspecified chronic kidney disease: Secondary | ICD-10-CM | POA: Diagnosis not present

## 2016-11-21 DIAGNOSIS — M5136 Other intervertebral disc degeneration, lumbar region: Secondary | ICD-10-CM | POA: Diagnosis not present

## 2016-11-21 DIAGNOSIS — I5032 Chronic diastolic (congestive) heart failure: Secondary | ICD-10-CM | POA: Diagnosis not present

## 2016-11-21 DIAGNOSIS — D631 Anemia in chronic kidney disease: Secondary | ICD-10-CM | POA: Diagnosis not present

## 2016-11-21 DIAGNOSIS — M1991 Primary osteoarthritis, unspecified site: Secondary | ICD-10-CM | POA: Diagnosis not present

## 2016-11-21 DIAGNOSIS — I2510 Atherosclerotic heart disease of native coronary artery without angina pectoris: Secondary | ICD-10-CM | POA: Diagnosis not present

## 2016-11-21 DIAGNOSIS — Z7982 Long term (current) use of aspirin: Secondary | ICD-10-CM | POA: Diagnosis not present

## 2016-11-25 DIAGNOSIS — I2510 Atherosclerotic heart disease of native coronary artery without angina pectoris: Secondary | ICD-10-CM | POA: Diagnosis not present

## 2016-11-25 DIAGNOSIS — I130 Hypertensive heart and chronic kidney disease with heart failure and stage 1 through stage 4 chronic kidney disease, or unspecified chronic kidney disease: Secondary | ICD-10-CM | POA: Diagnosis not present

## 2016-11-25 DIAGNOSIS — Z7982 Long term (current) use of aspirin: Secondary | ICD-10-CM | POA: Diagnosis not present

## 2016-11-25 DIAGNOSIS — M1991 Primary osteoarthritis, unspecified site: Secondary | ICD-10-CM | POA: Diagnosis not present

## 2016-11-25 DIAGNOSIS — Z4789 Encounter for other orthopedic aftercare: Secondary | ICD-10-CM | POA: Diagnosis not present

## 2016-11-25 DIAGNOSIS — D631 Anemia in chronic kidney disease: Secondary | ICD-10-CM | POA: Diagnosis not present

## 2016-11-25 DIAGNOSIS — M5136 Other intervertebral disc degeneration, lumbar region: Secondary | ICD-10-CM | POA: Diagnosis not present

## 2016-11-25 DIAGNOSIS — I5032 Chronic diastolic (congestive) heart failure: Secondary | ICD-10-CM | POA: Diagnosis not present

## 2016-11-25 DIAGNOSIS — Z7951 Long term (current) use of inhaled steroids: Secondary | ICD-10-CM | POA: Diagnosis not present

## 2016-11-25 DIAGNOSIS — Z95828 Presence of other vascular implants and grafts: Secondary | ICD-10-CM | POA: Diagnosis not present

## 2016-11-25 DIAGNOSIS — Z981 Arthrodesis status: Secondary | ICD-10-CM | POA: Diagnosis not present

## 2016-11-25 DIAGNOSIS — N183 Chronic kidney disease, stage 3 (moderate): Secondary | ICD-10-CM | POA: Diagnosis not present

## 2016-11-26 DIAGNOSIS — G4733 Obstructive sleep apnea (adult) (pediatric): Secondary | ICD-10-CM | POA: Diagnosis not present

## 2016-11-26 DIAGNOSIS — M1991 Primary osteoarthritis, unspecified site: Secondary | ICD-10-CM | POA: Diagnosis not present

## 2016-11-26 DIAGNOSIS — M5136 Other intervertebral disc degeneration, lumbar region: Secondary | ICD-10-CM | POA: Diagnosis not present

## 2016-11-26 DIAGNOSIS — N183 Chronic kidney disease, stage 3 (moderate): Secondary | ICD-10-CM | POA: Diagnosis not present

## 2016-11-26 DIAGNOSIS — Z7951 Long term (current) use of inhaled steroids: Secondary | ICD-10-CM | POA: Diagnosis not present

## 2016-11-26 DIAGNOSIS — Z95828 Presence of other vascular implants and grafts: Secondary | ICD-10-CM | POA: Diagnosis not present

## 2016-11-26 DIAGNOSIS — I5032 Chronic diastolic (congestive) heart failure: Secondary | ICD-10-CM | POA: Diagnosis not present

## 2016-11-26 DIAGNOSIS — I130 Hypertensive heart and chronic kidney disease with heart failure and stage 1 through stage 4 chronic kidney disease, or unspecified chronic kidney disease: Secondary | ICD-10-CM | POA: Diagnosis not present

## 2016-11-26 DIAGNOSIS — Z4789 Encounter for other orthopedic aftercare: Secondary | ICD-10-CM | POA: Diagnosis not present

## 2016-11-26 DIAGNOSIS — D631 Anemia in chronic kidney disease: Secondary | ICD-10-CM | POA: Diagnosis not present

## 2016-11-26 DIAGNOSIS — Z981 Arthrodesis status: Secondary | ICD-10-CM | POA: Diagnosis not present

## 2016-11-26 DIAGNOSIS — I2510 Atherosclerotic heart disease of native coronary artery without angina pectoris: Secondary | ICD-10-CM | POA: Diagnosis not present

## 2016-11-26 DIAGNOSIS — Z7982 Long term (current) use of aspirin: Secondary | ICD-10-CM | POA: Diagnosis not present

## 2016-11-27 DIAGNOSIS — N183 Chronic kidney disease, stage 3 (moderate): Secondary | ICD-10-CM | POA: Diagnosis not present

## 2016-11-27 DIAGNOSIS — Z981 Arthrodesis status: Secondary | ICD-10-CM | POA: Diagnosis not present

## 2016-11-27 DIAGNOSIS — I5032 Chronic diastolic (congestive) heart failure: Secondary | ICD-10-CM | POA: Diagnosis not present

## 2016-11-27 DIAGNOSIS — Z7951 Long term (current) use of inhaled steroids: Secondary | ICD-10-CM | POA: Diagnosis not present

## 2016-11-27 DIAGNOSIS — M1991 Primary osteoarthritis, unspecified site: Secondary | ICD-10-CM | POA: Diagnosis not present

## 2016-11-27 DIAGNOSIS — I2510 Atherosclerotic heart disease of native coronary artery without angina pectoris: Secondary | ICD-10-CM | POA: Diagnosis not present

## 2016-11-27 DIAGNOSIS — D631 Anemia in chronic kidney disease: Secondary | ICD-10-CM | POA: Diagnosis not present

## 2016-11-27 DIAGNOSIS — Z7982 Long term (current) use of aspirin: Secondary | ICD-10-CM | POA: Diagnosis not present

## 2016-11-27 DIAGNOSIS — Z4789 Encounter for other orthopedic aftercare: Secondary | ICD-10-CM | POA: Diagnosis not present

## 2016-11-27 DIAGNOSIS — Z95828 Presence of other vascular implants and grafts: Secondary | ICD-10-CM | POA: Diagnosis not present

## 2016-11-27 DIAGNOSIS — M5136 Other intervertebral disc degeneration, lumbar region: Secondary | ICD-10-CM | POA: Diagnosis not present

## 2016-11-27 DIAGNOSIS — I130 Hypertensive heart and chronic kidney disease with heart failure and stage 1 through stage 4 chronic kidney disease, or unspecified chronic kidney disease: Secondary | ICD-10-CM | POA: Diagnosis not present

## 2016-12-06 DIAGNOSIS — I130 Hypertensive heart and chronic kidney disease with heart failure and stage 1 through stage 4 chronic kidney disease, or unspecified chronic kidney disease: Secondary | ICD-10-CM | POA: Diagnosis not present

## 2016-12-06 DIAGNOSIS — Z4789 Encounter for other orthopedic aftercare: Secondary | ICD-10-CM | POA: Diagnosis not present

## 2016-12-06 DIAGNOSIS — Z95828 Presence of other vascular implants and grafts: Secondary | ICD-10-CM | POA: Diagnosis not present

## 2016-12-06 DIAGNOSIS — M5136 Other intervertebral disc degeneration, lumbar region: Secondary | ICD-10-CM | POA: Diagnosis not present

## 2016-12-06 DIAGNOSIS — M1991 Primary osteoarthritis, unspecified site: Secondary | ICD-10-CM | POA: Diagnosis not present

## 2016-12-06 DIAGNOSIS — D631 Anemia in chronic kidney disease: Secondary | ICD-10-CM | POA: Diagnosis not present

## 2016-12-06 DIAGNOSIS — Z7982 Long term (current) use of aspirin: Secondary | ICD-10-CM | POA: Diagnosis not present

## 2016-12-06 DIAGNOSIS — I5032 Chronic diastolic (congestive) heart failure: Secondary | ICD-10-CM | POA: Diagnosis not present

## 2016-12-06 DIAGNOSIS — Z7951 Long term (current) use of inhaled steroids: Secondary | ICD-10-CM | POA: Diagnosis not present

## 2016-12-06 DIAGNOSIS — N183 Chronic kidney disease, stage 3 (moderate): Secondary | ICD-10-CM | POA: Diagnosis not present

## 2016-12-06 DIAGNOSIS — Z981 Arthrodesis status: Secondary | ICD-10-CM | POA: Diagnosis not present

## 2016-12-06 DIAGNOSIS — I2510 Atherosclerotic heart disease of native coronary artery without angina pectoris: Secondary | ICD-10-CM | POA: Diagnosis not present

## 2016-12-13 DIAGNOSIS — M4317 Spondylolisthesis, lumbosacral region: Secondary | ICD-10-CM | POA: Diagnosis not present

## 2016-12-26 DIAGNOSIS — G4733 Obstructive sleep apnea (adult) (pediatric): Secondary | ICD-10-CM | POA: Diagnosis not present

## 2017-01-07 DIAGNOSIS — H1013 Acute atopic conjunctivitis, bilateral: Secondary | ICD-10-CM | POA: Diagnosis not present

## 2017-01-07 DIAGNOSIS — M5136 Other intervertebral disc degeneration, lumbar region: Secondary | ICD-10-CM | POA: Diagnosis not present

## 2017-01-18 DIAGNOSIS — D638 Anemia in other chronic diseases classified elsewhere: Secondary | ICD-10-CM | POA: Diagnosis not present

## 2017-01-18 DIAGNOSIS — E785 Hyperlipidemia, unspecified: Secondary | ICD-10-CM | POA: Diagnosis not present

## 2017-01-18 DIAGNOSIS — I10 Essential (primary) hypertension: Secondary | ICD-10-CM | POA: Diagnosis not present

## 2017-01-18 DIAGNOSIS — I2510 Atherosclerotic heart disease of native coronary artery without angina pectoris: Secondary | ICD-10-CM | POA: Diagnosis not present

## 2017-01-18 DIAGNOSIS — I5032 Chronic diastolic (congestive) heart failure: Secondary | ICD-10-CM | POA: Diagnosis not present

## 2017-01-18 DIAGNOSIS — E039 Hypothyroidism, unspecified: Secondary | ICD-10-CM | POA: Diagnosis not present

## 2017-01-18 DIAGNOSIS — E538 Deficiency of other specified B group vitamins: Secondary | ICD-10-CM | POA: Diagnosis not present

## 2017-01-18 DIAGNOSIS — Z139 Encounter for screening, unspecified: Secondary | ICD-10-CM | POA: Diagnosis not present

## 2017-01-26 DIAGNOSIS — G4733 Obstructive sleep apnea (adult) (pediatric): Secondary | ICD-10-CM | POA: Diagnosis not present

## 2017-02-10 DIAGNOSIS — Z1231 Encounter for screening mammogram for malignant neoplasm of breast: Secondary | ICD-10-CM | POA: Diagnosis not present

## 2017-02-11 ENCOUNTER — Encounter: Payer: Self-pay | Admitting: Emergency Medicine

## 2017-02-11 ENCOUNTER — Ambulatory Visit (INDEPENDENT_AMBULATORY_CARE_PROVIDER_SITE_OTHER): Payer: Medicare Other | Admitting: Emergency Medicine

## 2017-02-11 DIAGNOSIS — J301 Allergic rhinitis due to pollen: Secondary | ICD-10-CM

## 2017-02-11 DIAGNOSIS — R059 Cough, unspecified: Secondary | ICD-10-CM

## 2017-02-11 DIAGNOSIS — G4733 Obstructive sleep apnea (adult) (pediatric): Secondary | ICD-10-CM | POA: Diagnosis not present

## 2017-02-11 DIAGNOSIS — Z23 Encounter for immunization: Secondary | ICD-10-CM

## 2017-02-11 DIAGNOSIS — R05 Cough: Secondary | ICD-10-CM

## 2017-02-11 NOTE — Patient Instructions (Addendum)
We will perform a home sleep study. Once this is done we can order a new CPAp machine and nasal pillows for you Flu shot and Prevnar 13 pneumonia shot today Stop Dulera Increase omeprazole back to 20 ng twice a day Continue your steroid nasal spray. Use this 1 hour before bedtime, not right at bedtime. Continue loratadine 10 mg daily Keep albuterol available to use for coughing or wheezing Follow with Dr Lamonte Sakai in 3 months or sooner if you have any problems.

## 2017-02-11 NOTE — Assessment & Plan Note (Signed)
I tried ordering nasal pillows, she was able to get this because she owns her machine and does not have a recent polysomnogram documented. I will perform a home sleep study, document that she has obstructive sleep apnea. Once this is done we will get her her equipment and I will change her to AutoSet CPAP

## 2017-02-11 NOTE — Assessment & Plan Note (Signed)
Continue current regimen

## 2017-02-11 NOTE — Assessment & Plan Note (Addendum)
Increase omeprazole back to 20 mg twice a day No evidence of obstructive lung disease on spirometry. She believes that she benefits from albuterol, helps her throat wheezing, helps her cough. Okay with me to continue this as needed. I have advised her to stop Dulera altogether.

## 2017-02-11 NOTE — Progress Notes (Signed)
Subjective:    Patient ID: Jody Taylor, female    DOB: 1941/02/09, 76 y.o.   MRN: [id number]  HPI 76 year old woman with a history of hypertension, diastolic dysfunction, coronary artery disease, hypothyroidism, GERD. She has OSA but only uses CPAP occasionally. She is referred today for eval of dyspnea and wheezing. She reports that she has had slowly progressive exertional SOB over the last 2 yrs. She has also been hearing some wheeze vs UA noise for the year. No chest pain. She has a lot of cough, happens mainly at night. Non-productive. Has benefited from tussionex. She does have DuoNeb that she rarely uses. She uses albuterol HFA at night -  May help her some. She has been on Dulera qd for 2 years, recently changed to symbicort 2 weeks ago. She has astelin NS, takes prn. She has GERD sx, seems to be controlled on PPI.   She does snore some. Not when she uses the CPAP.   ROV 10/21/16 -- This follow-up visit for evaluation of wheezing and associated shortness of breath. She has been hearing upper airway noise versus wheeze for several months. Also describes shortness of breath with exertion. She has obstructive sleep apnea and uses CPAP unreliably. She had been treated empirically for possible asthma, we stopped her ICS/LABA at our initial visit. She is also being treated for chronic rhinitis and GERD. We increased her omeprazole, added loratadine, added fluticasone nasal spray last visit. She underwent pulmonary function testing today that I have personally reviewed. This showsNormal airflows, no response to bronchodilator, normal lung volumes. She has a decreased diffusion capacity that corrects to the normal range when adjusted for alveolar volume. No evidence to support asthma.  ROV 02/11/17 -- Patient has a history of upper airway noise, chronic cough sleep apnea. No evidence on spirometry to support lower airways disease or asthma. She has been managed with omeprazole qd, fluticasone, loratadine  for contributors to her upper airway disease. She uses tusionex at night. Still has cough at night when she lays down. She still has albuterol that she uses as needed. At her last visit we tried starting nasal pillows, was told she would need a new machine, hasn';t gotten any of it yet    Review of Systems  Constitutional: Negative for fever and unexpected weight change.  HENT: Negative for congestion, dental problem, ear pain, nosebleeds, postnasal drip, rhinorrhea, sinus pressure, sneezing, sore throat and trouble swallowing.   Eyes: Negative for redness and itching.  Respiratory: Positive for cough and shortness of breath. Negative for chest tightness and wheezing.   Cardiovascular: Negative for palpitations and leg swelling.  Gastrointestinal: Negative for nausea and vomiting.  Genitourinary: Negative for dysuria.  Musculoskeletal: Negative for joint swelling.  Skin: Negative for rash.  Neurological: Negative for headaches.  Hematological: Does not bruise/bleed easily.  Psychiatric/Behavioral: Negative for dysphoric mood. The patient is not nervous/anxious.    Past Medical History:  Diagnosis Date  . 2-vessel coronary artery disease   . Adult hypothyroidism   . Anemia of chronic disease   . Arthritis   . Chronic diastolic CHF (congestive heart failure) ([place])   . Chronic GERD   . Chronic kidney disease, stage 3 (moderate)    patient denies (listed in 07/20/16 PCP notes-Dr. Nelda Bucks)  . Colon polyp   . Depression    since hysterectomy   . Hyperlipidemia LDL goal <70   . Hypertension, essential, benign   . PONV (postoperative nausea and vomiting)   . Sleep  apnea    wears CPAP  . Stroke [facility])    "i had a mini stroke I didn't even know I had it" found on MRI  . Vitamin D deficiency      History reviewed. No pertinent family history.   Social History   Social History  . Marital status: Single    Spouse name: N/A  . Number of children: N/A  . Years of education: N/A     Occupational History  . Not on file.   Social History Main Topics  . Smoking status: Never Smoker  . Smokeless tobacco: Never Used  . Alcohol use No  . Drug use: No  . Sexual activity: Not on file   Other Topics Concern  . Not on file   Social History Narrative  . No narrative on file     Allergies  Allergen Reactions  . Ace Inhibitors Other (See Comments) and Cough    CHEST PAIN  . Other     Patient reports receiving blood after a miscarriage "years ago". She states she broke out from receiving this blood. Has not received any since  . Codeine Nausea And Vomiting  . Levaquin [Levofloxacin] Nausea And Vomiting     Outpatient Medications Prior to Visit  Medication Sig Dispense Refill  . Albuterol Sulfate 108 (90 Base) MCG/ACT AEPB Inhale 2 puffs into the lungs every 6 (six) hours as needed (wheexing or shortness of breath).     [name] aspirin EC 81 MG tablet Take 81 mg by mouth daily.    [name] atorvastatin (LIPITOR) 80 MG tablet Take 80 mg by mouth daily.    [name] buPROPion (WELLBUTRIN SR) 150 MG 12 hr tablet Take 150 mg by mouth daily. Verified SR    . celecoxib (CELEBREX) 200 MG capsule Take 200 mg by mouth daily as needed for mild pain.     . citalopram (CELEXA) 40 MG tablet Take 40 mg by mouth daily.    . cyclobenzaprine (FLEXERIL) 10 MG tablet Take 1 tablet (10 mg total) by mouth 3 (three) times daily as needed for muscle spasms. 30 tablet 0  . dextromethorphan-guaiFENesin (ROBITUSSIN-DM) 10-100 MG/5ML liquid Take 5 mLs by mouth at bedtime.    . famotidine (PEPCID) 40 MG tablet Take 40 mg by mouth daily.    . fluticasone (FLONASE) 50 MCG/ACT nasal spray Place 2 sprays into both nostrils daily. (Patient taking differently: Place 2 sprays into both nostrils at bedtime. ) 16 g 2  . furosemide (LASIX) 80 MG tablet Take 80 mg by mouth 2 (two) times daily.    [name] ipratropium-albuterol (DUONEB) 0.5-2.5 (3) MG/3ML SOLN Take 3 mLs by nebulization 2 (two) times daily as needed (wheezing or  shortness of breath).     . isosorbide mononitrate (IMDUR) 120 MG 24 hr tablet Take 120 mg by mouth daily.    [name] levothyroxine (SYNTHROID, LEVOTHROID) 50 MCG tablet Take 50 mcg by mouth daily before breakfast.    . metoprolol (LOPRESSOR) 50 MG tablet Take 50 mg by mouth daily. Verified tartrate    . omeprazole (PRILOSEC) 20 MG capsule Take 20 mg by mouth daily.    . potassium chloride SA (K-DUR,KLOR-CON) 20 MEQ tablet Take 40 mEq by mouth daily.    . QUEtiapine (SEROQUEL) 25 MG tablet Take 75 mg by mouth at bedtime. Takes 3 at bedtime    . TURMERIC PO Take 50 mg by mouth 3 (three) times a week.    [name] oxyCODONE-acetaminophen (PERCOCET/ROXICET) 5-325 MG tablet Take 1-2  tablets by mouth every 4 (four) hours as needed for moderate pain. (Patient not taking: Reported on 02/11/2017) 30 tablet 0   No facility-administered medications prior to visit.         Objective:   Physical Exam Vitals:   02/11/17 1047  BP: 116/78  Pulse: 63  SpO2: 94%  Weight: 215 lb 6.4 oz (97.7 kg)  Height: 5\' 3"  (1.6 m)   Gen: Pleasant, obese, in no distress,  normal affect  ENT: No lesions,  mouth clear,  oropharynx clear, no postnasal drip  Neck: No JVD, no stridor, no hoarseness  Lungs: No use of accessory muscles, no wheeze  Cardiovascular: RRR, heart sounds normal, no murmur or gallops, no peripheral edema  Musculoskeletal: No deformities, no cyanosis or clubbing  Neuro: alert, non focal  Skin: Warm, no lesions or rashes      Assessment & Plan:  Obstructive sleep apnea I tried ordering nasal pillows, she was able to get this because she owns her machine and does not have a recent polysomnogram documented. I will perform a home sleep study, document that she has obstructive sleep apnea. Once this is done we will get her her equipment and I will change her to AutoSet CPAP  Allergic rhinitis Continue current regimen  Cough Increase omeprazole back to 20 mg twice a day No evidence of obstructive  lung disease on spirometry. She believes that she benefits from albuterol, helps her throat wheezing, helps her cough. Okay with me to continue this as needed. I have advised her to stop Dulera altogether.  Baltazar Apo, MD, PhD 02/11/2017, 11:04 AM [facility] [phone] or if no answer [phone]

## 2017-02-18 DIAGNOSIS — H2703 Aphakia, bilateral: Secondary | ICD-10-CM | POA: Diagnosis not present

## 2017-02-19 ENCOUNTER — Other Ambulatory Visit: Payer: Self-pay | Admitting: Emergency Medicine

## 2017-02-19 DIAGNOSIS — G4733 Obstructive sleep apnea (adult) (pediatric): Secondary | ICD-10-CM

## 2017-02-26 DIAGNOSIS — G4733 Obstructive sleep apnea (adult) (pediatric): Secondary | ICD-10-CM | POA: Diagnosis not present

## 2017-03-25 DIAGNOSIS — J441 Chronic obstructive pulmonary disease with (acute) exacerbation: Secondary | ICD-10-CM | POA: Diagnosis not present

## 2017-03-25 DIAGNOSIS — H1013 Acute atopic conjunctivitis, bilateral: Secondary | ICD-10-CM | POA: Diagnosis not present

## 2017-03-26 ENCOUNTER — Telehealth: Payer: Self-pay | Admitting: Cardiology

## 2017-03-26 NOTE — Telephone Encounter (Signed)
Patient states she takes Lasix 80 mg bid.  Weight has increased about 5 pounds in a few days. Patient is short of breath. No chest pain.  Patient has not seen you in this office yet, but does plan to come.  Please advise.

## 2017-03-26 NOTE — Telephone Encounter (Signed)
Left message to return call 

## 2017-03-26 NOTE — Telephone Encounter (Signed)
Take an extra furosemide = 40 mg day until back to baseline

## 2017-03-26 NOTE — Telephone Encounter (Signed)
Patient advised to take an extra 40 mg daily until reach baseline weight. Appointment scheduled for 05/07/17 at 9 am.

## 2017-03-26 NOTE — Telephone Encounter (Signed)
Has not been seen by [facility] since we moved but states her fluid pill is not working

## 2017-03-31 DIAGNOSIS — E039 Hypothyroidism, unspecified: Secondary | ICD-10-CM | POA: Insufficient documentation

## 2017-03-31 DIAGNOSIS — M1990 Unspecified osteoarthritis, unspecified site: Secondary | ICD-10-CM | POA: Insufficient documentation

## 2017-03-31 DIAGNOSIS — G4730 Sleep apnea, unspecified: Secondary | ICD-10-CM | POA: Insufficient documentation

## 2017-03-31 DIAGNOSIS — C801 Malignant (primary) neoplasm, unspecified: Secondary | ICD-10-CM

## 2017-03-31 DIAGNOSIS — I5032 Chronic diastolic (congestive) heart failure: Secondary | ICD-10-CM | POA: Insufficient documentation

## 2017-03-31 DIAGNOSIS — N183 Chronic kidney disease, stage 3 unspecified: Secondary | ICD-10-CM | POA: Insufficient documentation

## 2017-03-31 DIAGNOSIS — E785 Hyperlipidemia, unspecified: Secondary | ICD-10-CM | POA: Insufficient documentation

## 2017-03-31 DIAGNOSIS — K635 Polyp of colon: Secondary | ICD-10-CM | POA: Insufficient documentation

## 2017-03-31 DIAGNOSIS — I110 Hypertensive heart disease with heart failure: Secondary | ICD-10-CM | POA: Insufficient documentation

## 2017-03-31 DIAGNOSIS — K219 Gastro-esophageal reflux disease without esophagitis: Secondary | ICD-10-CM | POA: Insufficient documentation

## 2017-03-31 DIAGNOSIS — D638 Anemia in other chronic diseases classified elsewhere: Secondary | ICD-10-CM | POA: Insufficient documentation

## 2017-03-31 DIAGNOSIS — Z9889 Other specified postprocedural states: Secondary | ICD-10-CM

## 2017-03-31 DIAGNOSIS — R112 Nausea with vomiting, unspecified: Secondary | ICD-10-CM | POA: Insufficient documentation

## 2017-03-31 DIAGNOSIS — E559 Vitamin D deficiency, unspecified: Secondary | ICD-10-CM | POA: Insufficient documentation

## 2017-03-31 DIAGNOSIS — I2510 Atherosclerotic heart disease of native coronary artery without angina pectoris: Secondary | ICD-10-CM | POA: Insufficient documentation

## 2017-03-31 HISTORY — DX: Malignant (primary) neoplasm, unspecified: C80.1

## 2017-04-08 DIAGNOSIS — R0600 Dyspnea, unspecified: Secondary | ICD-10-CM | POA: Diagnosis not present

## 2017-04-08 DIAGNOSIS — I700 Atherosclerosis of aorta: Secondary | ICD-10-CM | POA: Diagnosis not present

## 2017-04-08 DIAGNOSIS — I5032 Chronic diastolic (congestive) heart failure: Secondary | ICD-10-CM | POA: Diagnosis not present

## 2017-04-08 DIAGNOSIS — J449 Chronic obstructive pulmonary disease, unspecified: Secondary | ICD-10-CM | POA: Diagnosis not present

## 2017-04-08 DIAGNOSIS — R0602 Shortness of breath: Secondary | ICD-10-CM | POA: Diagnosis not present

## 2017-04-10 ENCOUNTER — Encounter (HOSPITAL_BASED_OUTPATIENT_CLINIC_OR_DEPARTMENT_OTHER): Payer: Medicare Other

## 2017-04-14 ENCOUNTER — Telehealth: Payer: Self-pay

## 2017-04-14 NOTE — Telephone Encounter (Signed)
Fax received from [employer] of patient's weight increased by about 3 pounds with increasing shortness of breath. Reviewed by Dr. Bettina Gavia and he advised to add an extra 40 mg of furosemide to patient's daily dose until returns to baseline weight. Patient advised of dosing instructions and verbalized understanding. No further questions.

## 2017-04-17 ENCOUNTER — Ambulatory Visit: Payer: Medicare Other | Admitting: Cardiology

## 2017-04-17 ENCOUNTER — Ambulatory Visit (INDEPENDENT_AMBULATORY_CARE_PROVIDER_SITE_OTHER): Payer: Medicare Other | Admitting: Cardiology

## 2017-04-17 ENCOUNTER — Encounter: Payer: Self-pay | Admitting: Cardiology

## 2017-04-17 VITALS — BP 120/70 | HR 70 | Ht 63.0 in | Wt 218.0 lb

## 2017-04-17 DIAGNOSIS — E785 Hyperlipidemia, unspecified: Secondary | ICD-10-CM

## 2017-04-17 DIAGNOSIS — I25119 Atherosclerotic heart disease of native coronary artery with unspecified angina pectoris: Secondary | ICD-10-CM

## 2017-04-17 DIAGNOSIS — I5032 Chronic diastolic (congestive) heart failure: Secondary | ICD-10-CM | POA: Diagnosis not present

## 2017-04-17 MED ORDER — TORSEMIDE 100 MG PO TABS: 50.0000 mg | ORAL_TABLET | Freq: Two times a day (BID) | ORAL | 6 refills | Status: DC

## 2017-04-17 MED ORDER — TORSEMIDE 100 MG PO TABS: 50.0000 mg | ORAL_TABLET | Freq: Two times a day (BID) | ORAL | 3 refills | Status: DC

## 2017-04-17 NOTE — Progress Notes (Signed)
Cardiology Office Note:    Date:  04/17/2017   ID:  Jody Taylor, DOB 1940/10/09, MRN [id number]  PCP:  Nicoletta Dress, MD  Cardiologist:  Shirlee More, MD    Referring MD: Nicoletta Dress, MD    ASSESSMENT:    1. Chronic diastolic CHF (congestive heart failure) ([place])   2. Coronary artery disease involving native coronary artery of native heart with angina pectoris ([place])   3. Hyperlipidemia LDL goal <70    PLAN:    In order of problems listed above:  1. Mildly decompensated switch to more effective dependable loop diuretic check BMP and BNP and reassess in the office in 3 weeks. 2. Stable continue medical treatment 3. Stable continue her statin   Next appointment: 3 weeks   Medication Adjustments/Labs and Tests Ordered: Current medicines are reviewed at length with the patient today.  Concerns regarding medicines are outlined above.  Orders Placed This Encounter  Procedures  . Basic Metabolic Panel (BMET)  . B Nat Peptide  . D-Dimer, Quantitative  . EKG 12-Lead   Meds ordered this encounter  Medications  . DISCONTD: torsemide (DEMADEX) 100 MG tablet    Sig: Take 0.5 tablets (50 mg total) by mouth 2 (two) times daily.    Dispense:  30 tablet    Refill:  6  . torsemide (DEMADEX) 100 MG tablet    Sig: Take 0.5 tablets (50 mg total) by mouth 2 (two) times daily.    Dispense:  90 tablet    Refill:  3    Chief Complaint  Patient presents with  . Shortness of Breath    wheezing/ onset 3 weeks  . Congestive Heart Failure    History of Present Illness:    Jody Taylor is a 76 y.o. female with a hx of CAD, CHF, Dyslipidemia, HTN  last seen in November 2017. Compliance with diet, lifestyle and medications: Yes Her weight had increased 4-5 pounds and she was increasingly short of breath.  She increased her loop diuretic 50% her weight is decreased almost to baseline but she still remains short of breath and is not having edema.  She also has increasing  bronchospasm but no fever or sputum and has had an outpatient chest x-ray.  Recent labs and x-rays requested from her PCP.  She has had no chest pain palpitation or syncope. Past Medical History:  Diagnosis Date  . 2-vessel coronary artery disease   . Adult hypothyroidism   . Allergic rhinitis 09/12/2016  . Anemia of chronic disease   . Arthritis   . CAD in native artery 05/02/2015   Overview:   S/P PCI and stent x 2. Last cardiac cath August 2010 with mild nonobstructive CAD and normal LV function PCI and stent of proximal [facility] 1999 Cath Nov 2016:Angiographic findings Cardiac Arteries and Lesion Findings LMCA: Normal. LAD: Normal. LCx: Normal. RCA: Abnormal. Lesion on R PDA: Ostial.35% stenosis 5 mm length . Pre procedure TIMI III flow was noted. Good run off was present.Bifurcation lesion.  Cath 05/08/15:Mild non-obstructive coronary artery disease. Normal LV function  . Cancer ([place]) 03/31/2017  . Chronic diastolic CHF (congestive heart failure) ([place])   . Chronic GERD   . Chronic kidney disease, stage 3 (moderate) ([place])    patient denies (listed in 07/20/16 PCP notes-Dr. Nelda Bucks)  . CKD (chronic kidney disease) 06/05/2015  . Colon polyp   . Cough 09/12/2016  . Depression    since hysterectomy   . Dyspnea 09/12/2016  .  Hallucinations 08/17/2013  . Hyperlipidemia LDL goal <70   . Hypertension, essential, benign   . Obstructive sleep apnea 09/12/2016  . PONV (postoperative nausea and vomiting)   . PONV (postoperative nausea and vomiting)   . 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 Sleep apnea    wears CPAP  . Spondylolisthesis of lumbosacral region 11/07/2016  . Stroke [facility])    "i had a mini stroke I didn't even know I had it" found on MRI  . Vaginal atrophy 01/30/2016  . Vitamin D deficiency   . Wheezing 09/12/2016    Past Surgical History:  Procedure Laterality Date  . APPENDECTOMY    . BACK SURGERY  11/07/2016   Lumbar region/ 4 screws and 2 rods  . CAROTID STENT    .  CHOLECYSTECTOMY    . CORONARY ANGIOPLASTY WITH STENT PLACEMENT    . CORONARY BALLOON ANGIOPLASTY    . TOTAL ABDOMINAL HYSTERECTOMY      Current Medications: Current Meds  Medication Sig  . Albuterol Sulfate 108 (90 Base) MCG/ACT AEPB Inhale 2 puffs into the lungs every 6 (six) hours as needed (wheexing or shortness of breath).   [name] aspirin EC 81 MG tablet Take 81 mg by mouth daily.  [name] atorvastatin (LIPITOR) 80 MG tablet Take 80 mg by mouth daily.  [name] buPROPion (WELLBUTRIN SR) 150 MG 12 hr tablet Take 150 mg by mouth daily. Verified SR  . celecoxib (CELEBREX) 200 MG capsule Take 200 mg by mouth daily as needed for mild pain.   . citalopram (CELEXA) 40 MG tablet Take 40 mg by mouth daily.  . cyclobenzaprine (FLEXERIL) 10 MG tablet Take 1 tablet (10 mg total) by mouth 3 (three) times daily as needed for muscle spasms.  [name] dextromethorphan-guaiFENesin (ROBITUSSIN-DM) 10-100 MG/5ML liquid Take 5 mLs by mouth at bedtime.  . famotidine (PEPCID) 40 MG tablet Take 40 mg by mouth daily.  . fluticasone (FLONASE) 50 MCG/ACT nasal spray Place 2 sprays into both nostrils daily. (Patient taking differently: Place 2 sprays into both nostrils at bedtime. )  . gabapentin (NEURONTIN) 300 MG capsule daily.  [name] ipratropium-albuterol (DUONEB) 0.5-2.5 (3) MG/3ML SOLN Take 3 mLs by nebulization 2 (two) times daily as needed (wheezing or shortness of breath).   . isosorbide mononitrate (IMDUR) 120 MG 24 hr tablet Take 120 mg by mouth daily.  [name] levothyroxine (SYNTHROID, LEVOTHROID) 50 MCG tablet Take 50 mcg by mouth daily before breakfast.  . metoprolol (LOPRESSOR) 50 MG tablet Take 50 mg by mouth daily. Verified tartrate  . omeprazole (PRILOSEC) 20 MG capsule Take 20 mg by mouth daily.  [name] oxyCODONE-acetaminophen (PERCOCET/ROXICET) 5-325 MG tablet Take 1-2 tablets by mouth every 4 (four) hours as needed for moderate pain.  . potassium chloride SA (K-DUR,KLOR-CON) 20 MEQ tablet Take 40 mEq by mouth daily.  . QUEtiapine  (SEROQUEL) 25 MG tablet Take 75 mg by mouth at bedtime. Takes 3 at bedtime  . TURMERIC PO Take 50 mg by mouth 3 (three) times a week.  . [DISCONTINUED] furosemide (LASIX) 80 MG tablet Take 80 mg by mouth 2 (two) times daily.     Allergies:   Ace inhibitors; Other; Codeine; and Levaquin [levofloxacin]   Social History   Social History  . Marital status: Single    Spouse name: N/A  . Number of children: N/A  . Years of education: N/A   Social History Main Topics  . Smoking status: Former Smoker    Quit date: 02/14/1965  . Smokeless tobacco: Never Used  .  Alcohol use No  . Drug use: No  . Sexual activity: Not Asked   Other Topics Concern  . None   Social History Narrative  . None     Family History: The patient's family history includes Diabetes in her mother; Heart disease in her father; Stroke in her father and mother. ROS:   Please see the history of present illness.    All other systems reviewed and are negative.  EKGs/Labs/Other Studies Reviewed:    The following studies were reviewed today:  EKG:  EKG ordered today.  The ekg ordered today demonstrates [place] first degree AVB OW normal  Recent Labs: recent labs, CXR requested 11/08/2016: BUN 19; Creatinine, Ser 1.64; Hemoglobin 10.9; Platelets 239; Potassium 4.6; Sodium 137  Recent Lipid Panel No results found for: CHOL, TRIG, HDL, CHOLHDL, VLDL, LDLCALC, LDLDIRECT  Physical Exam:    VS:  BP 120/70 (BP Location: Left Arm, Patient Position: Sitting, Cuff Size: Normal)   Pulse 70   Ht 5\' 3"  (1.6 m)   Wt 218 lb (98.9 kg)   LMP  (LMP Unknown)   SpO2 97%   BMI 38.62 kg/m     Wt Readings from Last 3 Encounters:  04/17/17 218 lb (98.9 kg)  02/11/17 215 lb 6.4 oz (97.7 kg)  11/07/16 215 lb 13.3 oz (97.9 kg)     GEN:  Well nourished, well developed in no acute distress HEENT: Normal NECK: No JVD; No carotid bruits LYMPHATICS: No lymphadenopathy CARDIAC: RRR, no murmurs, rubs, gallops RESPIRATORY: No rales,  expiratory wheezing is present ABDOMEN: Soft, non-tender, non-distended MUSCULOSKELETAL:  No edema; No deformity  SKIN: Warm and dry NEUROLOGIC:  Alert and oriented x 3 PSYCHIATRIC:  Normal affect    Signed, Shirlee More, MD  04/17/2017 12:30 PM    [facility]

## 2017-04-17 NOTE — Patient Instructions (Addendum)
Medication Instructions:  Your physician has recommended you make the following change in your medication:  STOP furosemide START torsemide 50 mg (0.5 tablet) twice daily  Labwork: Your physician recommends that you return for lab work in: today. BMP, BNP, d-dimer  Testing/Procedures: You had an EKG today.  Follow-Up: Your physician recommends that you schedule a follow-up appointment in: 3 weeks.  Any Other Special Instructions Will Be Listed Below (If Applicable).     If you need a refill on your cardiac medications before your next appointment, please call your pharmacy.    Heart Failure  Weigh yourself every morning when you first wake up and record on a calender or note pad, bring this to your office visits. Using a pill tender can help with taking your medications consistently.  Limit your fluid intake to 2 liters daily  Limit your sodium intake to less than 2-3 grams daily. Ask if you need dietary teaching.  If you gain more than 3 pounds (from your dry weight ), double your dose of diuretic for the day.  If you gain more than 5 pounds (from your dry weight), double your dose of lasix and call your heart failure doctor.  Please do not smoke tobacco since it is very bad for your heart.  Please do not drink alcohol since it can worsen your heart failure.Also avoid OTC nonsteroidal drugs, such as advil, aleve and motrin.  Try to exercise for at least 30 minutes every day because this will help your heart be more efficient. You may be eligible for supervised cardiac rehab, ask your physician.

## 2017-04-18 ENCOUNTER — Encounter: Payer: Self-pay | Admitting: Cardiology

## 2017-04-18 ENCOUNTER — Telehealth: Payer: Self-pay

## 2017-04-18 ENCOUNTER — Other Ambulatory Visit: Payer: Self-pay | Admitting: Cardiology

## 2017-04-18 DIAGNOSIS — R7989 Other specified abnormal findings of blood chemistry: Secondary | ICD-10-CM | POA: Diagnosis not present

## 2017-04-18 DIAGNOSIS — R0602 Shortness of breath: Secondary | ICD-10-CM | POA: Diagnosis not present

## 2017-04-18 DIAGNOSIS — R0600 Dyspnea, unspecified: Secondary | ICD-10-CM | POA: Diagnosis not present

## 2017-04-18 LAB — BASIC METABOLIC PANEL
BUN/Creatinine Ratio: 14 (ref 12–28)
BUN: 27 mg/dL (ref 8–27)
CALCIUM: 8.7 mg/dL (ref 8.7–10.3)
CHLORIDE: 104 mmol/L (ref 96–106)
CO2: 27 mmol/L (ref 20–29)
Creatinine, Ser: 1.91 mg/dL — ABNORMAL HIGH (ref 0.57–1.00)
GFR calc Af Amer: 29 mL/min/{1.73_m2} — ABNORMAL LOW (ref >59)
GFR calc non Af Amer: 25 mL/min/{1.73_m2} — ABNORMAL LOW (ref >59)
Glucose: 104 mg/dL — ABNORMAL HIGH (ref 65–99)
POTASSIUM: 4.3 mmol/L (ref 3.5–5.2)
Sodium: 145 mmol/L — ABNORMAL HIGH (ref 134–144)

## 2017-04-18 LAB — D-DIMER, QUANTITATIVE (NOT AT ARMC): D-DIMER: 1.17 mg{FEU}/L — AB (ref 0.00–0.49)

## 2017-04-18 LAB — BRAIN NATRIURETIC PEPTIDE: BNP: 87.2 pg/mL (ref 0.0–100.0)

## 2017-04-18 NOTE — Telephone Encounter (Signed)
-----   Message from Richardo Priest, MD sent at 04/18/2017  8:23 AM EDT ----- Her DD is elevated, I am concerned with DVT/PE. Have a venous duplex re DVT lower extremity bilateral and lung scan nuclear med today she can do at [facility]

## 2017-04-18 NOTE — Telephone Encounter (Signed)
Patient advised [facility] aware she is coming and asked her to be there around 1 pm at the outpatient center. Patient verbalized understanding and was leaving as we got off the phone for VQ scan and BLE duplex.

## 2017-04-18 NOTE — Telephone Encounter (Signed)
-----   Message from Richardo Priest, MD sent at 04/18/2017  8:23 AM EDT ----- Her DD is elevated, I am concerned with DVT/PE. Have a venous duplex re DVT lower extremity bilateral and lung scan nuclear med today she can do at [facility]

## 2017-04-22 ENCOUNTER — Telehealth: Payer: Self-pay | Admitting: Cardiology

## 2017-04-22 NOTE — Telephone Encounter (Signed)
Still c/o sob, wt is up 3 pounds

## 2017-04-22 NOTE — Telephone Encounter (Signed)
Torsemide 100 AM/ 50AM

## 2017-04-22 NOTE — Telephone Encounter (Signed)
Patient advised to increase torsemide to 100 mg (1 tablet) in the morning and 50 mg  (0.5 tablet) in the evening. Patient verbalized understanding. Harmon Pier with [facility] also made aware.

## 2017-04-22 NOTE — Telephone Encounter (Signed)
Please advise 

## 2017-04-28 ENCOUNTER — Telehealth: Payer: Self-pay

## 2017-04-28 DIAGNOSIS — I82442 Acute embolism and thrombosis of left tibial vein: Secondary | ICD-10-CM

## 2017-04-28 NOTE — Telephone Encounter (Signed)
Patient advised repeat vascular ultrasound on left leg scheduled at [facility] on 04/30/17 at 9 am, patient to arrive at 8:30 am. Advised patient to go to the same location she went 2 weeks ago. Patient verbalized understanding of appointment date and time. No further questions.

## 2017-04-30 ENCOUNTER — Telehealth: Payer: Self-pay

## 2017-04-30 DIAGNOSIS — M7989 Other specified soft tissue disorders: Secondary | ICD-10-CM | POA: Diagnosis not present

## 2017-04-30 DIAGNOSIS — I82442 Acute embolism and thrombosis of left tibial vein: Secondary | ICD-10-CM | POA: Diagnosis not present

## 2017-04-30 NOTE — Telephone Encounter (Signed)
Patient informed of results. Patient aware of not starting anticoagulation and agrees not to start. No further questions.

## 2017-04-30 NOTE — Telephone Encounter (Signed)
Left message to return call on cell phone for vascular ultrasound results done at [facility] this morning. Dr. Bettina Gavia states unchanged and would not start anticoagulation. Will inform patient when she returns call.

## 2017-05-06 DIAGNOSIS — M4317 Spondylolisthesis, lumbosacral region: Secondary | ICD-10-CM | POA: Diagnosis not present

## 2017-05-07 ENCOUNTER — Ambulatory Visit: Payer: Medicare Other | Admitting: Cardiology

## 2017-05-12 ENCOUNTER — Ambulatory Visit: Payer: Medicare Other | Admitting: Cardiology

## 2017-05-12 ENCOUNTER — Encounter: Payer: Self-pay | Admitting: Cardiology

## 2017-05-12 VITALS — BP 118/68 | HR 76 | Ht 63.0 in | Wt 216.0 lb

## 2017-05-12 DIAGNOSIS — I5032 Chronic diastolic (congestive) heart failure: Secondary | ICD-10-CM | POA: Diagnosis not present

## 2017-05-12 NOTE — Patient Instructions (Addendum)
Medication Instructions:  Your physician recommends that you continue on your current medications as directed. Please refer to the Current Medication list given to you today.  Labwork: Your physician recommends that you return for lab work in: today. BMP, BNP  Testing/Procedures: None  Follow-Up: Your physician wants you to follow-up in: 8 months (July, 2019). You will receive a reminder letter in the mail two months in advance. If you don't receive a letter, please call our office to schedule the follow-up appointment.  Any Other Special Instructions Will Be Listed Below (If Applicable).     If you need a refill on your cardiac medications before your next appointment, please call your pharmacy.    Drink 6-8 ounces of tonic water with quinine at bedtime for leg cramps [place] dry or [place]  Leg Cramps Leg cramps occur when a muscle or muscles tighten and you have no control over this tightening (involuntary muscle contraction). Muscle cramps can develop in any muscle, but the most common place is in the calf muscles of the leg. Those cramps can occur during exercise or when you are at rest. Leg cramps are painful, and they may last for a few seconds to a few minutes. Cramps may return several times before they finally stop. Usually, leg cramps are not caused by a serious medical problem. In many cases, the cause is not known. Some common causes include:  Overexertion.  Overuse from repetitive motions, or doing the same thing over and over.  Remaining in a certain position for a long period of time.  Improper preparation, form, or technique while performing a sport or an activity.  Dehydration.  Injury.  Side effects of some medicines.  Abnormally low levels of the salts and ions in your blood (electrolytes), especially potassium and calcium. These levels could be low if you are taking water pills (diuretics) or if you are pregnant.  Follow these instructions at  home: Watch your condition for any changes. Taking the following actions may help to lessen any discomfort that you are feeling:  Stay well-hydrated. Drink enough fluid to keep your urine clear or pale yellow.  Try massaging, stretching, and relaxing the affected muscle. Do this for several minutes at a time.  For tight or tense muscles, use a warm towel, heating pad, or hot shower water directed to the affected area.  If you are sore or have pain after a cramp, applying ice to the affected area may relieve discomfort. ? Put ice in a plastic bag. ? Place a towel between your skin and the bag. ? Leave the ice on for 20 minutes, 2-3 times per day.  Avoid strenuous exercise for several days if you have been having frequent leg cramps.  Make sure that your diet includes the essential minerals for your muscles to work normally.  Take medicines only as directed by your health care provider.  Contact a health care provider if:  Your leg cramps get more severe or more frequent, or they do not improve over time.  Your foot becomes cold, numb, or blue. This information is not intended to replace advice given to you by your health care provider. Make sure you discuss any questions you have with your health care provider. Document Released: 07/11/2004 Document Revised: 11/09/2015 Document Reviewed: 05/11/2014 Elsevier Interactive Patient Education  [employer].

## 2017-05-12 NOTE — Progress Notes (Signed)
Cardiology Office Note:    Date:  05/12/2017   ID:  [name], DOB August 20, 1940, MRN [id number]  PCP:  Nicoletta Dress, MD  Cardiologist:  Shirlee More, MD    Referring MD: Nicoletta Dress, MD    ASSESSMENT:    1. Chronic diastolic CHF (congestive heart failure) (HCC)    PLAN:    In order of problems listed above:  1. Improved, she will continue her current diuretic she is at risk of worsening of renal function and potassium abnormality especially leg cramps and will recheck renal function BMP BNP today.  I will plan to see back in the office in 6 months or sooner if her heart failure is again decompensated .  Her CAD and hypertension remained stable   Next appointment: 6 months    Medication Adjustments/Labs and Tests Ordered: Current medicines are reviewed at length with the patient today.  Concerns regarding medicines are outlined above.  No orders of the defined types were placed in this encounter.  No orders of the defined types were placed in this encounter.   Chief Complaint  Patient presents with  . Follow-up    History of Present Illness:    Jody Taylor is a 76 y.o. female with a hx of CAD, diastolic CHF, Dyslipidemia, HTN   last seen 2 weeks ago with decompensated HF. Compliance with diet, lifestyle and medications: Yes She is improved weight is down 70 pounds no longer short of breath when she finds herself to be weak and having increasing leg cramps and restless legs.  No orthopnea chest pain palpitation or syncope.  With her baseline CKD will recheck renal function and BNP level today. Past Medical History:  Diagnosis Date  . 2-vessel coronary artery disease   . Adult hypothyroidism   . Allergic rhinitis 09/12/2016  . Anemia of chronic disease   . Arthritis   . CAD in native artery 05/02/2015   Overview:   S/P PCI and stent x 2. Last cardiac cath August 2010 with mild nonobstructive CAD and normal LV function PCI and stent of proximal [facility] 1999  Cath Nov 2016:Angiographic findings Cardiac Arteries and Lesion Findings LMCA: Normal. LAD: Normal. LCx: Normal. RCA: Abnormal. Lesion on R PDA: Ostial.35% stenosis 5 mm length . Pre procedure TIMI III flow was noted. Good run off was present.Bifurcation lesion.  Cath 05/08/15:Mild non-obstructive coronary artery disease. Normal LV function  . Cancer ([place]) 03/31/2017  . Chronic diastolic CHF (congestive heart failure) ([place])   . Chronic GERD   . Chronic kidney disease, stage 3 (moderate) ([place])    patient denies (listed in 07/20/16 PCP notes-Dr. Nelda Bucks)  . CKD (chronic kidney disease) 06/05/2015  . Colon polyp   . Cough 09/12/2016  . Depression    since hysterectomy   . Dyspnea 09/12/2016  . Hallucinations 08/17/2013  . Hyperlipidemia LDL goal <70   . Hypertension, essential, benign   . Obstructive sleep apnea 09/12/2016  . PONV (postoperative nausea and vomiting)   . PONV (postoperative nausea and vomiting)   . 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 Sleep apnea    wears CPAP  . Spondylolisthesis of lumbosacral region 11/07/2016  . Stroke [facility])    "i had a mini stroke I didn't even know I had it" found on MRI  . Vaginal atrophy 01/30/2016  . Vitamin D deficiency   . Wheezing 09/12/2016    Past Surgical History:  Procedure Laterality Date  . APPENDECTOMY    . BACK  SURGERY  11/07/2016   Lumbar region/ 4 screws and 2 rods  . CAROTID STENT    . CHOLECYSTECTOMY    . CORONARY ANGIOPLASTY WITH STENT PLACEMENT    . CORONARY BALLOON ANGIOPLASTY    . TOTAL ABDOMINAL HYSTERECTOMY      Current Medications: Current Meds  Medication Sig  . Albuterol Sulfate 108 (90 Base) MCG/ACT AEPB Inhale 2 puffs into the lungs every 6 (six) hours as needed (wheexing or shortness of breath).   [name] aspirin EC 81 MG tablet Take 81 mg by mouth daily.  [name] atorvastatin (LIPITOR) 80 MG tablet Take 80 mg by mouth daily.  [name] buPROPion (WELLBUTRIN SR) 150 MG 12 hr tablet Take 150 mg by mouth daily.  Verified SR  . celecoxib (CELEBREX) 200 MG capsule Take 200 mg by mouth daily as needed for mild pain.   . citalopram (CELEXA) 40 MG tablet Take 40 mg by mouth daily.  . cyclobenzaprine (FLEXERIL) 10 MG tablet Take 1 tablet (10 mg total) by mouth 3 (three) times daily as needed for muscle spasms.  [name] dextromethorphan-guaiFENesin (ROBITUSSIN-DM) 10-100 MG/5ML liquid Take 5 mLs by mouth at bedtime.  . famotidine (PEPCID) 40 MG tablet Take 40 mg by mouth daily.  . fluticasone (FLONASE) 50 MCG/ACT nasal spray Place 2 sprays into both nostrils daily. (Patient taking differently: Place 2 sprays into both nostrils at bedtime. )  . gabapentin (NEURONTIN) 300 MG capsule daily.  [name] ipratropium-albuterol (DUONEB) 0.5-2.5 (3) MG/3ML SOLN Take 3 mLs by nebulization 2 (two) times daily as needed (wheezing or shortness of breath).   . isosorbide mononitrate (IMDUR) 120 MG 24 hr tablet Take 120 mg by mouth daily.  [name] levothyroxine (SYNTHROID, LEVOTHROID) 50 MCG tablet Take 50 mcg by mouth daily before breakfast.  . metoprolol (LOPRESSOR) 50 MG tablet Take 50 mg by mouth daily. Verified tartrate  . omeprazole (PRILOSEC) 20 MG capsule Take 20 mg by mouth daily.  . potassium chloride SA (K-DUR,KLOR-CON) 20 MEQ tablet Take 40 mEq by mouth daily.  . QUEtiapine (SEROQUEL) 25 MG tablet Take 75 mg by mouth at bedtime. Takes 3 at bedtime  . torsemide (DEMADEX) 100 MG tablet Take 0.5 tablets (50 mg total) by mouth 2 (two) times daily.  . TURMERIC PO Take 50 mg by mouth 3 (three) times a week.  . [DISCONTINUED] oxyCODONE-acetaminophen (PERCOCET/ROXICET) 5-325 MG tablet Take 1-2 tablets by mouth every 4 (four) hours as needed for moderate pain.     Allergies:   Ace inhibitors; Other; Codeine; and Levaquin [levofloxacin]   Social History   Socioeconomic History  . Marital status: Single    Spouse name: None  . Number of children: None  . Years of education: None  . Highest education level: None  Social Needs  .  Financial resource strain: None  . Food insecurity - worry: None  . Food insecurity - inability: None  . Transportation needs - medical: None  . Transportation needs - non-medical: None  Occupational History  . None  Tobacco Use  . Smoking status: Former Smoker    Last attempt to quit: 02/14/1965    Years since quitting: 52.2  . Smokeless tobacco: Never Used  Substance and Sexual Activity  . Alcohol use: No  . Drug use: No  . Sexual activity: None  Other Topics Concern  . None  Social History Narrative  . None     Family History: The patient's family history includes Diabetes in her mother; Heart disease in her father; Stroke in  her father and mother. ROS:   Please see the history of present illness.    All other systems reviewed and are negative.  EKGs/Labs/Other Studies Reviewed:    The following studies were reviewed today:   Recent Labs:  11/08/2016: Hemoglobin 10.9; Platelets 239 04/17/2017: BNP 87.2; BUN 27; Creatinine, Ser 1.91; Potassium 4.3; Sodium 145  Recent Lipid Panel No results found for: CHOL, TRIG, HDL, CHOLHDL, VLDL, LDLCALC, LDLDIRECT  Physical Exam:    VS:  Ht 5\' 3"  (1.6 m)   Wt 216 lb (98 kg)   LMP  (LMP Unknown)   BMI 38.26 kg/m     Wt Readings from Last 3 Encounters:  05/12/17 216 lb (98 kg)  04/17/17 218 lb (98.9 kg)  02/11/17 215 lb 6.4 oz (97.7 kg)     GEN:  Well nourished, well developed in no acute distress HEENT: Normal NECK: No JVD; No carotid bruits LYMPHATICS: No lymphadenopathy CARDIAC: RRR, no murmurs, rubs, gallops RESPIRATORY:  Clear to auscultation without rales, wheezing or rhonchi  ABDOMEN: Soft, non-tender, non-distended MUSCULOSKELETAL:  No edema; No deformity  SKIN: Warm and dry NEUROLOGIC:  Alert and oriented x 3 PSYCHIATRIC:  Normal affect    Signed, Shirlee More, MD  05/12/2017 2:15 PM    [facility]

## 2017-05-13 ENCOUNTER — Encounter: Payer: Self-pay | Admitting: Emergency Medicine

## 2017-05-13 ENCOUNTER — Ambulatory Visit: Payer: Medicare Other | Admitting: Emergency Medicine

## 2017-05-13 DIAGNOSIS — R05 Cough: Secondary | ICD-10-CM | POA: Diagnosis not present

## 2017-05-13 DIAGNOSIS — J301 Allergic rhinitis due to pollen: Secondary | ICD-10-CM | POA: Diagnosis not present

## 2017-05-13 DIAGNOSIS — R059 Cough, unspecified: Secondary | ICD-10-CM

## 2017-05-13 DIAGNOSIS — G4730 Sleep apnea, unspecified: Secondary | ICD-10-CM | POA: Diagnosis not present

## 2017-05-13 DIAGNOSIS — K219 Gastro-esophageal reflux disease without esophagitis: Secondary | ICD-10-CM

## 2017-05-13 LAB — BASIC METABOLIC PANEL
BUN/Creatinine Ratio: 12 (ref 12–28)
BUN: 22 mg/dL (ref 8–27)
CO2: 25 mmol/L (ref 20–29)
CREATININE: 1.91 mg/dL — AB (ref 0.57–1.00)
Calcium: 9.6 mg/dL (ref 8.7–10.3)
Chloride: 101 mmol/L (ref 96–106)
GFR calc Af Amer: 29 mL/min/{1.73_m2} — ABNORMAL LOW (ref >59)
GFR, EST NON AFRICAN AMERICAN: 25 mL/min/{1.73_m2} — AB (ref >59)
GLUCOSE: 88 mg/dL (ref 65–99)
Potassium: 4.6 mmol/L (ref 3.5–5.2)
Sodium: 142 mmol/L (ref 134–144)

## 2017-05-13 LAB — BRAIN NATRIURETIC PEPTIDE: BNP: 146.9 pg/mL — AB (ref 0.0–100.0)

## 2017-05-13 NOTE — Assessment & Plan Note (Signed)
Significantly better.  Suspect that discontinuation of scheduled Ruthe Mannan has been helpful.  Most beneficial has been the addition of omeprazole.  We will continue this

## 2017-05-13 NOTE — Assessment & Plan Note (Signed)
Continue omeprazole as ordered 

## 2017-05-13 NOTE — Assessment & Plan Note (Signed)
Currently untreated.  She has not been able to tolerate multiple masks.  We discussed a home sleep study and retrying but she decided to defer.  I did explain to her that treatment of her sleep apnea may make her volume status and heart function easier to manage.  We will revisit depending on how she progresses

## 2017-05-13 NOTE — Assessment & Plan Note (Signed)
Review her symptoms.  Continue loratadine once daily

## 2017-05-13 NOTE — Patient Instructions (Signed)
Please continue loratadine once a day Please continue omeprazole 20 mg once a day.  Take this medication 1 hour before or after eating Keep Pro Air available to use 2 puffs if needed for shortness of breath We will hold off on a home sleep study for now.  We will reconsider in the future if we believe that she sleep apnea is contributing to other problems or if we decide to retry CPAP Follow with Dr Lamonte Sakai in 1 year or sooner if you have any other problems

## 2017-05-13 NOTE — Progress Notes (Signed)
Subjective:    Patient ID: Jody Taylor, female    DOB: 1941-02-09, 76 y.o.   MRN: [id number]  HPI 76 year old woman with a history of hypertension, diastolic dysfunction, coronary artery disease, hypothyroidism, GERD. She has OSA but only uses CPAP occasionally. She is referred today for eval of dyspnea and wheezing. She reports that she has had slowly progressive exertional SOB over the last 2 yrs. She has also been hearing some wheeze vs UA noise for the year. No chest pain. She has a lot of cough, happens mainly at night. Non-productive. Has benefited from tussionex. She does have DuoNeb that she rarely uses. She uses albuterol HFA at night -  May help her some. She has been on Dulera qd for 2 years, recently changed to symbicort 2 weeks ago. She has astelin NS, takes prn. She has GERD sx, seems to be controlled on PPI.   She does snore some. Not when she uses the CPAP.   ROV 10/21/16 -- This follow-up visit for evaluation of wheezing and associated shortness of breath. She has been hearing upper airway noise versus wheeze for several months. Also describes shortness of breath with exertion. She has obstructive sleep apnea and uses CPAP unreliably. She had been treated empirically for possible asthma, we stopped her ICS/LABA at our initial visit. She is also being treated for chronic rhinitis and GERD. We increased her omeprazole, added loratadine, added fluticasone nasal spray last visit. She underwent pulmonary function testing today that I have personally reviewed. This showsNormal airflows, no response to bronchodilator, normal lung volumes. She has a decreased diffusion capacity that corrects to the normal range when adjusted for alveolar volume. No evidence to support asthma.  ROV 02/11/17 -- Patient has a history of upper airway noise, chronic cough sleep apnea. No evidence on spirometry to support lower airways disease or asthma. She has been managed with omeprazole qd, fluticasone, loratadine  for contributors to her upper airway disease. She uses tusionex at night. Still has cough at night when she lays down. She still has albuterol that she uses as needed. At her last visit we tried starting nasal pillows, was told she would need a new machine, hasn';t gotten any of it yet   ROV 05/13/17 --this is a follow-up visit for patient with obstructive sleep apnea, upper airway irritation and chronic cough.  No evidence for bronchospasm based on her spirometry.  At her last visit we stopped Dulera, increased omeprazole back to twice a day.  She is also on loratadine. We had discussed doing a home sleep study, but she deferred because she doesn;t believe she will be able to tolerate CPAP. She has increased her diuretics with an improvement in her edema and her breathing. Her cough is better than last visit.    Review of Systems  Constitutional: Negative for fever and unexpected weight change.  HENT: Negative for congestion, dental problem, ear pain, nosebleeds, postnasal drip, rhinorrhea, sinus pressure, sneezing, sore throat and trouble swallowing.   Eyes: Negative for redness and itching.  Respiratory: Positive for cough and shortness of breath. Negative for chest tightness and wheezing.   Cardiovascular: Negative for palpitations and leg swelling.  Gastrointestinal: Negative for nausea and vomiting.  Genitourinary: Negative for dysuria.  Musculoskeletal: Negative for joint swelling.  Skin: Negative for rash.  Neurological: Negative for headaches.  Hematological: Does not bruise/bleed easily.  Psychiatric/Behavioral: Negative for dysphoric mood. The patient is not nervous/anxious.    Past Medical History:  Diagnosis Date  .  2-vessel coronary artery disease   . Adult hypothyroidism   . Allergic rhinitis 09/12/2016  . Anemia of chronic disease   . Arthritis   . CAD in native artery 05/02/2015   Overview:   S/P PCI and stent x 2. Last cardiac cath August 2010 with mild nonobstructive CAD  and normal LV function PCI and stent of proximal [facility] 1999 Cath Nov 2016:Angiographic findings Cardiac Arteries and Lesion Findings LMCA: Normal. LAD: Normal. LCx: Normal. RCA: Abnormal. Lesion on R PDA: Ostial.35% stenosis 5 mm length . Pre procedure TIMI III flow was noted. Good run off was present.Bifurcation lesion.  Cath 05/08/15:Mild non-obstructive coronary artery disease. Normal LV function  . Cancer ([place]) 03/31/2017  . Chronic diastolic CHF (congestive heart failure) ([place])   . Chronic GERD   . Chronic kidney disease, stage 3 (moderate) ([place])    patient denies (listed in 07/20/16 PCP notes-Dr. Nelda Bucks)  . CKD (chronic kidney disease) 06/05/2015  . Colon polyp   . Cough 09/12/2016  . Depression    since hysterectomy   . Dyspnea 09/12/2016  . Hallucinations 08/17/2013  . Hyperlipidemia LDL goal <70   . Hypertension, essential, benign   . Obstructive sleep apnea 09/12/2016  . PONV (postoperative nausea and vomiting)   . PONV (postoperative nausea and vomiting)   . 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 Sleep apnea    wears CPAP  . Spondylolisthesis of lumbosacral region 11/07/2016  . Stroke [facility])    "i had a mini stroke I didn't even know I had it" found on MRI  . Vaginal atrophy 01/30/2016  . Vitamin D deficiency   . Wheezing 09/12/2016     Family History  Problem Relation Age of Onset  . Diabetes Mother   . Stroke Mother   . Heart disease Father   . Stroke Father      Social History   Socioeconomic History  . Marital status: Single    Spouse name: Not on file  . Number of children: Not on file  . Years of education: Not on file  . Highest education level: Not on file  Social Needs  . Financial resource strain: Not on file  . Food insecurity - worry: Not on file  . Food insecurity - inability: Not on file  . Transportation needs - medical: Not on file  . Transportation needs - non-medical: Not on file  Occupational History  . Not on file  Tobacco Use    . Smoking status: Former Smoker    Last attempt to quit: 02/14/1965    Years since quitting: 52.2  . Smokeless tobacco: Never Used  Substance and Sexual Activity  . Alcohol use: No  . Drug use: No  . Sexual activity: Not on file  Other Topics Concern  . Not on file  Social History Narrative  . Not on file     Allergies  Allergen Reactions  . Ace Inhibitors Other (See Comments) and Cough    CHEST PAIN  . Other     Patient reports receiving blood after a miscarriage "years ago". She states she broke out from receiving this blood. Has not received any since  . Codeine Nausea And Vomiting  . Levaquin [Levofloxacin] Nausea And Vomiting     Outpatient Medications Prior to Visit  Medication Sig Dispense Refill  . Albuterol Sulfate 108 (90 Base) MCG/ACT AEPB Inhale 2 puffs into the lungs every 6 (six) hours as needed (wheexing or shortness of breath).     [name]  aspirin EC 81 MG tablet Take 81 mg by mouth daily.    [name] atorvastatin (LIPITOR) 80 MG tablet Take 80 mg by mouth daily.    [name] buPROPion (WELLBUTRIN SR) 150 MG 12 hr tablet Take 150 mg by mouth daily. Verified SR    . celecoxib (CELEBREX) 200 MG capsule Take 200 mg by mouth daily as needed for mild pain.     . citalopram (CELEXA) 40 MG tablet Take 40 mg by mouth daily.    . famotidine (PEPCID) 40 MG tablet Take 40 mg by mouth daily.    . fluticasone (FLONASE) 50 MCG/ACT nasal spray Place 2 sprays into both nostrils daily. (Patient taking differently: Place 2 sprays into both nostrils at bedtime. ) 16 g 2  . gabapentin (NEURONTIN) 300 MG capsule daily.    [name] ipratropium-albuterol (DUONEB) 0.5-2.5 (3) MG/3ML SOLN Take 3 mLs by nebulization 2 (two) times daily as needed (wheezing or shortness of breath).     . isosorbide mononitrate (IMDUR) 120 MG 24 hr tablet Take 120 mg by mouth daily.    [name] levothyroxine (SYNTHROID, LEVOTHROID) 50 MCG tablet Take 50 mcg by mouth daily before breakfast.    . metoprolol (LOPRESSOR) 50 MG tablet Take 50 mg  by mouth daily. Verified tartrate    . omeprazole (PRILOSEC) 20 MG capsule Take 20 mg by mouth daily.    . potassium chloride SA (K-DUR,KLOR-CON) 20 MEQ tablet Take 40 mEq by mouth daily.    . QUEtiapine (SEROQUEL) 25 MG tablet Take 75 mg by mouth at bedtime. Takes 3 at bedtime    . torsemide (DEMADEX) 100 MG tablet Take 0.5 tablets (50 mg total) by mouth 2 (two) times daily. 90 tablet 3  . TURMERIC PO Take 50 mg by mouth 3 (three) times a week.     No facility-administered medications prior to visit.         Objective:   Physical Exam Vitals:   05/13/17 1057  BP: 126/76  Pulse: 71  SpO2: 95%  Weight: 216 lb (98 kg)  Height: 5\' 4"  (1.626 m)   Gen: Pleasant, obese, in no distress,  normal affect  ENT: No lesions,  mouth clear,  oropharynx clear, no postnasal drip  Neck: No JVD, no stridor, no hoarseness  Lungs: No use of accessory muscles, no wheeze  Cardiovascular: RRR, heart sounds normal, no murmur or gallops, no peripheral edema  Musculoskeletal: No deformities, no cyanosis or clubbing  Neuro: alert, non focal  Skin: Warm, no lesions or rashes      Assessment & Plan:  Sleep apnea Currently untreated.  She has not been able to tolerate multiple masks.  We discussed a home sleep study and retrying but she decided to defer.  I did explain to her that treatment of her sleep apnea may make her volume status and heart function easier to manage.  We will revisit depending on how she progresses  Allergic rhinitis Review her symptoms.  Continue loratadine once daily  Cough Significantly better.  Suspect that discontinuation of scheduled Ruthe Mannan has been helpful.  Most beneficial has been the addition of omeprazole.  We will continue this  Chronic GERD Continue omeprazole as ordered  Baltazar Apo, MD, PhD 05/13/2017, 11:16 AM [facility] [phone] or if no answer [phone]

## 2017-06-18 DIAGNOSIS — J019 Acute sinusitis, unspecified: Secondary | ICD-10-CM | POA: Diagnosis not present

## 2017-06-18 DIAGNOSIS — J441 Chronic obstructive pulmonary disease with (acute) exacerbation: Secondary | ICD-10-CM | POA: Diagnosis not present

## 2017-06-23 ENCOUNTER — Telehealth: Payer: Self-pay | Admitting: Cardiology

## 2017-06-23 DIAGNOSIS — I5032 Chronic diastolic (congestive) heart failure: Secondary | ICD-10-CM

## 2017-06-23 MED ORDER — TORSEMIDE 100 MG PO TABS: 50.0000 mg | ORAL_TABLET | Freq: Two times a day (BID) | ORAL | 3 refills | Status: DC

## 2017-06-23 NOTE — Telephone Encounter (Signed)
Refill sent.

## 2017-06-23 NOTE — Telephone Encounter (Signed)
Call Torsemide to [facility]

## 2017-07-07 ENCOUNTER — Other Ambulatory Visit: Payer: Self-pay

## 2017-07-07 DIAGNOSIS — I5032 Chronic diastolic (congestive) heart failure: Secondary | ICD-10-CM

## 2017-07-07 MED ORDER — TORSEMIDE 100 MG PO TABS: 50.0000 mg | ORAL_TABLET | Freq: Two times a day (BID) | ORAL | 3 refills | Status: DC

## 2017-07-22 DIAGNOSIS — R7989 Other specified abnormal findings of blood chemistry: Secondary | ICD-10-CM | POA: Diagnosis not present

## 2017-07-22 DIAGNOSIS — N289 Disorder of kidney and ureter, unspecified: Secondary | ICD-10-CM | POA: Diagnosis not present

## 2017-07-23 DIAGNOSIS — N959 Unspecified menopausal and perimenopausal disorder: Secondary | ICD-10-CM | POA: Diagnosis not present

## 2017-07-23 DIAGNOSIS — Z0000 Encounter for general adult medical examination without abnormal findings: Secondary | ICD-10-CM | POA: Diagnosis not present

## 2017-07-23 DIAGNOSIS — E785 Hyperlipidemia, unspecified: Secondary | ICD-10-CM | POA: Diagnosis not present

## 2017-07-23 DIAGNOSIS — Z1231 Encounter for screening mammogram for malignant neoplasm of breast: Secondary | ICD-10-CM | POA: Diagnosis not present

## 2017-07-23 DIAGNOSIS — Z136 Encounter for screening for cardiovascular disorders: Secondary | ICD-10-CM | POA: Diagnosis not present

## 2017-07-23 DIAGNOSIS — Z1331 Encounter for screening for depression: Secondary | ICD-10-CM | POA: Diagnosis not present

## 2017-07-23 DIAGNOSIS — Z9181 History of falling: Secondary | ICD-10-CM | POA: Diagnosis not present

## 2017-07-26 DIAGNOSIS — I10 Essential (primary) hypertension: Secondary | ICD-10-CM | POA: Diagnosis not present

## 2017-07-26 DIAGNOSIS — I2510 Atherosclerotic heart disease of native coronary artery without angina pectoris: Secondary | ICD-10-CM | POA: Diagnosis not present

## 2017-07-26 DIAGNOSIS — E039 Hypothyroidism, unspecified: Secondary | ICD-10-CM | POA: Diagnosis not present

## 2017-07-26 DIAGNOSIS — I5032 Chronic diastolic (congestive) heart failure: Secondary | ICD-10-CM | POA: Diagnosis not present

## 2017-07-26 DIAGNOSIS — D638 Anemia in other chronic diseases classified elsewhere: Secondary | ICD-10-CM | POA: Diagnosis not present

## 2017-07-26 DIAGNOSIS — N183 Chronic kidney disease, stage 3 (moderate): Secondary | ICD-10-CM | POA: Diagnosis not present

## 2017-07-26 DIAGNOSIS — E785 Hyperlipidemia, unspecified: Secondary | ICD-10-CM | POA: Diagnosis not present

## 2017-07-30 DIAGNOSIS — N183 Chronic kidney disease, stage 3 (moderate): Secondary | ICD-10-CM | POA: Diagnosis not present

## 2017-08-14 DIAGNOSIS — N184 Chronic kidney disease, stage 4 (severe): Secondary | ICD-10-CM | POA: Diagnosis not present

## 2017-08-14 DIAGNOSIS — R809 Proteinuria, unspecified: Secondary | ICD-10-CM | POA: Diagnosis not present

## 2017-08-14 DIAGNOSIS — N390 Urinary tract infection, site not specified: Secondary | ICD-10-CM | POA: Diagnosis not present

## 2017-08-14 DIAGNOSIS — N183 Chronic kidney disease, stage 3 (moderate): Secondary | ICD-10-CM | POA: Diagnosis not present

## 2017-08-14 DIAGNOSIS — I10 Essential (primary) hypertension: Secondary | ICD-10-CM | POA: Diagnosis not present

## 2017-08-29 DIAGNOSIS — J019 Acute sinusitis, unspecified: Secondary | ICD-10-CM | POA: Diagnosis not present

## 2017-08-29 DIAGNOSIS — R112 Nausea with vomiting, unspecified: Secondary | ICD-10-CM | POA: Diagnosis not present

## 2017-09-02 DIAGNOSIS — J208 Acute bronchitis due to other specified organisms: Secondary | ICD-10-CM | POA: Diagnosis not present

## 2017-09-09 DIAGNOSIS — N183 Chronic kidney disease, stage 3 (moderate): Secondary | ICD-10-CM | POA: Diagnosis not present

## 2017-09-09 DIAGNOSIS — I10 Essential (primary) hypertension: Secondary | ICD-10-CM | POA: Diagnosis not present

## 2017-09-11 DIAGNOSIS — I10 Essential (primary) hypertension: Secondary | ICD-10-CM | POA: Diagnosis not present

## 2017-09-11 DIAGNOSIS — N183 Chronic kidney disease, stage 3 (moderate): Secondary | ICD-10-CM | POA: Diagnosis not present

## 2017-09-11 DIAGNOSIS — N390 Urinary tract infection, site not specified: Secondary | ICD-10-CM | POA: Diagnosis not present

## 2017-10-21 DIAGNOSIS — I10 Essential (primary) hypertension: Secondary | ICD-10-CM | POA: Diagnosis not present

## 2017-10-21 DIAGNOSIS — N183 Chronic kidney disease, stage 3 (moderate): Secondary | ICD-10-CM | POA: Diagnosis not present

## 2017-10-23 DIAGNOSIS — I10 Essential (primary) hypertension: Secondary | ICD-10-CM | POA: Diagnosis not present

## 2017-10-23 DIAGNOSIS — N183 Chronic kidney disease, stage 3 (moderate): Secondary | ICD-10-CM | POA: Diagnosis not present

## 2017-10-23 DIAGNOSIS — N390 Urinary tract infection, site not specified: Secondary | ICD-10-CM | POA: Diagnosis not present

## 2017-10-30 DIAGNOSIS — J309 Allergic rhinitis, unspecified: Secondary | ICD-10-CM | POA: Diagnosis not present

## 2017-11-04 DIAGNOSIS — I10 Essential (primary) hypertension: Secondary | ICD-10-CM | POA: Diagnosis not present

## 2017-11-04 DIAGNOSIS — M4317 Spondylolisthesis, lumbosacral region: Secondary | ICD-10-CM | POA: Diagnosis not present

## 2017-11-17 DIAGNOSIS — J208 Acute bronchitis due to other specified organisms: Secondary | ICD-10-CM | POA: Diagnosis not present

## 2017-12-19 DIAGNOSIS — J208 Acute bronchitis due to other specified organisms: Secondary | ICD-10-CM | POA: Diagnosis not present

## 2018-01-13 ENCOUNTER — Encounter: Payer: Self-pay | Admitting: Cardiology

## 2018-01-13 ENCOUNTER — Ambulatory Visit: Payer: Medicare Other | Admitting: Cardiology

## 2018-01-13 VITALS — BP 124/84 | HR 65 | Wt 211.0 lb

## 2018-01-13 DIAGNOSIS — I110 Hypertensive heart disease with heart failure: Secondary | ICD-10-CM | POA: Diagnosis not present

## 2018-01-13 DIAGNOSIS — I5032 Chronic diastolic (congestive) heart failure: Secondary | ICD-10-CM | POA: Diagnosis not present

## 2018-01-13 DIAGNOSIS — I25119 Atherosclerotic heart disease of native coronary artery with unspecified angina pectoris: Secondary | ICD-10-CM | POA: Diagnosis not present

## 2018-01-13 DIAGNOSIS — E785 Hyperlipidemia, unspecified: Secondary | ICD-10-CM | POA: Diagnosis not present

## 2018-01-13 NOTE — Progress Notes (Signed)
Cardiology Office Note:    Date:  01/13/2018   ID:  Jody Taylor, DOB Oct 31, 1940, MRN [id number]  PCP:  Nicoletta Dress, MD  Cardiologist:  Shirlee More, MD    Referring MD: Nicoletta Dress, MD    ASSESSMENT:    1. Coronary artery disease involving native coronary artery of native heart with angina pectoris ([place])   2. Chronic diastolic CHF (congestive heart failure) ([place])   3. Hypertensive heart disease with heart failure ([place])   4. Hyperlipidemia LDL goal <70    PLAN:    In order of problems listed above:  1. Stable continue medical treatment including aspirin beta-blocker high intensity statin 2. She is nicely compensated she has no fluid overload New York Heart Association class I-II continue her current loop diuretic sodium restriction at home self-management 3. Blood pressure stable continue current treatment she is not on ACE with previous renal insufficiency 4. Stable continue high intensity statin await labs for liver safety LDL efficacy with statin therapy   Next appointment: 6 months   Medication Adjustments/Labs and Tests Ordered: Current medicines are reviewed at length with the patient today.  Concerns regarding medicines are outlined above.  No orders of the defined types were placed in this encounter.  No orders of the defined types were placed in this encounter.   Chief Complaint  Patient presents with  . Follow-up    8 month follow up visit.     History of Present Illness:    Jody Taylor is a 77 y.o. female with a hx of CAD, diastolic CHF, Dyslipidemia, HTN and sleep apnea unfortunately  last seen 05/12/17. Compliance with diet, lifestyle and medications: yes  Overall she is done well she has not tolerated CPAP device but is willing to try again.  Her weight is stable no edema shortness of breath chest pain palpitation or syncope recent labs requested from her PCP and due to have repeat labs in September. Past Medical History:  Diagnosis  Date  . 2-vessel coronary artery disease   . Adult hypothyroidism   . Allergic rhinitis 09/12/2016  . Anemia of chronic disease   . Arthritis   . CAD in native artery 05/02/2015   Overview:   S/P PCI and stent x 2. Last cardiac cath August 2010 with mild nonobstructive CAD and normal LV function PCI and stent of proximal [facility] 1999 Cath Nov 2016:Angiographic findings Cardiac Arteries and Lesion Findings LMCA: Normal. LAD: Normal. LCx: Normal. RCA: Abnormal. Lesion on R PDA: Ostial.35% stenosis 5 mm length . Pre procedure TIMI III flow was noted. Good run off was present.Bifurcation lesion.  Cath 05/08/15:Mild non-obstructive coronary artery disease. Normal LV function  . Cancer ([place]) 03/31/2017  . Chronic diastolic CHF (congestive heart failure) ([place])   . Chronic GERD   . Chronic kidney disease, stage 3 (moderate) ([place])    patient denies (listed in 07/20/16 PCP notes-Dr. Nelda Bucks)  . CKD (chronic kidney disease) 06/05/2015  . Colon polyp   . Cough 09/12/2016  . Depression    since hysterectomy   . Dyspnea 09/12/2016  . Hallucinations 08/17/2013  . Hyperlipidemia LDL goal <70   . Hypertension, essential, benign   . Obstructive sleep apnea 09/12/2016  . PONV (postoperative nausea and vomiting)   . PONV (postoperative nausea and vomiting)   . 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 Sleep apnea    wears CPAP  . Spondylolisthesis of lumbosacral region 11/07/2016  . Stroke [facility])    "i had  a mini stroke I didn't even know I had it" found on MRI  . Vaginal atrophy 01/30/2016  . Vitamin D deficiency   . Wheezing 09/12/2016    Past Surgical History:  Procedure Laterality Date  . APPENDECTOMY    . BACK SURGERY  11/07/2016   Lumbar region/ 4 screws and 2 rods  . CAROTID STENT    . CHOLECYSTECTOMY    . CORONARY ANGIOPLASTY WITH STENT PLACEMENT    . CORONARY BALLOON ANGIOPLASTY    . TOTAL ABDOMINAL HYSTERECTOMY      Current Medications: Current Meds  Medication Sig  . Albuterol  Sulfate 108 (90 Base) MCG/ACT AEPB Inhale 2 puffs into the lungs every 6 (six) hours as needed (wheexing or shortness of breath).   [name] aspirin EC 81 MG tablet Take 81 mg by mouth daily.  [name] atorvastatin (LIPITOR) 80 MG tablet Take 80 mg by mouth daily.  [name] buPROPion (WELLBUTRIN SR) 150 MG 12 hr tablet Take 150 mg by mouth daily. Verified SR  . citalopram (CELEXA) 40 MG tablet Take 40 mg by mouth daily.  . famotidine (PEPCID) 40 MG tablet Take 40 mg by mouth daily.  . fluticasone (FLONASE) 50 MCG/ACT nasal spray Place 2 sprays into both nostrils daily. (Patient taking differently: Place 2 sprays into both nostrils at bedtime. )  . ipratropium-albuterol (DUONEB) 0.5-2.5 (3) MG/3ML SOLN Take 3 mLs by nebulization 2 (two) times daily as needed (wheezing or shortness of breath).   . isosorbide mononitrate (IMDUR) 120 MG 24 hr tablet Take 120 mg by mouth daily.  [name] levothyroxine (SYNTHROID, LEVOTHROID) 50 MCG tablet Take 50 mcg by mouth daily before breakfast.  . metoprolol (LOPRESSOR) 50 MG tablet Take 50 mg by mouth daily. Verified tartrate  . pantoprazole (PROTONIX) 20 MG tablet Take 20 mg by mouth daily.  . potassium chloride SA (K-DUR,KLOR-CON) 20 MEQ tablet Take 40 mEq by mouth daily.  . QUEtiapine (SEROQUEL) 25 MG tablet Take 75 mg by mouth at bedtime. Takes 3 at bedtime  . torsemide (DEMADEX) 20 MG tablet Take 20 mg by mouth daily.     Allergies:   Ace inhibitors; Other; Codeine; and Levaquin [levofloxacin]   Social History   Socioeconomic History  . Marital status: Single    Spouse name: Not on file  . Number of children: Not on file  . Years of education: Not on file  . Highest education level: Not on file  Occupational History  . Not on file  Social Needs  . Financial resource strain: Not on file  . Food insecurity:    Worry: Not on file    Inability: Not on file  . Transportation needs:    Medical: Not on file    Non-medical: Not on file  Tobacco Use  . Smoking status: Former  Smoker    Last attempt to quit: 02/14/1965    Years since quitting: 52.9  . Smokeless tobacco: Never Used  Substance and Sexual Activity  . Alcohol use: No  . Drug use: No  . Sexual activity: Not on file  Lifestyle  . Physical activity:    Days per week: Not on file    Minutes per session: Not on file  . Stress: Not on file  Relationships  . Social connections:    Talks on phone: Not on file    Gets together: Not on file    Attends religious service: Not on file    Active member of club or organization: Not on file  Attends meetings of clubs or organizations: Not on file    Relationship status: Not on file  Other Topics Concern  . Not on file  Social History Narrative  . Not on file     Family History: The patient's family history includes Diabetes in her mother; Heart disease in her father; Stroke in her father and mother. ROS:   Please see the history of present illness.    All other systems reviewed and are negative.  EKGs/Labs/Other Studies Reviewed:    The following studies were reviewed today:   Recent Labs: 05/12/2017: BNP 146.9; BUN 22; Creatinine, Ser 1.91; Potassium 4.6; Sodium 142  Recent Lipid Panel No results found for: CHOL, TRIG, HDL, CHOLHDL, VLDL, LDLCALC, LDLDIRECT  Physical Exam:    VS:  BP 124/84 (BP Location: Right Wrist, Patient Position: Sitting, Cuff Size: Small)   Pulse 65   Wt 211 lb (95.7 kg)   LMP  (LMP Unknown)   SpO2 95%   BMI 36.22 kg/m     Wt Readings from Last 3 Encounters:  01/13/18 211 lb (95.7 kg)  05/13/17 216 lb (98 kg)  05/12/17 216 lb (98 kg)     GEN:  Well nourished, well developed in no acute distress HEENT: Normal NECK: No JVD; No carotid bruits LYMPHATICS: No lymphadenopathy CARDIAC: RRR, no murmurs, rubs, gallops RESPIRATORY:  Clear to auscultation without rales, wheezing or rhonchi  ABDOMEN: Soft, non-tender, non-distended MUSCULOSKELETAL:  No edema; No deformity  SKIN: Warm and dry NEUROLOGIC:  Alert  and oriented x 3 PSYCHIATRIC:  Normal affect    Signed, Shirlee More, MD  01/13/2018 2:18 PM    [facility]

## 2018-01-13 NOTE — Patient Instructions (Addendum)
Medication Instructions:  Your physician recommends that you continue on your current medications as directed. Please refer to the Current Medication list given to you today.   Labwork: None  Testing/Procedures: None  Follow-Up: Your physician wants you to follow-up in: 6 months. You will receive a reminder letter in the mail two months in advance. If you don't receive a letter, please call our office to schedule the follow-up appointment.   Any Other Special Instructions Will Be Listed Below (If Applicable).     If you need a refill on your cardiac medications before your next appointment, please call your pharmacy.    Heart Failure  Weigh yourself every morning when you first wake up and record on a calender or note pad, bring this to your office visits. Using a pill tender can help with taking your medications consistently.  Limit your fluid intake to 2 liters daily  Limit your sodium intake to less than 2-3 grams daily. Ask if you need dietary teaching.  If you gain more than 3 pounds (from your dry weight ), double your dose of diuretic for the day.  If you gain more than 5 pounds (from your dry weight), double your dose of lasix and call your heart failure doctor.  Please do not smoke tobacco since it is very bad for your heart.  Please do not drink alcohol since it can worsen your heart failure.Also avoid OTC nonsteroidal drugs, such as advil, aleve and motrin.  Try to exercise for at least 30 minutes every day because this will help your heart be more efficient. You may be eligible for supervised cardiac rehab, ask your physician.     

## 2018-01-19 DIAGNOSIS — J208 Acute bronchitis due to other specified organisms: Secondary | ICD-10-CM | POA: Diagnosis not present

## 2018-01-22 DIAGNOSIS — J208 Acute bronchitis due to other specified organisms: Secondary | ICD-10-CM | POA: Diagnosis not present

## 2018-01-22 DIAGNOSIS — R0602 Shortness of breath: Secondary | ICD-10-CM | POA: Diagnosis not present

## 2018-01-22 DIAGNOSIS — R05 Cough: Secondary | ICD-10-CM | POA: Diagnosis not present

## 2018-01-31 DIAGNOSIS — I10 Essential (primary) hypertension: Secondary | ICD-10-CM | POA: Diagnosis not present

## 2018-01-31 DIAGNOSIS — D638 Anemia in other chronic diseases classified elsewhere: Secondary | ICD-10-CM | POA: Diagnosis not present

## 2018-01-31 DIAGNOSIS — E785 Hyperlipidemia, unspecified: Secondary | ICD-10-CM | POA: Diagnosis not present

## 2018-01-31 DIAGNOSIS — E538 Deficiency of other specified B group vitamins: Secondary | ICD-10-CM | POA: Diagnosis not present

## 2018-01-31 DIAGNOSIS — I5032 Chronic diastolic (congestive) heart failure: Secondary | ICD-10-CM | POA: Diagnosis not present

## 2018-01-31 DIAGNOSIS — N183 Chronic kidney disease, stage 3 (moderate): Secondary | ICD-10-CM | POA: Diagnosis not present

## 2018-01-31 DIAGNOSIS — I2510 Atherosclerotic heart disease of native coronary artery without angina pectoris: Secondary | ICD-10-CM | POA: Diagnosis not present

## 2018-01-31 DIAGNOSIS — E039 Hypothyroidism, unspecified: Secondary | ICD-10-CM | POA: Diagnosis not present

## 2018-02-18 DIAGNOSIS — J45909 Unspecified asthma, uncomplicated: Secondary | ICD-10-CM | POA: Diagnosis not present

## 2018-02-18 DIAGNOSIS — J208 Acute bronchitis due to other specified organisms: Secondary | ICD-10-CM | POA: Diagnosis not present

## 2018-02-23 DIAGNOSIS — E039 Hypothyroidism, unspecified: Secondary | ICD-10-CM | POA: Diagnosis not present

## 2018-02-27 DIAGNOSIS — M8589 Other specified disorders of bone density and structure, multiple sites: Secondary | ICD-10-CM | POA: Diagnosis not present

## 2018-02-27 DIAGNOSIS — Z1231 Encounter for screening mammogram for malignant neoplasm of breast: Secondary | ICD-10-CM | POA: Diagnosis not present

## 2018-02-27 DIAGNOSIS — M810 Age-related osteoporosis without current pathological fracture: Secondary | ICD-10-CM | POA: Diagnosis not present

## 2018-03-02 ENCOUNTER — Ambulatory Visit: Payer: Medicare Other | Admitting: Allergy and Immunology

## 2018-03-02 ENCOUNTER — Encounter: Payer: Self-pay | Admitting: Allergy and Immunology

## 2018-03-02 VITALS — BP 132/92 | HR 72 | Temp 98.3°F | Resp 24 | Ht 62.0 in | Wt 210.6 lb

## 2018-03-02 DIAGNOSIS — J4520 Mild intermittent asthma, uncomplicated: Secondary | ICD-10-CM | POA: Diagnosis not present

## 2018-03-02 DIAGNOSIS — K219 Gastro-esophageal reflux disease without esophagitis: Secondary | ICD-10-CM

## 2018-03-02 DIAGNOSIS — J4550 Severe persistent asthma, uncomplicated: Secondary | ICD-10-CM | POA: Diagnosis not present

## 2018-03-02 MED ORDER — MONTELUKAST SODIUM 10 MG PO TABS: 10.0000 mg | ORAL_TABLET | Freq: Every day | ORAL | 1 refills | Status: DC

## 2018-03-02 MED ORDER — RANITIDINE HCL 300 MG PO TABS: 300.0000 mg | ORAL_TABLET | Freq: Every day | ORAL | 1 refills | Status: DC

## 2018-03-02 MED ORDER — MOMETASONE FUROATE 200 MCG/ACT IN AERO: 2.0000 | INHALATION_SPRAY | Freq: Two times a day (BID) | RESPIRATORY_TRACT | 1 refills | Status: DC

## 2018-03-02 NOTE — Progress Notes (Signed)
Dear Dr. Delena Bali,  Thank you for referring Jody Taylor to the [facility] on 03/02/2018.   Below is a summation of this patient's evaluation and recommendations.  Thank you for your referral. I will keep you informed about this patient's response to treatment.   If you have any questions please do not hesitate to contact me.   Sincerely,  Jiles Prows, MD Allergy / Immunology [place]   ______________________________________________________________________    NEW PATIENT NOTE  Referring Provider: Nicoletta Dress, MD Primary Provider: Nicoletta Dress, MD Date of office visit: 03/02/2018    Subjective:   Chief Complaint:  Jody Taylor (DOB: 24-Mar-1941) is a 77 y.o. female who presents to the clinic on 03/02/2018 with a chief complaint of Bronchitis .     HPI: Jody Taylor presents to this clinic in evaluation of "bronchitis".  Apparently over the course of the past 4 years she has been having recurrent issues with coughing and wheezing and now she has developed these unrelenting coughing episodes associated with micturition and vomiting.  She thinks that she has wheezing associated with these episodes as well.  She has had 3 of these episodes over the course of the past 3 months each requiring an antibiotic and prednisone and Tussionex.  When she has these episodes she really cannot sleep at all because of unrelenting coughing.  Her last chest x-ray was approximately 1 month ago.  She does relate a history of being short of breath and having chest tightness when she walks.  She does not really exercise to any degree.  She was given a short acting bronchodilator which she has used which has not really given rise to any improvement regarding her issue.  Apparently she was given Symbicort according to her medical record but she can not remember taking this medication.  She does have  constant throat clearing which appears to be an issue for her husband noting how often she does clear her throat.  She does have reflux and she has had her reflux medications manipulated because of renal disease.  She had her omeprazole eliminated and she is now using Protonix 40 mg daily.  She drinks 4 diet Cokes per day and has tea on Sunday and eats chocolate on a daily basis.  She does not have any other atopic history.  She occasionally gets some nasal congestion but this is not a particularly big issue.  She does not have any anosmia or ugly nasal discharge or recurrent episodes of sinusitis.  She does have a history of untreated sleep apnea.  Past Medical History:  Diagnosis Date  . 2-vessel coronary artery disease   . Adult hypothyroidism   . Allergic rhinitis 09/12/2016  . Anemia of chronic disease   . Arthritis   . CAD in native artery 05/02/2015   Overview:   S/P PCI and stent x 2. Last cardiac cath August 2010 with mild nonobstructive CAD and normal LV function PCI and stent of proximal [facility] 1999 Cath Nov 2016:Angiographic findings Cardiac Arteries and Lesion Findings LMCA: Normal. LAD: Normal. LCx: Normal. RCA: Abnormal. Lesion on R PDA: Ostial.35% stenosis 5 mm length . Pre procedure TIMI III flow was noted. Good run off was present.Bifurcation lesion.  Cath 05/08/15:Mild non-obstructive coronary artery disease. Normal LV function  . Cancer ([place]) 03/31/2017  . Chronic diastolic CHF (congestive heart failure) ([place])   . Chronic GERD   .  Chronic kidney disease, stage 3 (moderate) ([place])    patient denies (listed in 07/20/16 PCP notes-Dr. Nelda Bucks)  . CKD (chronic kidney disease) 06/05/2015  . Colon polyp   . Cough 09/12/2016  . Depression    since hysterectomy   . Dyspnea 09/12/2016  . Hallucinations 08/17/2013  . Heart attack ([place])   . Hyperlipidemia LDL goal <70   . Hypertension, essential, benign   . Obstructive sleep apnea 09/12/2016  . Polio   . PONV (postoperative nausea  and vomiting)   . PONV (postoperative nausea and vomiting)   . 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 Sleep apnea    wears CPAP  . Spondylolisthesis of lumbosacral region 11/07/2016  . Stroke [facility])    "i had a mini stroke I didn't even know I had it" found on MRI  . Vaginal atrophy 01/30/2016  . Vitamin D deficiency   . Wheezing 09/12/2016    Past Surgical History:  Procedure Laterality Date  . APPENDECTOMY    . BACK SURGERY  11/07/2016   Lumbar region/ 4 screws and 2 rods  . CAROTID STENT    . CHOLECYSTECTOMY    . CORONARY ANGIOPLASTY WITH STENT PLACEMENT    . CORONARY BALLOON ANGIOPLASTY    . TOTAL ABDOMINAL HYSTERECTOMY      Allergies as of 03/02/2018      Reactions   Ace Inhibitors Other (See Comments), Cough   CHEST PAIN   Other    Patient reports receiving blood after a miscarriage "years ago". She states she broke out from receiving this blood. Has not received any since   Codeine Nausea And Vomiting   Levaquin [levofloxacin] Nausea And Vomiting      Medication List      aspirin EC 81 MG tablet Take 81 mg by mouth daily.   Azelastine HCl 0.15 % Soln Place into the nose 2 (two) times daily as needed.   buPROPion 150 MG 12 hr tablet Commonly known as:  WELLBUTRIN SR Take 150 mg by mouth 2 (two) times daily. Verified SR   chlorpheniramine-HYDROcodone 10-8 MG/5ML Suer Commonly known as:  TUSSIONEX 5 mL every twelve (12) hours as needed.   citalopram 40 MG tablet Commonly known as:  CELEXA Take 40 mg by mouth daily.   FISH OIL PO Take by mouth daily.   isosorbide mononitrate 120 MG 24 hr tablet Commonly known as:  IMDUR Take 120 mg by mouth daily.   levothyroxine 50 MCG tablet Commonly known as:  SYNTHROID, LEVOTHROID Take 50 mcg by mouth daily before breakfast.   metoprolol tartrate 50 MG tablet Commonly known as:  LOPRESSOR Take 50 mg by mouth 2 (two) times daily. Verified tartrate   pantoprazole 40 MG tablet Commonly known as:   PROTONIX Take 40 mg by mouth daily.   potassium chloride SA 20 MEQ tablet Commonly known as:  K-DUR,KLOR-CON Take 40 mEq by mouth daily.   PROAIR HFA 108 (90 Base) MCG/ACT inhaler Generic drug:  albuterol Inhale 2 puffs into the lungs every 6 (six) hours as needed for wheezing or shortness of breath.   QUEtiapine 25 MG tablet Commonly known as:  SEROQUEL Take 75 mg by mouth at bedtime. Takes 3 at bedtime   torsemide 20 MG tablet Commonly known as:  DEMADEX Take 20 mg by mouth daily.       Review of systems negative except as noted in HPI / PMHx or noted below:  Review of Systems  Constitutional: Negative.   HENT: Negative.  Eyes: Negative.   Respiratory: Negative.   Cardiovascular: Negative.   Gastrointestinal: Negative.   Genitourinary: Negative.   Musculoskeletal: Negative.   Skin: Negative.   Neurological: Negative.   Endo/Heme/Allergies: Negative.   Psychiatric/Behavioral: Negative.     Family History  Problem Relation Age of Onset  . Diabetes Mother   . Stroke Mother   . Asthma Mother   . Heart disease Father   . Stroke Father   . Colon cancer Brother     Social History   Socioeconomic History  . Marital status: Married    Spouse name: Not on file  . Number of children: Not on file  . Years of education: Not on file  . Highest education level: Not on file  Occupational History  . Not on file  Social Needs  . Financial resource strain: Not on file  . Food insecurity:    Worry: Not on file    Inability: Not on file  . Transportation needs:    Medical: Not on file    Non-medical: Not on file  Tobacco Use  . Smoking status: Former Smoker    Last attempt to quit: 02/14/1965    Years since quitting: 53.0  . Smokeless tobacco: Never Used  . Tobacco comment: for about one month  Substance and Sexual Activity  . Alcohol use: No  . Drug use: No  . Sexual activity: Not on file  Lifestyle  . Physical activity:    Days per week: Not on file     Minutes per session: Not on file  . Stress: Not on file  Relationships  . Social connections:    Talks on phone: Not on file    Gets together: Not on file    Attends religious service: Not on file    Active member of club or organization: Not on file    Attends meetings of clubs or organizations: Not on file    Relationship status: Not on file  . Intimate partner violence:    Fear of current or ex partner: Not on file    Emotionally abused: Not on file    Physically abused: Not on file    Forced sexual activity: Not on file  Other Topics Concern  . Not on file  Social History Narrative  . Not on file    Environmental and Social history  Lives in a house with a dry environment, no animals located inside the household, carpet in the bedroom, plastic on the bed, plastic on the pillow, no smokers located inside the household.  Objective:   Vitals:   03/02/18 1435  BP: (!) 132/92  Pulse: 72  Resp: (!) 24  Temp: 98.3 F (36.8 C)   Height: 5\' 2"  (157.5 cm) Weight: 210 lb 9.6 oz (95.5 kg)  Physical Exam  Constitutional:  Throat clearing, slightly raspy voice  HENT:  Head: Normocephalic. Head is without right periorbital erythema and without left periorbital erythema.  Right Ear: Tympanic membrane, external ear and ear canal normal.  Left Ear: Tympanic membrane, external ear and ear canal normal.  Nose: Nose normal. No mucosal edema or rhinorrhea.  Mouth/Throat: Oropharynx is clear and moist and mucous membranes are normal. No oropharyngeal exudate.  Eyes: Pupils are equal, round, and reactive to light. Conjunctivae and lids are normal.  Neck: Trachea normal. No tracheal deviation present. No thyromegaly present.  Cardiovascular: Normal rate, regular rhythm, S1 normal, S2 normal and normal heart sounds.  No murmur heard. Pulmonary/Chest: Effort normal. No stridor.  No respiratory distress. She has no wheezes. She has no rales. She exhibits no tenderness.  Abdominal: Soft.  She exhibits no distension and no mass. There is no hepatosplenomegaly. There is no tenderness. There is no rebound and no guarding.  Musculoskeletal: She exhibits no edema or tenderness.  Lymphadenopathy:       Head (right side): No tonsillar adenopathy present.       Head (left side): No tonsillar adenopathy present.    She has no cervical adenopathy.    She has no axillary adenopathy.  Neurological: She is alert.  Skin: No rash noted. She is not diaphoretic. No erythema. No pallor. Nails show no clubbing.    Diagnostics: Allergy skin tests were performed.  She did not demonstrate any hypersensitivity against a screening panel of aeroallergens or foods.  Spirometry was performed and demonstrated an FEV1 of 1.48 @ 79 % of predicted. FEV1/FVC = 0.86.  Following the administration of nebulized albuterol her FEV1 rose to 1.71 which was an increase in the FEV1 of 16%.  Review of chest x-ray dated 22 January 2018 identifies interstitial markings course throughout although stable.  Calcification in the wall of the aortic arch.  Otherwise, no significant abnormality.   Assessment and Plan:    1. Not well controlled severe persistent asthma   2. LPRD (laryngopharyngeal reflux disease)     1.  Allergen avoidance measures?  2.  Treat and prevent inflammation:   A. Asmanex 200 HFA - Two inhalations two times per day with spacer  B. Montelukast 10 mg - One tablet one time per day  3.  Treat and prevent reflux:   A.  Continue Protonix 40 mg tablet in a.m.  B.  Start ranitidine 300 mg tablet in p.m.  C.  Slowly taper off all forms of caffeine including chocolate  D.  Discontinue fish oil supplementation  4.  If needed:   A.  Nasal saline spray  B.  Pro-air HFA 2 puffs every 4-6 hours  5.  Return to clinic in 3 weeks or earlier if problem.  Seanne certainly has significant inflammation and irritation of her airway and I have started her on preventative anti-inflammatory medications  including the use of an inhaled steroid and a leukotriene modifier to address this issue.  In addition, I suspect that reflux is 1 of the triggers giving rise to this respiratory tract problem and we will increase her therapy for reflux as noted above which will include tapering off her rather extensive caffeine and chocolate use.  I will see her back in this clinic in 3 weeks or earlier if there is a problem.  Jiles Prows, MD Allergy / Immunology [facility]

## 2018-03-02 NOTE — Patient Instructions (Addendum)
  1.  Allergen avoidance measures?  2.  Treat and prevent inflammation:   A. Asmanex 200 HFA - Two inhalations two times per day with spacer  B. Montelukast 10 mg - One tablet one time per day  3.  Treat and prevent reflux:   A.  Continue Protonix 40 mg tablet in a.m.  B.  Start ranitidine 300 mg tablet in p.m.  C.  Slowly taper off all forms of caffeine including chocolate  D.  Discontinue fish oil supplementation  4.  If needed:   A.  Nasal saline spray  B.  Pro-air HFA 2 puffs every 4-6 hours  5.  Return to clinic in 3 weeks or earlier if problem.

## 2018-03-03 ENCOUNTER — Encounter: Payer: Self-pay | Admitting: Allergy and Immunology

## 2018-03-06 ENCOUNTER — Encounter: Payer: Self-pay | Admitting: Allergy and Immunology

## 2018-03-16 ENCOUNTER — Telehealth: Payer: Self-pay | Admitting: Allergy and Immunology

## 2018-03-16 MED ORDER — FAMOTIDINE 40 MG PO TABS: 40.0000 mg | ORAL_TABLET | Freq: Every day | ORAL | 4 refills | Status: DC

## 2018-03-16 NOTE — Telephone Encounter (Signed)
Patient advised. Rx sent. 

## 2018-03-16 NOTE — Telephone Encounter (Signed)
Prakriti called in and states she heard on the news that Zantac causes cancer and is scared to keep taking them.  Marilea would like to ask Dr. Neldon Mc if she should continue to take Zantac despite what is said on TV.

## 2018-03-16 NOTE — Telephone Encounter (Signed)
Can replace ranitidine with famotidine 40 mg

## 2018-03-24 ENCOUNTER — Ambulatory Visit: Payer: Medicare Other | Admitting: Allergy

## 2018-03-24 ENCOUNTER — Encounter: Payer: Self-pay | Admitting: Allergy

## 2018-03-24 VITALS — BP 110/70 | HR 74 | Resp 22

## 2018-03-24 DIAGNOSIS — J310 Chronic rhinitis: Secondary | ICD-10-CM | POA: Diagnosis not present

## 2018-03-24 DIAGNOSIS — K219 Gastro-esophageal reflux disease without esophagitis: Secondary | ICD-10-CM

## 2018-03-24 DIAGNOSIS — J4550 Severe persistent asthma, uncomplicated: Secondary | ICD-10-CM | POA: Diagnosis not present

## 2018-03-24 MED ORDER — DULERA 100-5 MCG/ACT IN AERO: INHALATION_SPRAY | RESPIRATORY_TRACT | 5 refills | Status: DC

## 2018-03-24 MED ORDER — AZELASTINE HCL 0.1 % NA SOLN: NASAL | 5 refills | Status: DC

## 2018-03-24 MED ORDER — IPRATROPIUM-ALBUTEROL 0.5-2.5 (3) MG/3ML IN SOLN
3.0000 mL | Freq: Once | RESPIRATORY_TRACT | Status: AC
  Administered 2018-03-24: 3 mL via RESPIRATORY_TRACT

## 2018-03-24 NOTE — Progress Notes (Signed)
Follow-up Note  RE: SUZANNAH BETTES MRN: [id number] DOB: Jun 07, 1941 Date of Office Visit: 03/24/2018   History of present illness: Jody Taylor is a 77 y.o. female presenting today for follow-up of not well controlled asthma and LPRD.  She last seen in the office on 03/02/18 for initial visit with Dr. Neldon Mc.  At this visit she was provided with Asmanex sample that sh ehas been using 2 puffs twice a day but does not feel it has provided with much relief. She is still needed to use albuterol 1-2 times a day with relief of symptoms.  She also is on singulair and not sure if this has helped either.  She states she does have tussionex which does help at night with cough suppression if she does take a dose.  She coughs throughout the day but does feel it is worse at night and is having trouble initiating sleepy because of it.  She is also on protonix and famotidine and she does feel this combination is helping with her reflux control.  She also states she has nasal drainage and feels a tickle in her throat and throat clears during the day.  She does have azelastine nasal spray at home but states she rarely uses it.    Review of systems: Review of Systems  Constitutional: Positive for malaise/fatigue. Negative for chills and fever.  HENT: Positive for congestion. Negative for ear discharge, ear pain, nosebleeds, sinus pain and sore throat.   Eyes: Negative for pain, discharge and redness.  Respiratory: Positive for cough, sputum production, shortness of breath and wheezing. Negative for hemoptysis.   Cardiovascular: Negative for chest pain.  Gastrointestinal: Negative for abdominal pain, constipation, diarrhea, nausea and vomiting.  Musculoskeletal: Negative for joint pain.  Skin: Negative for itching and rash.  Neurological: Negative for headaches.    All other systems negative unless noted above in HPI  Past medical/social/surgical/family history have been reviewed and are unchanged unless  specifically indicated below.  No changes  Medication List: Allergies as of 03/24/2018      Reactions   Ace Inhibitors Other (See Comments), Cough   CHEST PAIN   Other    Patient reports receiving blood after a miscarriage "years ago". She states she broke out from receiving this blood. Has not received any since   Codeine Nausea And Vomiting   Levaquin [levofloxacin] Nausea And Vomiting      Medication List        Accurate as of 03/24/18  3:34 PM. Always use your most recent med list.          aspirin EC 81 MG tablet Take 81 mg by mouth daily.   buPROPion 150 MG 12 hr tablet Commonly known as:  WELLBUTRIN SR Take 150 mg by mouth 2 (two) times daily. Verified SR   citalopram 40 MG tablet Commonly known as:  CELEXA Take 40 mg by mouth daily.   famotidine 40 MG tablet Commonly known as:  PEPCID Take 1 tablet (40 mg total) by mouth at bedtime.   ipratropium-albuterol 0.5-2.5 (3) MG/3ML Soln Commonly known as:  DUONEB Take 3 mLs by nebulization every 6 (six) hours as needed.   isosorbide mononitrate 120 MG 24 hr tablet Commonly known as:  IMDUR Take 120 mg by mouth daily.   levothyroxine 75 MCG tablet Commonly known as:  SYNTHROID, LEVOTHROID   metoprolol tartrate 50 MG tablet Commonly known as:  LOPRESSOR Take 50 mg by mouth 2 (two) times daily. Verified tartrate  Mometasone Furoate 200 MCG/ACT Aero Inhale 2 puffs into the lungs 2 (two) times daily. Rinse, gargle, and spit after use.   montelukast 10 MG tablet Commonly known as:  SINGULAIR Take 1 tablet (10 mg total) by mouth at bedtime.   nitroGLYCERIN 0.4 MG SL tablet Commonly known as:  NITROSTAT   pantoprazole 40 MG tablet Commonly known as:  PROTONIX Take 40 mg by mouth daily.   potassium chloride SA 20 MEQ tablet Commonly known as:  K-DUR,KLOR-CON Take 40 mEq by mouth daily.   PROAIR HFA 108 (90 Base) MCG/ACT inhaler Generic drug:  albuterol Inhale 2 puffs into the lungs every 6 (six) hours  as needed for wheezing or shortness of breath.   QUEtiapine 25 MG tablet Commonly known as:  SEROQUEL Take 75 mg by mouth at bedtime. Takes 3 at bedtime   torsemide 20 MG tablet Commonly known as:  DEMADEX Take 20 mg by mouth daily.       Known medication allergies: Allergies  Allergen Reactions  . Ace Inhibitors Other (See Comments) and Cough    CHEST PAIN  . Other     Patient reports receiving blood after a miscarriage "years ago". She states she broke out from receiving this blood. Has not received any since  . Codeine Nausea And Vomiting  . Levaquin [Levofloxacin] Nausea And Vomiting     Physical examination: Blood pressure 110/70, pulse 74, resp. rate (!) 22, SpO2 96 %.  General: Alert, interactive, in no acute distress. HEENT: PERRLA, TMs pearly gray, turbinates mildly edematous with clear discharge, post-pharynx non erythematous. Neck: Supple without lymphadenopathy. Lungs: Mildly decreased breath sounds with expiratory wheezing bilaterally. {no increased work of breathing.  Improved aeration and decreased wheeze s/p duoneg CV: Normal S1, S2 without murmurs. Abdomen: Nondistended, nontender. Skin: Warm and dry, without lesions or rashes. Extremities:  No clubbing, cyanosis or edema. Neuro:   Grossly intact.  Diagnositics/Labs:  Spirometry: FEV1: 1.52L 81%, FVC: 1.81L 72%, ratio consistent with nonobstructive pattern for age  Assessment and plan:   Severe persistent asthma - still not well controlled.  It appears she may have some improvement with maximizing her reflux regimen.  Will step up to [facility] at this time LPRD - continue protonix and famotidine as providing relief Rhinitis - believe she has a significant amount of PND also exacerbating cough symptoms and have asked that she take nasal antihistamine regularly  1.  Treat and prevent inflammation:   A. Stop Asmanex and trial Dulera 142mcg 2 puffs twice a day.    B. Continue Montelukast 10 mg - One tablet  one time per day  C. Start daily use of Astelin 1-2 sprays each nostril twice a day for nasal drainage/post-nasal drip control  2.  Treat and prevent reflux:   A.  Continue Protonix 40 mg tablet in a.m.  B.  Continue famotidine 40 mg tablet in p.m.  C.  Slowly taper off all forms of caffeine including chocolate  D.  Stay off fish oil supplementation  3.  If needed:   A.  Nasal saline spray  B.  Pro-air HFA 2 puffs every 4-6 hours  4.  Return to clinic in 4-6 weeks or earlier if problem.  I appreciate the opportunity to take part in Hajar's care. Please do not hesitate to contact me with questions.  Sincerely,   Prudy Feeler, MD Allergy/Immunology [facility]

## 2018-03-24 NOTE — Patient Instructions (Addendum)
  1.  Treat and prevent inflammation:   A. Stop Asmanex and trial Dulera 124mcg 2 puffs twice a day.    B. Continue Montelukast 10 mg - One tablet one time per day  C. Start daily use of Astelin 1-2 sprays each nostril twice a day for nasal drainage/post-nasal drip control  2.  Treat and prevent reflux:   A.  Continue Protonix 40 mg tablet in a.m.  B.  Continue famotidine 40 mg tablet in p.m.  C.  Slowly taper off all forms of caffeine including chocolate  D.  Stay off fish oil supplementation  3.  If needed:   A.  Nasal saline spray  B.  Pro-air HFA 2 puffs every 4-6 hours  4.  Return to clinic in 4-6 weeks or earlier if problem.

## 2018-03-25 ENCOUNTER — Ambulatory Visit: Payer: Medicare Other | Admitting: Allergy and Immunology

## 2018-03-30 ENCOUNTER — Other Ambulatory Visit: Payer: Self-pay | Admitting: *Deleted

## 2018-03-30 ENCOUNTER — Telehealth: Payer: Self-pay

## 2018-03-30 DIAGNOSIS — B999 Unspecified infectious disease: Secondary | ICD-10-CM

## 2018-03-30 NOTE — Telephone Encounter (Signed)
Obtain sinus CT scan for chronic sinusitis.  Obtain blood tests - cbc w/diff, IgA/G/M, aero allergen profile 2, anti-pneumococcal antibodies, antitetanus antibody for recurrent infections.  Obtain ENT evaluation to examine throat for LPR.  Then return to see me in clinic.

## 2018-03-30 NOTE — Telephone Encounter (Signed)
Patient saw Dr. Nelva Bush last week.  She is still coughing and coughing up white phlegm, and has some wheezing also.  She has been using the Dulera 100- 2 puffs twice daily, her astelin twice daily and other medications as prescribed.  Please advise.

## 2018-03-30 NOTE — Telephone Encounter (Signed)
I informed Jody Taylor of what Dr. Neldon Mc wants to do. I will put lab orders in so she can go to [facility] tomorrow. We will work on the CT and ENT eval.

## 2018-03-31 DIAGNOSIS — I10 Essential (primary) hypertension: Secondary | ICD-10-CM | POA: Diagnosis not present

## 2018-03-31 DIAGNOSIS — N183 Chronic kidney disease, stage 3 (moderate): Secondary | ICD-10-CM | POA: Diagnosis not present

## 2018-03-31 NOTE — Telephone Encounter (Signed)
Left message for Jody Taylor to call back. Need to inform her of the following:   Will call once labs come back.  Sinus CT will be at [facility] on Friday Oct. 18th @ 11:15 arriving at 10:45 am for check-in.  ENT will be at [facility] on Nov. 5th @ 10:10, they will mail paperwork for her to fill out to bring to appt. Make sure she brings her insurance card, license, and med list to appt.

## 2018-03-31 NOTE — Telephone Encounter (Signed)
Patient advised of appts

## 2018-04-02 ENCOUNTER — Other Ambulatory Visit: Payer: Self-pay

## 2018-04-02 ENCOUNTER — Other Ambulatory Visit: Payer: Self-pay | Admitting: Allergy and Immunology

## 2018-04-03 DIAGNOSIS — J329 Chronic sinusitis, unspecified: Secondary | ICD-10-CM | POA: Diagnosis not present

## 2018-04-06 ENCOUNTER — Telehealth: Payer: Self-pay | Admitting: *Deleted

## 2018-04-06 NOTE — Telephone Encounter (Signed)
Patient advised per Dr Neldon Mc sinus CT scan results from [facility] did not show sinusitis so no sinusitis causing respiratory symptoms

## 2018-04-07 ENCOUNTER — Ambulatory Visit: Payer: Medicare Other | Admitting: Allergy

## 2018-04-07 ENCOUNTER — Encounter: Payer: Self-pay | Admitting: Allergy

## 2018-04-07 VITALS — BP 124/78 | HR 83 | Resp 18

## 2018-04-07 DIAGNOSIS — J4550 Severe persistent asthma, uncomplicated: Secondary | ICD-10-CM

## 2018-04-07 DIAGNOSIS — J040 Acute laryngitis: Secondary | ICD-10-CM

## 2018-04-07 DIAGNOSIS — K219 Gastro-esophageal reflux disease without esophagitis: Secondary | ICD-10-CM | POA: Diagnosis not present

## 2018-04-07 DIAGNOSIS — J988 Other specified respiratory disorders: Secondary | ICD-10-CM | POA: Diagnosis not present

## 2018-04-07 DIAGNOSIS — J310 Chronic rhinitis: Secondary | ICD-10-CM | POA: Diagnosis not present

## 2018-04-07 LAB — IGG, IGA, IGM
IGA/IMMUNOGLOBULIN A, SERUM: 174 mg/dL (ref 64–422)
IgG (Immunoglobin G), Serum: 776 mg/dL (ref 700–1600)
IgM (Immunoglobulin M), Srm: 55 mg/dL (ref 26–217)

## 2018-04-07 LAB — STREP PNEUMONIAE 23 SEROTYPES IGG
PNEUMO AB TYPE 20: 0.6 ug/mL — AB (ref >1.3)
PNEUMO AB TYPE 26 (6B): 0.3 ug/mL — AB (ref >1.3)
PNEUMO AB TYPE 43 (11A): 0.3 ug/mL — AB (ref >1.3)
PNEUMO AB TYPE 57 (19A): 0.5 ug/mL — AB (ref >1.3)
PNEUMO AB TYPE 70 (33F): 0.2 ug/mL — AB (ref >1.3)
PNEUMO AB TYPE 8: 0.7 ug/mL — AB (ref >1.3)
PNEUMO AB TYPE 9 (9N): 0.1 ug/mL — AB (ref >1.3)
Pneumo Ab Type 1*: <0.1 ug/mL — ABNORMAL LOW (ref >1.3)
Pneumo Ab Type 12 (12F)*: <0.1 ug/mL — ABNORMAL LOW (ref >1.3)
Pneumo Ab Type 14*: 3.8 ug/mL (ref >1.3)
Pneumo Ab Type 17 (17F)*: <0.1 ug/mL — ABNORMAL LOW (ref >1.3)
Pneumo Ab Type 19 (19F)*: 1.5 ug/mL (ref >1.3)
Pneumo Ab Type 2*: <0.1 ug/mL — ABNORMAL LOW (ref >1.3)
Pneumo Ab Type 23 (23F)*: 0.2 ug/mL — ABNORMAL LOW (ref >1.3)
Pneumo Ab Type 3*: <0.1 ug/mL — ABNORMAL LOW (ref >1.3)
Pneumo Ab Type 5*: <0.1 ug/mL — ABNORMAL LOW (ref >1.3)
Pneumo Ab Type 51 (7F)*: <0.1 ug/mL — ABNORMAL LOW (ref >1.3)
Pneumo Ab Type 54 (15B)*: 0.2 ug/mL — ABNORMAL LOW (ref >1.3)
Pneumo Ab Type 56 (18C)*: 0.2 ug/mL — ABNORMAL LOW (ref >1.3)
Pneumo Ab Type 68 (9V)*: 0.4 ug/mL — ABNORMAL LOW (ref >1.3)

## 2018-04-07 LAB — ALLERGENS W/TOTAL IGE AREA 2
Aspergillus Fumigatus IgE: <0.1 kU/L
Cat Dander IgE: <0.1 kU/L
Cedar, Mountain IgE: <0.1 kU/L
Common Silver Birch IgE: <0.1 kU/L
Cottonwood IgE: <0.1 kU/L
D Pteronyssinus IgE: <0.1 kU/L
Dog Dander IgE: <0.1 kU/L
Elm, American IgE: <0.1 kU/L
IgE (Immunoglobulin E), Serum: 69 IU/mL (ref 6–495)
Johnson Grass IgE: <0.1 kU/L
Maple/Box Elder IgE: <0.1 kU/L
Mouse Urine IgE: <0.1 kU/L
Ragweed, Short IgE: <0.1 kU/L
Sheep Sorrel IgE Qn: <0.1 kU/L

## 2018-04-07 LAB — CBC WITH DIFFERENTIAL/PLATELET
BASOS: 1 %
Basophils Absolute: 0.1 10*3/uL (ref 0.0–0.2)
EOS (ABSOLUTE): 0.2 10*3/uL (ref 0.0–0.4)
Eos: 2 %
Hematocrit: 41.9 % (ref 34.0–46.6)
Hemoglobin: 14.5 g/dL (ref 11.1–15.9)
IMMATURE GRANS (ABS): 0.1 10*3/uL (ref 0.0–0.1)
Immature Granulocytes: 1 %
LYMPHS: 26 %
Lymphocytes Absolute: 2.6 10*3/uL (ref 0.7–3.1)
MCH: 32.2 pg (ref 26.6–33.0)
MCHC: 34.6 g/dL (ref 31.5–35.7)
MCV: 93 fL (ref 79–97)
Monocytes Absolute: 0.9 10*3/uL (ref 0.1–0.9)
Monocytes: 9 %
Neutrophils Absolute: 6.4 10*3/uL (ref 1.4–7.0)
Neutrophils: 61 %
PLATELETS: 324 10*3/uL (ref 150–450)
RBC: 4.51 x10E6/uL (ref 3.77–5.28)
RDW: 13.6 % (ref 12.3–15.4)
WBC: 10.2 10*3/uL (ref 3.4–10.8)

## 2018-04-07 LAB — TETANUS ANTIBODY, IGG: Tetanus Ab, IgG: <0.1 IU/mL — ABNORMAL LOW (ref <0.10)

## 2018-04-07 MED ORDER — AZITHROMYCIN 250 MG PO TABS: ORAL_TABLET | ORAL | 0 refills | Status: DC

## 2018-04-07 MED ORDER — METHYLPREDNISOLONE ACETATE 80 MG/ML IJ SUSP
80.0000 mg | Freq: Once | INTRAMUSCULAR | Status: AC
  Administered 2018-04-07: 80 mg via INTRAMUSCULAR

## 2018-04-07 MED ORDER — TIOTROPIUM BROMIDE MONOHYDRATE 1.25 MCG/ACT IN AERS: 2.0000 | INHALATION_SPRAY | Freq: Every day | RESPIRATORY_TRACT | 5 refills | Status: DC

## 2018-04-07 NOTE — Patient Instructions (Addendum)
  1.  Treat and prevent inflammation:   A. Continue Dulera 124mcg 2 puffs twice a day.    B. Continue Montelukast 10 mg - One tablet one time per day  C. Start Spiriva 1.50mcg 2 puffs once a day (take midday around noon)  D. Continue Astelin 1-2 sprays each nostril twice a day for nasal drainage/post-nasal drip control  E.  Will also treat with Azithromycin course which also can have some anti-inflammatory properties  2.  Treat and prevent reflux:   A.  Continue Protonix 40 mg tablet in a.m.  B.  Continue famotidine 40 mg tablet in p.m.  C.  Slowly taper off all forms of caffeine including chocolate  D.  Stay off fish oil supplementation  3.  If needed:   A.  Nasal saline spray  B.  Pro-air HFA 2 puffs every 4-6 hours  4. Lab results revealed you are not protected against tetanus or strep pneumonia strains.  Recommend booster to tetanus and strep pneumonia with Pneumovax.  Check with your PCP if you can get both vaccines together at PCP office.  If not you can receive Pneumovax here.    5.  Return to clinic in 4-6 weeks or earlier if problem.

## 2018-04-07 NOTE — Progress Notes (Signed)
Follow-up Note  RE: Jody Taylor MRN: [id number] DOB: 09-Jul-1940 Date of Office Visit: 04/07/2018   History of present illness: Jody Taylor is a 77 y.o. female presenting today for follow-up of asthma with ongoing symptoms.  She also has history of LPRD and rhinitis related to PND.  She was last seen in the office by myself on 03/24/18.  She presents today with her neighbor.  She states since last visit she does feel the [facility] is better than Asmanex use as far as symptoms control but still states she is coughing and wheezing often.  With her cough she states she is coughing up an apple green colored sputum.  She states the cough is keeping her up at night.   Her neighbor states they usually will walk to the store and it doesn't take long for her to start wheezing.  She states she is using her albuterol mostly now with activity like walking to the store.  She continues on singulair.  She also increased astelin use to twice a day which she does feel has helped as well but also still states she has nasal drainage down her throat and she has been having episodes of hoarseness.  She also takes protonix and famotidine.     After last visit she was arranged a CT sinus which did not reveal evidence of sinusitis.  She also had labwork done for recurrent infections which does reveal she is not protected against tetanus or strep pnuemo strains (2/23 protected).  Immunoglobulins all in normal range (IgG low normal).  Environmental panel was negative.  See results below.   Review of systems: Review of Systems  Constitutional: Positive for malaise/fatigue. Negative for chills and fever.  HENT: Positive for congestion and sore throat. Negative for ear discharge, ear pain, nosebleeds and sinus pain.   Eyes: Negative for pain, discharge and redness.  Respiratory: Positive for cough, sputum production, shortness of breath and wheezing. Negative for hemoptysis.   Cardiovascular: Negative for chest pain.    Gastrointestinal: Negative for abdominal pain, constipation, diarrhea, heartburn, nausea and vomiting.  Musculoskeletal: Negative for joint pain.  Skin: Negative for itching and rash.  Neurological: Negative for headaches.    All other systems negative unless noted above in HPI  Past medical/social/surgical/family history have been reviewed and are unchanged unless specifically indicated below.  No changes  Medication List: Allergies as of 04/07/2018      Reactions   Ace Inhibitors Other (See Comments), Cough   CHEST PAIN   Other    Patient reports receiving blood after a miscarriage "years ago". She states she broke out from receiving this blood. Has not received any since   Codeine Nausea And Vomiting   Levaquin [levofloxacin] Nausea And Vomiting      Medication List        Accurate as of 04/07/18  4:17 PM. Always use your most recent med list.          aspirin EC 81 MG tablet Take 81 mg by mouth daily.   azelastine 0.1 % nasal spray Commonly known as:  ASTELIN Use two sprays in each nostril twice daily   azithromycin 250 MG tablet Commonly known as:  ZITHROMAX Take 500mg  on Day 1. Days 2-5 take 250mg .   buPROPion 150 MG 12 hr tablet Commonly known as:  WELLBUTRIN SR Take 150 mg by mouth 2 (two) times daily. Verified SR   citalopram 40 MG tablet Commonly known as:  CELEXA Take 40 mg  by mouth daily.   DULERA 100-5 MCG/ACT Aero Generic drug:  mometasone-formoterol Inhale two puffs twice daily to prevent cough or wheeze. Rinse mouth after use.   famotidine 40 MG tablet Commonly known as:  PEPCID Take 1 tablet (40 mg total) by mouth at bedtime.   ipratropium-albuterol 0.5-2.5 (3) MG/3ML Soln Commonly known as:  DUONEB Take 3 mLs by nebulization every 6 (six) hours as needed.   isosorbide mononitrate 120 MG 24 hr tablet Commonly known as:  IMDUR Take 120 mg by mouth daily.   levothyroxine 75 MCG tablet Commonly known as:  SYNTHROID, LEVOTHROID    metoprolol tartrate 50 MG tablet Commonly known as:  LOPRESSOR Take 50 mg by mouth 2 (two) times daily. Verified tartrate   Mometasone Furoate 200 MCG/ACT Aero Inhale 2 puffs into the lungs 2 (two) times daily. Rinse, gargle, and spit after use.   montelukast 10 MG tablet Commonly known as:  SINGULAIR TAKE ONE TABLET BY MOUTH EVERY NIGHT AT BEDTIME   nitroGLYCERIN 0.4 MG SL tablet Commonly known as:  NITROSTAT   pantoprazole 40 MG tablet Commonly known as:  PROTONIX Take 40 mg by mouth daily.   potassium chloride SA 20 MEQ tablet Commonly known as:  K-DUR,KLOR-CON Take 40 mEq by mouth daily.   PROAIR HFA 108 (90 Base) MCG/ACT inhaler Generic drug:  albuterol Inhale 2 puffs into the lungs every 6 (six) hours as needed for wheezing or shortness of breath.   QUEtiapine 25 MG tablet Commonly known as:  SEROQUEL Take 75 mg by mouth at bedtime. Takes 3 at bedtime   Tiotropium Bromide Monohydrate 1.25 MCG/ACT Aers Inhale 2 Doses into the lungs daily. Rinse, gargle and spit after use   torsemide 20 MG tablet Commonly known as:  DEMADEX Take 20 mg by mouth daily.       Known medication allergies: Allergies  Allergen Reactions  . Ace Inhibitors Other (See Comments) and Cough    CHEST PAIN  . Other     Patient reports receiving blood after a miscarriage "years ago". She states she broke out from receiving this blood. Has not received any since  . Codeine Nausea And Vomiting  . Levaquin [Levofloxacin] Nausea And Vomiting     Physical examination: Blood pressure 124/78, pulse 83, resp. rate 18.  General: Alert, interactive, in no acute distress. HEENT: PERRLA, TMs pearly gray, turbinates moderately edematous with clear discharge, post-pharynx non erythematous. Neck: Supple without lymphadenopathy. Lungs: Decreased breath sounds with expiratory wheezing bilaterally. Mildly increased work of breathing. CV: Normal S1, S2 without murmurs. Abdomen: Nondistended,  nontender. Skin: Warm and dry, without lesions or rashes. Extremities:  No clubbing, cyanosis or edema. Neuro:   Grossly intact.  Diagnositics/Labs: Labs:  Component     Latest Ref Rng & Units 03/31/2018  IgE (Immunoglobulin E), Serum     6 - 495 IU/mL 69  D Pteronyssinus IgE     Class 0 kU/L <0.10  D Farinae IgE     Class 0 kU/L <0.10  Cat Dander IgE     Class 0 kU/L <0.10  Dog Dander IgE     Class 0 kU/L <0.10  [place] Grass IgE     Class 0 kU/L <0.10  Timothy Grass IgE     Class 0 kU/L <0.10  Johnson Grass IgE     Class 0 kU/L <0.10  Cockroach, German IgE     Class 0 kU/L <0.10  Penicillium Chrysogen IgE     Class 0 kU/L <0.10  Cladosporium Herbarum IgE     Class 0 kU/L <0.10  Aspergillus Fumigatus IgE     Class 0 kU/L <0.10  Alternaria Alternata IgE     Class 0 kU/L <0.10  Maple/Box Elder IgE     Class 0 kU/L <0.10  Common Silver Wendee Copp IgE     Class 0 kU/L <0.10  Cedar, [place] IgE     Class 0 kU/L <0.10  Oak, White IgE     Class 0 kU/L <0.10  Elm, American IgE     Class 0 kU/L <0.10  Cottonwood IgE     Class 0 kU/L <0.10  Pecan, Hickory IgE     Class 0 kU/L <0.10  White Mulberry IgE     Class 0 kU/L <0.10  Ragweed, Short IgE     Class 0 kU/L <0.10  Pigweed, Rough IgE     Class 0 kU/L <0.10  Sheep Sorrel IgE Qn     Class 0 kU/L <0.10  Mouse Urine IgE     Class 0 kU/L <0.10  Pneumo Ab Type 1*     >1.3 ug/mL <0.1 (L)  Pneumo Ab Type 3*     >1.3 ug/mL <0.1 (L)  Pneumo Ab Type 4*     >1.3 ug/mL <0.1 (L)  Pneumo Ab Type 8*     >1.3 ug/mL 0.7 (L)  Pneumo Ab Type 9 (9N)*     >1.3 ug/mL 0.1 (L)  Pneumo Ab Type 12 (63F)*     >1.3 ug/mL <0.1 (L)  Pneumo Ab Type 14*     >1.3 ug/mL 3.8  Pneumo Ab Type 17 (63F)*     >1.3 ug/mL <0.1 (L)  Pneumo Ab Type 19 (44F)*     >1.3 ug/mL 1.5  Pneumo Ab Type 2*     >1.3 ug/mL <0.1 (L)  Pneumo Ab Type 20*     >1.3 ug/mL 0.6 (L)  Pneumo Ab Type 22 (550F)*     >1.3 ug/mL <0.1 (L)  Pneumo Ab Type 23 (80F)*      >1.3 ug/mL 0.2 (L)  Pneumo Ab Type 26 (6B)*     >1.3 ug/mL 0.3 (L)  Pneumo Ab Type 34 (10A)*     >1.3 ug/mL <0.1 (L)  Pneumo Ab Type 43 (11A)*     >1.3 ug/mL 0.3 (L)  Pneumo Ab Type 5*     >1.3 ug/mL <0.1 (L)  Pneumo Ab Type 51 (50F)*     >1.3 ug/mL <0.1 (L)  Pneumo Ab Type 54 (15B)*     >1.3 ug/mL 0.2 (L)  Pneumo Ab Type 56 (18C)*     >1.3 ug/mL 0.2 (L)  Pneumo Ab Type 57 (19A)*     >1.3 ug/mL 0.5 (L)  Pneumo Ab Type 68 (9V)*     >1.3 ug/mL 0.4 (L)  Pneumo Ab Type 70 (42F)*     >1.3 ug/mL 0.2 (L)  WBC     3.4 - 10.8 x10E3/uL 10.2  RBC     3.77 - 5.28 x10E6/uL 4.51  Hemoglobin     11.1 - 15.9 g/dL 14.5  HCT     34.0 - 46.6 % 41.9  MCV     79 - 97 fL 93  MCH     26.6 - 33.0 pg 32.2  MCHC     31.5 - 35.7 g/dL 34.6  RDW     12.3 - 15.4 % 13.6  Platelets     150 - 450 x10E3/uL 324  Neutrophils     Not Estab. %  61  Lymphs     Not Estab. % 26  Monocytes     Not Estab. % 9  Eos     Not Estab. % 2  Basos     Not Estab. % 1  NEUT#     1.4 - 7.0 x10E3/uL 6.4  Lymphocyte #     0.7 - 3.1 x10E3/uL 2.6  Monocytes Absolute     0.1 - 0.9 x10E3/uL 0.9  EOS (ABSOLUTE)     0.0 - 0.4 x10E3/uL 0.2  Basophils Absolute     0.0 - 0.2 x10E3/uL 0.1  Immature Granulocytes     Not Estab. % 1  Immature Grans (Abs)     0.0 - 0.1 x10E3/uL 0.1  Hematology Comments:      Note:  IgG (Immunoglobin G), Serum     700 - 1,600 mg/dL 776  IgA/Immunoglobulin A, Serum     64 - 422 mg/dL 174  IgM (Immunoglobulin M), Srm     26 - 217 mg/dL 55  Tetanus Ab, IgG     <0.10 IU/mL <0.10 (L)    Spirometry: FEV1: 0.95L 51%, FVC: 1.29L 51%.  S/p Xopenex+atrovent neb with significant improvement in FEV1 to 1.18L on 63% which is a 24% increase.  FVC improved to 1.53L 61%  Assessment and plan:   Severe persistent asthma with exacerbation- still not well controlled and still quite symptomatic despite Dulera use.  Lung function is quite depressed from last visit with significant response to  bronchodilator.  Will add in Spiriva and will also provide with depomedrol injection in the office.  Will also have her take course of azithromycin for anti-inflammatory properties as well as presumed chronic bronchitis with colored sputum production.  It does appear that she would be eligible for anti-IL5 or anti-IL4Ra biologic agent with eos abs count of 200.   LPRD - continue protonix and famotidine as providing relief Rhinitis - she has a significant amount of PND also exacerbating cough symptoms and she will continue use of astelin Recurrent respiratory tract infections - she shows very poor protection to strep pneumo strains and to tetanus.  She has normal Ig levels and and neg environmental panel and unremarkable CBC.   1.  Treat and prevent inflammation:   A. Continue Dulera 179mcg 2 puffs twice a day.    B. Continue Montelukast 10 mg - One tablet one time per day  C. Start Spiriva 1.83mcg 2 puffs once a day (take midday around noon)  D. Continue Astelin 1-2 sprays each nostril twice a day for nasal drainage/post-nasal drip control  E.  Will also treat with Azithromycin course which also can have some anti-inflammatory properties  2.  Treat and prevent reflux:   A.  Continue Protonix 40 mg tablet in a.m.  B.  Continue famotidine 40 mg tablet in p.m.  C.  Slowly taper off all forms of caffeine including chocolate  D.  Stay off fish oil supplementation  3.  If needed:   A.  Nasal saline spray  B.  Pro-air HFA 2 puffs every 4-6 hours  4. Lab results revealed you are not protected against tetanus or strep pneumonia strains.  Recommend booster to tetanus and strep pneumonia with Pneumovax (received prevnar about a year ago).  Advised to check with PCP if you she can get both vaccines together at PCP's office.  If not she can receive Pneumovax here.    5.  Return to clinic in 4-6 weeks or earlier if problem.  I appreciate the opportunity to take part in Adyn's care. Please do not  hesitate to contact me with questions.  Sincerely,   Prudy Feeler, MD Allergy/Immunology [facility] [place]

## 2018-04-09 ENCOUNTER — Encounter: Payer: Self-pay | Admitting: Allergy

## 2018-04-09 DIAGNOSIS — J208 Acute bronchitis due to other specified organisms: Secondary | ICD-10-CM | POA: Diagnosis not present

## 2018-04-21 DIAGNOSIS — J45991 Cough variant asthma: Secondary | ICD-10-CM | POA: Diagnosis not present

## 2018-04-21 DIAGNOSIS — R6889 Other general symptoms and signs: Secondary | ICD-10-CM | POA: Diagnosis not present

## 2018-04-21 DIAGNOSIS — Z8709 Personal history of other diseases of the respiratory system: Secondary | ICD-10-CM | POA: Diagnosis not present

## 2018-04-21 DIAGNOSIS — H6121 Impacted cerumen, right ear: Secondary | ICD-10-CM | POA: Diagnosis not present

## 2018-04-21 DIAGNOSIS — K219 Gastro-esophageal reflux disease without esophagitis: Secondary | ICD-10-CM | POA: Diagnosis not present

## 2018-04-21 DIAGNOSIS — Z23 Encounter for immunization: Secondary | ICD-10-CM | POA: Diagnosis not present

## 2018-04-28 ENCOUNTER — Ambulatory Visit (INDEPENDENT_AMBULATORY_CARE_PROVIDER_SITE_OTHER): Payer: Medicare Other | Admitting: Allergy

## 2018-04-28 ENCOUNTER — Encounter: Payer: Self-pay | Admitting: Allergy

## 2018-04-28 VITALS — BP 114/88 | HR 76 | Resp 20

## 2018-04-28 DIAGNOSIS — J4550 Severe persistent asthma, uncomplicated: Secondary | ICD-10-CM | POA: Diagnosis not present

## 2018-04-28 DIAGNOSIS — J988 Other specified respiratory disorders: Secondary | ICD-10-CM | POA: Diagnosis not present

## 2018-04-28 DIAGNOSIS — K219 Gastro-esophageal reflux disease without esophagitis: Secondary | ICD-10-CM | POA: Diagnosis not present

## 2018-04-28 DIAGNOSIS — J310 Chronic rhinitis: Secondary | ICD-10-CM | POA: Diagnosis not present

## 2018-04-28 MED ORDER — MOMETASONE FURO-FORMOTEROL FUM 200-5 MCG/ACT IN AERO: INHALATION_SPRAY | RESPIRATORY_TRACT | 5 refills | Status: DC

## 2018-04-28 NOTE — Progress Notes (Signed)
Follow-up Note  RE: Jody Taylor MRN: [id number] DOB: 1941/03/01 Date of Office Visit: 04/28/2018   History of present illness: Jody Taylor is a 77 y.o. female presenting today for follow-up of asthma.  She was last seen in the office on 04/07/18 by myself.  She presents today with her daughter.  She states she is doing better than last visit.  At her last visit I gave her a depomedrol injection and azithromycin course for antinflammatory properties which she completed.  She however did go to her PCP shortly after last visit and was prescribed additional steroid, tussionex and augmentin.  She has completed the antibiotic and steroid.  She has used tussionex but does not use every day.   She has had her flu vaccine this season.  She states she has less cough and SOB but does still have wheezing and is using albuterol about once a day now.  She does continue on Dulera 100 mcg 2 puffs twice a day as well as Spiriva 1.25 mcg 2 puffs once a day.  She does feel this addition of the Spiriva at last visit has been helpful as well. She also continues to use Astelin for nasal drainage and she also continues to take Protonix and famotidine for her reflux control. She has not been able to get the Pneumovax with a tetanus booster from her PCP.   Review of systems: Review of Systems  Constitutional: Negative for chills, fever and malaise/fatigue.  HENT: Negative for congestion, ear discharge, nosebleeds and sore throat.   Eyes: Negative for pain, discharge and redness.  Respiratory: Positive for cough, shortness of breath and wheezing.   Cardiovascular: Negative for chest pain.  Gastrointestinal: Negative for abdominal pain, constipation, diarrhea, heartburn, nausea and vomiting.  Musculoskeletal: Negative for joint pain.  Skin: Negative for itching and rash.  Neurological: Negative for headaches.    All other systems negative unless noted above in HPI  Past medical/social/surgical/family  history have been reviewed and are unchanged unless specifically indicated below.  No changes  Medication List: Allergies as of 04/28/2018      Reactions   Ace Inhibitors Other (See Comments), Cough   CHEST PAIN   Other    Patient reports receiving blood after a miscarriage "years ago". She states she broke out from receiving this blood. Has not received any since   Codeine Nausea And Vomiting   Levaquin [levofloxacin] Nausea And Vomiting      Medication List        Accurate as of 04/28/18  4:59 PM. Always use your most recent med list.          aspirin EC 81 MG tablet Take 81 mg by mouth daily.   azelastine 0.1 % nasal spray Commonly known as:  ASTELIN Use two sprays in each nostril twice daily   buPROPion 150 MG 12 hr tablet Commonly known as:  WELLBUTRIN SR Take 150 mg by mouth 2 (two) times daily. Verified SR   citalopram 40 MG tablet Commonly known as:  CELEXA Take 40 mg by mouth daily.   famotidine 40 MG tablet Commonly known as:  PEPCID Take 1 tablet (40 mg total) by mouth at bedtime.   ipratropium-albuterol 0.5-2.5 (3) MG/3ML Soln Commonly known as:  DUONEB Take 3 mLs by nebulization every 6 (six) hours as needed.   isosorbide mononitrate 120 MG 24 hr tablet Commonly known as:  IMDUR Take 120 mg by mouth daily.   levothyroxine 75 MCG tablet Commonly known  as:  SYNTHROID, LEVOTHROID   metoprolol tartrate 50 MG tablet Commonly known as:  LOPRESSOR Take 50 mg by mouth 2 (two) times daily. Verified tartrate   Mometasone Furoate 200 MCG/ACT Aero Inhale 2 puffs into the lungs 2 (two) times daily. Rinse, gargle, and spit after use.   mometasone-formoterol 200-5 MCG/ACT Aero Commonly known as:  DULERA Inhale two puffs twice daily to prevent cough or wheeze.  Rinse, gargle, and spit after use.   montelukast 10 MG tablet Commonly known as:  SINGULAIR TAKE ONE TABLET BY MOUTH EVERY NIGHT AT BEDTIME   nitroGLYCERIN 0.4 MG SL tablet Commonly known as:   NITROSTAT   pantoprazole 40 MG tablet Commonly known as:  PROTONIX Take 40 mg by mouth daily.   potassium chloride SA 20 MEQ tablet Commonly known as:  K-DUR,KLOR-CON Take 40 mEq by mouth daily.   PROAIR HFA 108 (90 Base) MCG/ACT inhaler Generic drug:  albuterol Inhale 2 puffs into the lungs every 6 (six) hours as needed for wheezing or shortness of breath.   QUEtiapine 25 MG tablet Commonly known as:  SEROQUEL Take 75 mg by mouth at bedtime. Takes 3 at bedtime   Tiotropium Bromide Monohydrate 1.25 MCG/ACT Aers Inhale 2 Doses into the lungs daily. Rinse, gargle and spit after use   torsemide 20 MG tablet Commonly known as:  DEMADEX Take 20 mg by mouth daily.   TUSSIONEX PENNKINETIC ER 10-8 MG/5ML Suer Generic drug:  chlorpheniramine-HYDROcodone Take 5 mLs by mouth at bedtime as needed for cough.       Known medication allergies: Allergies  Allergen Reactions  . Ace Inhibitors Other (See Comments) and Cough    CHEST PAIN  . Other     Patient reports receiving blood after a miscarriage "years ago". She states she broke out from receiving this blood. Has not received any since  . Codeine Nausea And Vomiting  . Levaquin [Levofloxacin] Nausea And Vomiting     Physical examination: Blood pressure 114/88, pulse 76, resp. rate 20.  General: Alert, interactive, in no acute distress. HEENT: PERRLA, TMs pearly gray, turbinates mildly edematous without discharge, post-pharynx non erythematous. Neck: Supple without lymphadenopathy. Lungs: Mildly decreased breath sounds with expiratory wheezing bilaterally. {no increased work of breathing.  Improved from previous exam CV: Normal S1, S2 without murmurs. Abdomen: Nondistended, nontender. Skin: Warm and dry, without lesions or rashes. Extremities:  No clubbing, cyanosis or edema. Neuro:   Grossly intact.  Diagnositics/Labs:  Spirometry: FEV1: 1.39L 74%, FVC: 1.82L 73%.  Near normal for age and demographics.  This is much  improved from previous study  Assessment and plan:   Severe persistent asthma-improved with use of Dulera plus Spiriva however her control is still not good.    She has had several rounds of steroid and antibiotics in the past couple of months.  Lung function is much better today.  She will continue her current regimen as below and I did discuss with her Berna Bue as I do feel that this may be effective in controlling her asthma better.  Berna Bue benefits and risk discussed today as well as protocol.  We will initiate approval process. LPRD - continue protonix and famotidine as providing relief Rhinitis - she has a significant amount of PND also exacerbating cough symptoms and she will continue use of astelin.  Recurrent respiratory tract infections - she shows very poor protection to strep pneumo strains and to tetanus.  She has normal Ig levels and and neg environmental panel and unremarkable CBC.  We did call her PCP today and she should be able to receive these vaccines at her PCPs office today.  We will need to obtain repeat titers after about 4 to 6 weeks from vaccination.   1.  Treat and prevent inflammation:   A. Continue Dulera 175mcg 2 puffs twice a day.    B. Continue Montelukast 10 mg - One tablet one time per day  C. Continue Spiriva 1.31mcg 2 puffs once a day (take midday around noon)  D. Continue Astelin 1-2 sprays each nostril twice a day for nasal drainage/post-nasal drip control    2.  Treat and prevent reflux:   A.  Continue Protonix 40 mg tablet in a.m.  B.  Continue famotidine 40 mg tablet in p.m.  C.  Slowly taper off all forms of caffeine including chocolate  D.  Stay off fish oil supplementation  3.  If needed:   A.  Nasal saline spray  B.  Pro-air HFA 2 puffs every 4-6 hours  4. Lab results revealed you are not protected against tetanus or strep pneumonia strains.  Recommend booster to tetanus and strep pneumonia with Pneumovax.  We do not have pneumovax in office  today to provide.    5.  We discussed Fasenra injectable asthma medication today.  Berna Bue helps to decrease amount of eosinophils that may be leading to respiratory/asthma symptoms.  Berna Bue helps to decrease asthma symptoms, asthma exacerbations and need for oral/systemic steroid.   Berna Bue is dose once a month x 3 months then every 8 weeks thereafter.   Return to clinic in 3-4 months or earlier if problem. I appreciate the opportunity to take part in Aisa's care. Please do not hesitate to contact me with questions.  Sincerely,   Prudy Feeler, MD Allergy/Immunology [facility]

## 2018-04-28 NOTE — Patient Instructions (Addendum)
  1.  Treat and prevent inflammation:   A. Continue Dulera 12mcg 2 puffs twice a day.    B. Continue Montelukast 10 mg - One tablet one time per day  C. Continue Spiriva 1.9mcg 2 puffs once a day (take midday around noon)  D. Continue Astelin 1-2 sprays each nostril twice a day for nasal drainage/post-nasal drip control    2.  Treat and prevent reflux:   A.  Continue Protonix 40 mg tablet in a.m.  B.  Continue famotidine 40 mg tablet in p.m.  C.  Slowly taper off all forms of caffeine including chocolate  D.  Stay off fish oil supplementation  3.  If needed:   A.  Nasal saline spray  B.  Pro-air HFA 2 puffs every 4-6 hours  4. Lab results revealed you are not protected against tetanus or strep pneumonia strains.  Recommend booster to tetanus and strep pneumonia with Pneumovax.  We do not have pneumovax in office today to provide.  We will touch base with your PCP to see if they can provide otherwise we can arrange shipment of pneumovax to our office.    5.  We discussed Fasenra injectable asthma medication today.  Berna Bue helps to decrease amount of eosinophils that may be leading to respiratory/asthma symptoms.  Berna Bue helps to decrease asthma symptoms, asthma exacerbations and need for oral/systemic steroid.   Berna Bue is dose once a month x 3 months then every 8 weeks thereafter.   Return to clinic in 3-4 months or earlier if problem.

## 2018-04-29 ENCOUNTER — Other Ambulatory Visit: Payer: Self-pay

## 2018-04-29 ENCOUNTER — Telehealth: Payer: Self-pay | Admitting: Allergy

## 2018-04-29 MED ORDER — FLUTICASONE-UMECLIDIN-VILANT 100-62.5-25 MCG/INH IN AEPB: 1.0000 | INHALATION_SPRAY | Freq: Every day | RESPIRATORY_TRACT | 5 refills | Status: DC

## 2018-04-29 MED ORDER — MONTELUKAST SODIUM 10 MG PO TABS: 10.0000 mg | ORAL_TABLET | Freq: Every day | ORAL | 5 refills | Status: DC

## 2018-04-29 NOTE — Telephone Encounter (Signed)
Jody Taylor would like a refill sent to [facility] for Montelukast.  Also, Jody Taylor states the [place] cost a couple of hundred dollars and she cannot afford to take it.  Jody Taylor would like to know if there is anything else she can do/take?

## 2018-04-29 NOTE — Telephone Encounter (Signed)
Unfortunate with the inhaler coverage.  Up to the point she has been getting Dulera samples from either [place] or her PCP.  Will insurance cover Trelegy?    That would be an option since she is on spiriva as well as dulera.   Let's check into Trelegy for her.

## 2018-04-29 NOTE — Telephone Encounter (Signed)
Rx for Montelukast sent to [facility].  Looks like [place] may be tier 4 with Breo, Advair, Symbicort all tier 3.  Would you like to change Rx?

## 2018-04-29 NOTE — Telephone Encounter (Signed)
Rx for Trelegy sent to [facility].  Informed patient of change in RX and asked her to stop her Dulera and Spiriva.

## 2018-05-07 ENCOUNTER — Ambulatory Visit (INDEPENDENT_AMBULATORY_CARE_PROVIDER_SITE_OTHER): Payer: Medicare Other | Admitting: *Deleted

## 2018-05-07 DIAGNOSIS — J4550 Severe persistent asthma, uncomplicated: Secondary | ICD-10-CM

## 2018-05-07 MED ORDER — BENRALIZUMAB 30 MG/ML ~~LOC~~ SOSY
30.0000 mg | PREFILLED_SYRINGE | SUBCUTANEOUS | Status: AC
  Administered 2018-05-07 – 2018-07-02 (×3): 30 mg via SUBCUTANEOUS

## 2018-05-11 DIAGNOSIS — J208 Acute bronchitis due to other specified organisms: Secondary | ICD-10-CM | POA: Diagnosis not present

## 2018-05-15 DIAGNOSIS — M79622 Pain in left upper arm: Secondary | ICD-10-CM | POA: Diagnosis not present

## 2018-05-15 DIAGNOSIS — M25512 Pain in left shoulder: Secondary | ICD-10-CM | POA: Diagnosis not present

## 2018-05-15 DIAGNOSIS — M25519 Pain in unspecified shoulder: Secondary | ICD-10-CM | POA: Diagnosis not present

## 2018-05-20 DIAGNOSIS — Z139 Encounter for screening, unspecified: Secondary | ICD-10-CM | POA: Diagnosis not present

## 2018-05-20 DIAGNOSIS — M25512 Pain in left shoulder: Secondary | ICD-10-CM | POA: Diagnosis not present

## 2018-05-20 DIAGNOSIS — M79622 Pain in left upper arm: Secondary | ICD-10-CM | POA: Diagnosis not present

## 2018-05-21 NOTE — Addendum Note (Signed)
Addended by: Carin Hock on: 05/21/2018 09:19 AM   Modules accepted: Orders

## 2018-05-27 DIAGNOSIS — I10 Essential (primary) hypertension: Secondary | ICD-10-CM | POA: Diagnosis not present

## 2018-05-27 DIAGNOSIS — N390 Urinary tract infection, site not specified: Secondary | ICD-10-CM | POA: Diagnosis not present

## 2018-05-27 DIAGNOSIS — N183 Chronic kidney disease, stage 3 (moderate): Secondary | ICD-10-CM | POA: Diagnosis not present

## 2018-05-27 DIAGNOSIS — E119 Type 2 diabetes mellitus without complications: Secondary | ICD-10-CM | POA: Diagnosis not present

## 2018-05-28 DIAGNOSIS — M75100 Unspecified rotator cuff tear or rupture of unspecified shoulder, not specified as traumatic: Secondary | ICD-10-CM | POA: Diagnosis not present

## 2018-05-28 DIAGNOSIS — M12819 Other specific arthropathies, not elsewhere classified, unspecified shoulder: Secondary | ICD-10-CM | POA: Diagnosis not present

## 2018-06-04 ENCOUNTER — Ambulatory Visit (INDEPENDENT_AMBULATORY_CARE_PROVIDER_SITE_OTHER): Payer: Medicare Other | Admitting: *Deleted

## 2018-06-04 DIAGNOSIS — J4550 Severe persistent asthma, uncomplicated: Secondary | ICD-10-CM

## 2018-06-22 DIAGNOSIS — Z7689 Persons encountering health services in other specified circumstances: Secondary | ICD-10-CM | POA: Diagnosis not present

## 2018-06-25 DIAGNOSIS — M75100 Unspecified rotator cuff tear or rupture of unspecified shoulder, not specified as traumatic: Secondary | ICD-10-CM | POA: Diagnosis not present

## 2018-07-02 ENCOUNTER — Ambulatory Visit (INDEPENDENT_AMBULATORY_CARE_PROVIDER_SITE_OTHER): Payer: Medicare Other | Admitting: *Deleted

## 2018-07-02 DIAGNOSIS — J4550 Severe persistent asthma, uncomplicated: Secondary | ICD-10-CM | POA: Diagnosis not present

## 2018-07-03 DIAGNOSIS — M25512 Pain in left shoulder: Secondary | ICD-10-CM | POA: Diagnosis not present

## 2018-07-07 DIAGNOSIS — M75101 Unspecified rotator cuff tear or rupture of right shoulder, not specified as traumatic: Secondary | ICD-10-CM | POA: Diagnosis not present

## 2018-07-13 DIAGNOSIS — S81002A Unspecified open wound, left knee, initial encounter: Secondary | ICD-10-CM | POA: Diagnosis not present

## 2018-07-13 DIAGNOSIS — Z23 Encounter for immunization: Secondary | ICD-10-CM | POA: Diagnosis not present

## 2018-07-15 DIAGNOSIS — I10 Essential (primary) hypertension: Secondary | ICD-10-CM | POA: Diagnosis not present

## 2018-07-15 DIAGNOSIS — I509 Heart failure, unspecified: Secondary | ICD-10-CM | POA: Diagnosis not present

## 2018-07-15 DIAGNOSIS — S81832A Puncture wound without foreign body, left lower leg, initial encounter: Secondary | ICD-10-CM | POA: Diagnosis not present

## 2018-07-15 DIAGNOSIS — I2510 Atherosclerotic heart disease of native coronary artery without angina pectoris: Secondary | ICD-10-CM | POA: Diagnosis not present

## 2018-07-21 ENCOUNTER — Other Ambulatory Visit: Payer: Self-pay | Admitting: *Deleted

## 2018-07-21 MED ORDER — FAMOTIDINE 40 MG PO TABS: 40.0000 mg | ORAL_TABLET | Freq: Every day | ORAL | 0 refills | Status: DC

## 2018-07-21 NOTE — Telephone Encounter (Signed)
Patient requested 90 sent to [facility] for famotidine due to shortage at pharmacies.  Advised her I would send one with no refills she is due for appt in April for followup

## 2018-07-22 ENCOUNTER — Other Ambulatory Visit: Payer: Self-pay | Admitting: Allergy and Immunology

## 2018-07-22 DIAGNOSIS — I10 Essential (primary) hypertension: Secondary | ICD-10-CM | POA: Diagnosis not present

## 2018-07-22 DIAGNOSIS — I2510 Atherosclerotic heart disease of native coronary artery without angina pectoris: Secondary | ICD-10-CM | POA: Diagnosis not present

## 2018-07-22 DIAGNOSIS — I509 Heart failure, unspecified: Secondary | ICD-10-CM | POA: Diagnosis not present

## 2018-07-22 NOTE — Telephone Encounter (Signed)
Courtesy refill  

## 2018-07-28 DIAGNOSIS — Z0000 Encounter for general adult medical examination without abnormal findings: Secondary | ICD-10-CM | POA: Diagnosis not present

## 2018-07-28 DIAGNOSIS — J208 Acute bronchitis due to other specified organisms: Secondary | ICD-10-CM | POA: Diagnosis not present

## 2018-07-28 DIAGNOSIS — Z9181 History of falling: Secondary | ICD-10-CM | POA: Diagnosis not present

## 2018-07-28 DIAGNOSIS — E785 Hyperlipidemia, unspecified: Secondary | ICD-10-CM | POA: Diagnosis not present

## 2018-08-01 DIAGNOSIS — I10 Essential (primary) hypertension: Secondary | ICD-10-CM | POA: Diagnosis not present

## 2018-08-01 DIAGNOSIS — I2510 Atherosclerotic heart disease of native coronary artery without angina pectoris: Secondary | ICD-10-CM | POA: Diagnosis not present

## 2018-08-01 DIAGNOSIS — D638 Anemia in other chronic diseases classified elsewhere: Secondary | ICD-10-CM | POA: Diagnosis not present

## 2018-08-01 DIAGNOSIS — E039 Hypothyroidism, unspecified: Secondary | ICD-10-CM | POA: Diagnosis not present

## 2018-08-01 DIAGNOSIS — N183 Chronic kidney disease, stage 3 (moderate): Secondary | ICD-10-CM | POA: Diagnosis not present

## 2018-08-01 DIAGNOSIS — E785 Hyperlipidemia, unspecified: Secondary | ICD-10-CM | POA: Diagnosis not present

## 2018-08-01 DIAGNOSIS — I5032 Chronic diastolic (congestive) heart failure: Secondary | ICD-10-CM | POA: Diagnosis not present

## 2018-08-01 DIAGNOSIS — E538 Deficiency of other specified B group vitamins: Secondary | ICD-10-CM | POA: Diagnosis not present

## 2018-08-04 DIAGNOSIS — S81832S Puncture wound without foreign body, left lower leg, sequela: Secondary | ICD-10-CM | POA: Diagnosis not present

## 2018-08-04 DIAGNOSIS — I2510 Atherosclerotic heart disease of native coronary artery without angina pectoris: Secondary | ICD-10-CM | POA: Diagnosis not present

## 2018-08-04 DIAGNOSIS — I10 Essential (primary) hypertension: Secondary | ICD-10-CM | POA: Diagnosis not present

## 2018-08-04 DIAGNOSIS — I509 Heart failure, unspecified: Secondary | ICD-10-CM | POA: Diagnosis not present

## 2018-08-25 DIAGNOSIS — R600 Localized edema: Secondary | ICD-10-CM | POA: Diagnosis not present

## 2018-08-25 DIAGNOSIS — J208 Acute bronchitis due to other specified organisms: Secondary | ICD-10-CM | POA: Diagnosis not present

## 2018-08-25 DIAGNOSIS — I2510 Atherosclerotic heart disease of native coronary artery without angina pectoris: Secondary | ICD-10-CM | POA: Diagnosis not present

## 2018-08-25 DIAGNOSIS — I10 Essential (primary) hypertension: Secondary | ICD-10-CM | POA: Diagnosis not present

## 2018-08-25 DIAGNOSIS — I509 Heart failure, unspecified: Secondary | ICD-10-CM | POA: Diagnosis not present

## 2018-08-25 DIAGNOSIS — S81832S Puncture wound without foreign body, left lower leg, sequela: Secondary | ICD-10-CM | POA: Diagnosis not present

## 2018-08-27 ENCOUNTER — Ambulatory Visit (INDEPENDENT_AMBULATORY_CARE_PROVIDER_SITE_OTHER): Payer: Medicare Other | Admitting: *Deleted

## 2018-08-27 DIAGNOSIS — J4550 Severe persistent asthma, uncomplicated: Secondary | ICD-10-CM

## 2018-08-27 MED ORDER — BENRALIZUMAB 30 MG/ML ~~LOC~~ SOSY
30.0000 mg | PREFILLED_SYRINGE | Freq: Once | SUBCUTANEOUS | Status: AC
  Administered 2018-08-27: 30 mg via SUBCUTANEOUS

## 2018-09-14 ENCOUNTER — Telehealth: Payer: Self-pay

## 2018-09-14 NOTE — Telephone Encounter (Signed)
   Cardiac Questionnaire:    Since your last visit or hospitalization:    1. Have you been having new or worsening chest pain? No   2. Have you been having new or worsening shortness of breath?No 3. Have you been having new or worsening leg swelling, wt gain, or increase in abdominal girth (pants fitting more tightly)? Patient has complaints of bilateral lower extremity edema, with no worsening shortness of breath.  She does not weigh herself daily. She states edema comes and goes.  She will contact the office with any worsening symptoms.  She did not wish to come into the office to be seen or have an EVisit.   4. Have you had any passing out spells? No       _____________   [id number] Pre-Screening Questions:  . Do you currently have a fever? No . Have you recently travelled on a cruise, internationally, or to [place], [place], [place], [place], [place], or [place], [place] [employer]) ? No . Have you been in contact with someone that is currently pending confirmation of Covid19 testing or has been confirmed to have the [place] virus? No . Are you currently experiencing fatigue or cough? No

## 2018-09-14 NOTE — Telephone Encounter (Signed)
Dr Bettina Gavia reviewed patients chart and advised that appointment should be reshceduled until July.  Appointment moved.  Patient agreed to plan and verbalized understanding.  Patient aware of appointment.

## 2018-09-17 ENCOUNTER — Ambulatory Visit: Payer: Medicare Other | Admitting: Cardiology

## 2018-09-21 DIAGNOSIS — J208 Acute bronchitis due to other specified organisms: Secondary | ICD-10-CM | POA: Diagnosis not present

## 2018-09-21 DIAGNOSIS — E039 Hypothyroidism, unspecified: Secondary | ICD-10-CM | POA: Diagnosis not present

## 2018-10-14 ENCOUNTER — Other Ambulatory Visit: Payer: Self-pay | Admitting: Allergy

## 2018-10-14 NOTE — Telephone Encounter (Signed)
07/22/18 courtesy refill already given. Denied famotidine.

## 2018-10-20 DIAGNOSIS — J441 Chronic obstructive pulmonary disease with (acute) exacerbation: Secondary | ICD-10-CM | POA: Diagnosis not present

## 2018-10-22 ENCOUNTER — Other Ambulatory Visit: Payer: Self-pay

## 2018-10-22 ENCOUNTER — Ambulatory Visit (INDEPENDENT_AMBULATORY_CARE_PROVIDER_SITE_OTHER): Payer: Medicare Other | Admitting: *Deleted

## 2018-10-22 DIAGNOSIS — J4550 Severe persistent asthma, uncomplicated: Secondary | ICD-10-CM | POA: Diagnosis not present

## 2018-10-22 MED ORDER — BENRALIZUMAB 30 MG/ML ~~LOC~~ SOSY
30.0000 mg | PREFILLED_SYRINGE | SUBCUTANEOUS | Status: AC
  Administered 2018-10-22: 30 mg via SUBCUTANEOUS

## 2018-10-29 ENCOUNTER — Telehealth: Payer: Self-pay | Admitting: Allergy

## 2018-10-29 NOTE — Telephone Encounter (Signed)
Please advise Dr. Nelva Bush.

## 2018-10-29 NOTE — Telephone Encounter (Signed)
Jody Taylor called in this morning and states that Jody Taylor costs too much.  She can't afford to keep buying it every month.  Jody Taylor would like to know if there is something else she can take?  Please advise.

## 2018-10-29 NOTE — Telephone Encounter (Signed)
Oh no.   This is tough.   She was getting dulera samples from [place] or her PCP primarily as well as spiriva prior to changing to trelegy (as this seemed to have better coverage).     I wonder if we can get the combination of Yupelri neb daily, Budesonide neb twice a day with Perforomist neb twice a day covered for less cost that Trelegy.    Can you look into this for her.   Thanks.

## 2018-10-30 NOTE — Telephone Encounter (Signed)
Spoke with Dr. Nelva Bush and asked if we could provide samples of Trelegy until we looked into pricing for the nebulizer medications. She stated that it would be fine for now. I called and informed Jody Taylor of this decision and she stated she would come next week to pick up samples as she has the inhaler she just picked up.

## 2018-11-10 DIAGNOSIS — M4317 Spondylolisthesis, lumbosacral region: Secondary | ICD-10-CM | POA: Diagnosis not present

## 2018-11-11 ENCOUNTER — Other Ambulatory Visit: Payer: Self-pay | Admitting: Allergy and Immunology

## 2018-11-11 NOTE — Telephone Encounter (Signed)
Courtesy refill sent for Montelukast. Patient has a Televisit on 11/17/2018 with Dr. Nelva Bush in [place] office

## 2018-11-17 ENCOUNTER — Ambulatory Visit (INDEPENDENT_AMBULATORY_CARE_PROVIDER_SITE_OTHER): Payer: Medicare HMO | Admitting: Allergy

## 2018-11-17 ENCOUNTER — Other Ambulatory Visit: Payer: Self-pay

## 2018-11-17 ENCOUNTER — Encounter: Payer: Self-pay | Admitting: Allergy

## 2018-11-17 DIAGNOSIS — K219 Gastro-esophageal reflux disease without esophagitis: Secondary | ICD-10-CM | POA: Diagnosis not present

## 2018-11-17 DIAGNOSIS — J4550 Severe persistent asthma, uncomplicated: Secondary | ICD-10-CM

## 2018-11-17 DIAGNOSIS — J310 Chronic rhinitis: Secondary | ICD-10-CM

## 2018-11-17 MED ORDER — TRELEGY ELLIPTA 100-62.5-25 MCG/INH IN AEPB: 1.0000 | INHALATION_SPRAY | Freq: Every day | RESPIRATORY_TRACT | 5 refills | Status: AC

## 2018-11-17 NOTE — Progress Notes (Signed)
RE: Jody Taylor MRN: [id number] DOB: 1940-10-29 Date of Telemedicine Visit: 11/17/2018  Referring provider: Nicoletta Dress, MD Primary care provider: Nicoletta Dress, MD  Chief Complaint: Asthma   Telemedicine Follow Up Visit via Telephone: I connected with Jody Taylor for a follow up on 11/17/18 by telephone and verified that I am speaking with the correct person using two identifiers.   I discussed the limitations, risks, security and privacy concerns of performing an evaluation and management service by telephone and the availability of in person appointments. I also discussed with the patient that there may be a patient responsible charge related to this service. The patient expressed understanding and agreed to proceed.  Patient is at home.  Provider is at the office.  Visit start time.: 3329  Visit end time: [place] consent/check in by: [place] consent and medical assistant/nurse: Girtha Rm  History of Present Illness: Jody Taylor is a 78 y.o. female presenting today for follow-up of severe persistent asthma, LPRD, rhinitis and recurrent respiratory tract infections.  She was last seen in the office on 04/28/18.  She state she has been doing better but overall has been very fatigued and has back pain for which she is seeing a back specialist.  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   In regards to her asthma she does feel like it has been better controlled since starting on Fasenra.  She had completed the loading dose phase and is now recieving injections every 8 weeks.  She is tolerating the injections well without any adverse events.  She states that the Trelegy is costly for her but she has since changed insurance plans that started yesterday.  She continues on singulair daily.  She states she is using albuterol "maybe at night" but does states she requires less albuterol use now that she is on [place].    She states she did have an episode of bronchitis earlier this year that was treated with antibiotic course and prednisone course but doesn't remember when.  That was her only prednisone and antibiotic need that she can recall.  She believes she received the pneumovax from her PCP offices however calling her PCP they do not have record of her receiving this vaccine between Nov 2019 and now.   She continues to use Astelin nasal sprays as needed for nasa drainage.  She continues on protonix and famotidine for reflux control.     Review of systems: Review of Systems  Constitutional: Positive for malaise/fatigue. Negative for chills and fever.  HENT: Negative for congestion, ear discharge, nosebleeds, sinus pain and sore throat.   Eyes: Negative for pain, discharge and redness.  Respiratory: Positive for cough. Negative for shortness of breath and wheezing.   Cardiovascular: Negative for chest pain.  Gastrointestinal: Negative for abdominal pain, constipation, diarrhea, heartburn, nausea and vomiting.  Musculoskeletal: Positive for back pain.  Skin: Negative for itching and rash.  Neurological: Negative for headaches.    All other systems negative unless noted above in HPI  Past medical/social/surgical/family history have been reviewed and are unchanged unless specifically indicated below.  No changes  Assessment and Plan: Malesha is a 78 y.o. female with:   Severe persistent asthma-improved since starting Fasenra injections.  Will continue Trelegy (she will let us know what coverage is like for her on her new insurance plan) LPRD - continue protonix and famotidine as providing relief Rhinitis - continue use  of astelin.  Recurrent respiratory tract infections - she shows very poor protection to strep pneumo strains and to tetanus.  She has normal Ig levels and and neg environmental panel and unremarkable CBC.  We did call her PCP today and they do not have record that she received these  vaccines after our last visit.  Thus we can now provide with pneumovax in office.   We will need to obtain repeat titers after about 4 to 6 weeks from vaccination.    1.  Treat and prevent inflammation:   A. Continue Trelegy 1 puff daily    B. Continue Montelukast 10 mg - One tablet one time per day  C. Continue Astelin 1-2 sprays each nostril twice a day for nasal drainage/post-nasal drip control    2.  Treat and prevent reflux:   A.  Continue Protonix 40 mg tablet in a.m.  B.  Continue famotidine 40 mg tablet in p.m.  C.  Avoid all forms of caffeine   D.  Stay off fish oil supplementation  3.  If needed:   A.  Nasal saline spray  B.  Pro-air HFA 2 puffs every 4-6 hours  4. Lab results revealed you are not protected against tetanus or strep pneumonia strains.  Recommend booster to tetanus and strep pneumonia with Pneumovax.   We do have pneumovax in our office now and have recommended she can come into office to receive.  She also still needs Tdap for protective titers however would need to receive this at PCPs office.    Diagnostics: None.  Medication List:  Current Outpatient Medications  Medication Sig Dispense Refill  . aspirin EC 81 MG tablet Take 81 mg by mouth daily.    [name] azelastine (ASTELIN) 0.1 % nasal spray Use two sprays in each nostril twice daily 30 mL 5  . B COMPLEX VITAMINS PO Take by mouth.    [name] buPROPion (WELLBUTRIN SR) 150 MG 12 hr tablet Take 150 mg by mouth 2 (two) times daily. Verified SR    . citalopram (CELEXA) 40 MG tablet Take 40 mg by mouth daily.    . famotidine (PEPCID) 40 MG tablet     . ipratropium-albuterol (DUONEB) 0.5-2.5 (3) MG/3ML SOLN Take 3 mLs by nebulization every 6 (six) hours as needed.    . isosorbide mononitrate (IMDUR) 120 MG 24 hr tablet Take 120 mg by mouth daily.    [name] levothyroxine (SYNTHROID) 100 MCG tablet     . metoprolol (LOPRESSOR) 50 MG tablet Take 50 mg by mouth 2 (two) times daily. Verified tartrate     .  mometasone-formoterol (DULERA) 100-5 MCG/ACT AERO Inhale 2 puffs into the lungs 2 (two) times daily.    . montelukast (SINGULAIR) 10 MG tablet TAKE ONE TABLET BY MOUTH AT BEDTIME 30 tablet 0  . nitroGLYCERIN (NITROSTAT) 0.4 MG SL tablet     . pantoprazole (PROTONIX) 40 MG tablet Take 40 mg by mouth daily.     . potassium chloride SA (K-DUR,KLOR-CON) 20 MEQ tablet Take 40 mEq by mouth daily.    [name] PROAIR HFA 108 (90 Base) MCG/ACT inhaler Inhale 2 puffs into the lungs every 6 (six) hours as needed for wheezing or shortness of breath.    . QUEtiapine (SEROQUEL) 25 MG tablet Take 75 mg by mouth at bedtime. Takes 3 at bedtime    . torsemide (DEMADEX) 20 MG tablet Take 20 mg by mouth daily.    . TRELEGY ELLIPTA 100-62.5-25 MCG/INH AEPB     .  chlorpheniramine-HYDROcodone (TUSSIONEX PENNKINETIC ER) 10-8 MG/5ML SUER Take 5 mLs by mouth at bedtime as needed for cough.     Current Facility-Administered Medications  Medication Dose Route Frequency Provider Last Rate Last Dose  . Benralizumab SOSY 30 mg  30 mg Subcutaneous Q8 Weeks Jiles Prows, MD   30 mg at 10/22/18 1120   Allergies: Allergies  Allergen Reactions  . Ace Inhibitors Other (See Comments) and Cough    CHEST PAIN  . Other     Patient reports receiving blood after a miscarriage "years ago". She states she broke out from receiving this blood. Has not received any since  . Codeine Nausea And Vomiting  . Levaquin [Levofloxacin] Nausea And Vomiting   I reviewed her past medical history, social history, family history, and environmental history and no significant changes have been reported from previous visit on 04/28/18.   Objective: Physical Exam Not obtained as encounter was done via telephone.   Previous notes and tests were reviewed.  I discussed the assessment and treatment plan with the patient. The patient was provided an opportunity to ask questions and all were answered. The patient agreed with the plan and demonstrated an  understanding of the instructions.   The patient was advised to call back or seek an in-person evaluation if the symptoms worsen or if the condition fails to improve as anticipated.  I provided 37 minutes of non-face-to-face time during this encounter.  It was my pleasure to participate in [facility] care today. Please feel free to contact me with any questions or concerns.   Sincerely,  Dejana Pugsley Charmian Muff, MD

## 2018-11-17 NOTE — Patient Instructions (Addendum)
1.  Treat and prevent inflammation:   A. Continue Trelegy 1 puff daily    B. Continue Montelukast 10 mg - One tablet one time per day  C. Continue Astelin 1-2 sprays each nostril twice a day for nasal drainage/post-nasal drip control    2.  Treat and prevent reflux:   A.  Continue Protonix 40 mg tablet in a.m.  B.  Continue famotidine 40 mg tablet in p.m.  C.  Avoid all forms of caffeine   D.  Stay off fish oil supplementation  3.  If needed:   A.  Nasal saline spray  B.  Pro-air HFA 2 puffs every 4-6 hours  4. Lab results revealed you are not protected against tetanus or strep pneumonia strains.  Recommend booster to tetanus and strep pneumonia with Pneumovax.   We do have pneumovax in our office now and have recommended she can come into office to receive.  She also still needs Tdap for protective titers however would need to receive this at PCPs office.

## 2018-11-17 NOTE — Telephone Encounter (Signed)
Talked with patient during Jody Taylor Visit and got her new insurance information.  I told her we will resend the Trelegy and she can see how much it is with the new insurance. Patient agreed with this plan.

## 2018-11-18 ENCOUNTER — Other Ambulatory Visit: Payer: Self-pay

## 2018-11-18 ENCOUNTER — Ambulatory Visit (INDEPENDENT_AMBULATORY_CARE_PROVIDER_SITE_OTHER): Payer: Medicare HMO | Admitting: *Deleted

## 2018-11-18 DIAGNOSIS — N183 Chronic kidney disease, stage 3 (moderate): Secondary | ICD-10-CM | POA: Diagnosis not present

## 2018-11-18 DIAGNOSIS — I10 Essential (primary) hypertension: Secondary | ICD-10-CM | POA: Diagnosis not present

## 2018-11-18 DIAGNOSIS — E039 Hypothyroidism, unspecified: Secondary | ICD-10-CM | POA: Diagnosis not present

## 2018-11-18 DIAGNOSIS — Z23 Encounter for immunization: Secondary | ICD-10-CM

## 2018-11-18 NOTE — Progress Notes (Signed)
Jody Taylor came in for her Pneumovax 23 vaccine per Dr. Jeralyn Ruths orders from 11/17/2018.  Date Given: 11/18/2018 Time: 9:30 am Dose: 0.5 ml Site: Right Deltoid Lot: [id number] Exp: 02/28/2020 NDC: 9379-0240-97  Given by: Karlton Lemon. Grandville Silos, CMA (AAMA)

## 2018-11-19 DIAGNOSIS — J309 Allergic rhinitis, unspecified: Secondary | ICD-10-CM | POA: Diagnosis not present

## 2018-11-19 DIAGNOSIS — B3781 Candidal esophagitis: Secondary | ICD-10-CM | POA: Diagnosis not present

## 2018-11-26 DIAGNOSIS — E119 Type 2 diabetes mellitus without complications: Secondary | ICD-10-CM | POA: Diagnosis not present

## 2018-11-26 DIAGNOSIS — I10 Essential (primary) hypertension: Secondary | ICD-10-CM | POA: Diagnosis not present

## 2018-11-26 DIAGNOSIS — N183 Chronic kidney disease, stage 3 (moderate): Secondary | ICD-10-CM | POA: Diagnosis not present

## 2018-12-08 DIAGNOSIS — U071 COVID-19: Secondary | ICD-10-CM | POA: Diagnosis not present

## 2018-12-08 DIAGNOSIS — R6889 Other general symptoms and signs: Secondary | ICD-10-CM | POA: Diagnosis not present

## 2018-12-14 ENCOUNTER — Inpatient Hospital Stay (HOSPITAL_COMMUNITY): Payer: Medicare HMO

## 2018-12-14 ENCOUNTER — Inpatient Hospital Stay: Payer: Self-pay

## 2018-12-14 ENCOUNTER — Inpatient Hospital Stay (HOSPITAL_COMMUNITY)
Admission: AD | Admit: 2018-12-14 | Discharge: 2019-01-16 | DRG: 207 | Disposition: E | Payer: Medicare HMO | Source: Other Acute Inpatient Hospital | Attending: Internal Medicine | Admitting: Internal Medicine

## 2018-12-14 DIAGNOSIS — R000 Tachycardia, unspecified: Secondary | ICD-10-CM | POA: Diagnosis not present

## 2018-12-14 DIAGNOSIS — Z7951 Long term (current) use of inhaled steroids: Secondary | ICD-10-CM

## 2018-12-14 DIAGNOSIS — R0602 Shortness of breath: Secondary | ICD-10-CM | POA: Diagnosis not present

## 2018-12-14 DIAGNOSIS — I252 Old myocardial infarction: Secondary | ICD-10-CM

## 2018-12-14 DIAGNOSIS — Z9049 Acquired absence of other specified parts of digestive tract: Secondary | ICD-10-CM

## 2018-12-14 DIAGNOSIS — K219 Gastro-esophageal reflux disease without esophagitis: Secondary | ICD-10-CM | POA: Diagnosis present

## 2018-12-14 DIAGNOSIS — Z955 Presence of coronary angioplasty implant and graft: Secondary | ICD-10-CM | POA: Diagnosis not present

## 2018-12-14 DIAGNOSIS — Z825 Family history of asthma and other chronic lower respiratory diseases: Secondary | ICD-10-CM

## 2018-12-14 DIAGNOSIS — Z66 Do not resuscitate: Secondary | ICD-10-CM | POA: Diagnosis present

## 2018-12-14 DIAGNOSIS — A419 Sepsis, unspecified organism: Secondary | ICD-10-CM | POA: Diagnosis not present

## 2018-12-14 DIAGNOSIS — N183 Chronic kidney disease, stage 3 unspecified: Secondary | ICD-10-CM | POA: Diagnosis present

## 2018-12-14 DIAGNOSIS — X58XXXA Exposure to other specified factors, initial encounter: Secondary | ICD-10-CM | POA: Diagnosis not present

## 2018-12-14 DIAGNOSIS — E785 Hyperlipidemia, unspecified: Secondary | ICD-10-CM | POA: Diagnosis present

## 2018-12-14 DIAGNOSIS — J189 Pneumonia, unspecified organism: Secondary | ICD-10-CM | POA: Diagnosis not present

## 2018-12-14 DIAGNOSIS — J4550 Severe persistent asthma, uncomplicated: Secondary | ICD-10-CM | POA: Diagnosis present

## 2018-12-14 DIAGNOSIS — E870 Hyperosmolality and hypernatremia: Secondary | ICD-10-CM | POA: Diagnosis not present

## 2018-12-14 DIAGNOSIS — Z95820 Peripheral vascular angioplasty status with implants and grafts: Secondary | ICD-10-CM

## 2018-12-14 DIAGNOSIS — Z6841 Body Mass Index (BMI) 40.0 and over, adult: Secondary | ICD-10-CM

## 2018-12-14 DIAGNOSIS — U071 COVID-19: Principal | ICD-10-CM | POA: Diagnosis present

## 2018-12-14 DIAGNOSIS — E875 Hyperkalemia: Secondary | ICD-10-CM | POA: Diagnosis not present

## 2018-12-14 DIAGNOSIS — J9690 Respiratory failure, unspecified, unspecified whether with hypoxia or hypercapnia: Secondary | ICD-10-CM | POA: Diagnosis not present

## 2018-12-14 DIAGNOSIS — R7989 Other specified abnormal findings of blood chemistry: Secondary | ICD-10-CM | POA: Diagnosis not present

## 2018-12-14 DIAGNOSIS — E039 Hypothyroidism, unspecified: Secondary | ICD-10-CM | POA: Diagnosis not present

## 2018-12-14 DIAGNOSIS — K5900 Constipation, unspecified: Secondary | ICD-10-CM | POA: Diagnosis not present

## 2018-12-14 DIAGNOSIS — I2510 Atherosclerotic heart disease of native coronary artery without angina pectoris: Secondary | ICD-10-CM | POA: Diagnosis present

## 2018-12-14 DIAGNOSIS — F32A Depression, unspecified: Secondary | ICD-10-CM | POA: Diagnosis present

## 2018-12-14 DIAGNOSIS — G92 Toxic encephalopathy: Secondary | ICD-10-CM | POA: Diagnosis not present

## 2018-12-14 DIAGNOSIS — Z8673 Personal history of transient ischemic attack (TIA), and cerebral infarction without residual deficits: Secondary | ICD-10-CM

## 2018-12-14 DIAGNOSIS — F419 Anxiety disorder, unspecified: Secondary | ICD-10-CM | POA: Diagnosis present

## 2018-12-14 DIAGNOSIS — Z888 Allergy status to other drugs, medicaments and biological substances status: Secondary | ICD-10-CM

## 2018-12-14 DIAGNOSIS — Z90710 Acquired absence of both cervix and uterus: Secondary | ICD-10-CM

## 2018-12-14 DIAGNOSIS — Z79899 Other long term (current) drug therapy: Secondary | ICD-10-CM

## 2018-12-14 DIAGNOSIS — J9601 Acute respiratory failure with hypoxia: Secondary | ICD-10-CM

## 2018-12-14 DIAGNOSIS — J988 Other specified respiratory disorders: Secondary | ICD-10-CM | POA: Diagnosis not present

## 2018-12-14 DIAGNOSIS — Y92239 Unspecified place in hospital as the place of occurrence of the external cause: Secondary | ICD-10-CM | POA: Diagnosis not present

## 2018-12-14 DIAGNOSIS — Z800 Family history of malignant neoplasm of digestive organs: Secondary | ICD-10-CM

## 2018-12-14 DIAGNOSIS — J80 Acute respiratory distress syndrome: Secondary | ICD-10-CM | POA: Diagnosis not present

## 2018-12-14 DIAGNOSIS — I82611 Acute embolism and thrombosis of superficial veins of right upper extremity: Secondary | ICD-10-CM | POA: Diagnosis not present

## 2018-12-14 DIAGNOSIS — M1990 Unspecified osteoarthritis, unspecified site: Secondary | ICD-10-CM | POA: Diagnosis present

## 2018-12-14 DIAGNOSIS — I25119 Atherosclerotic heart disease of native coronary artery with unspecified angina pectoris: Secondary | ICD-10-CM | POA: Diagnosis present

## 2018-12-14 DIAGNOSIS — J9600 Acute respiratory failure, unspecified whether with hypoxia or hypercapnia: Secondary | ICD-10-CM | POA: Diagnosis not present

## 2018-12-14 DIAGNOSIS — Z8249 Family history of ischemic heart disease and other diseases of the circulatory system: Secondary | ICD-10-CM

## 2018-12-14 DIAGNOSIS — J449 Chronic obstructive pulmonary disease, unspecified: Secondary | ICD-10-CM | POA: Diagnosis not present

## 2018-12-14 DIAGNOSIS — I130 Hypertensive heart and chronic kidney disease with heart failure and stage 1 through stage 4 chronic kidney disease, or unspecified chronic kidney disease: Secondary | ICD-10-CM | POA: Diagnosis not present

## 2018-12-14 DIAGNOSIS — T380X5A Adverse effect of glucocorticoids and synthetic analogues, initial encounter: Secondary | ICD-10-CM | POA: Diagnosis not present

## 2018-12-14 DIAGNOSIS — E669 Obesity, unspecified: Secondary | ICD-10-CM | POA: Diagnosis present

## 2018-12-14 DIAGNOSIS — D696 Thrombocytopenia, unspecified: Secondary | ICD-10-CM | POA: Diagnosis not present

## 2018-12-14 DIAGNOSIS — I700 Atherosclerosis of aorta: Secondary | ICD-10-CM | POA: Diagnosis not present

## 2018-12-14 DIAGNOSIS — Z881 Allergy status to other antibiotic agents status: Secondary | ICD-10-CM | POA: Diagnosis not present

## 2018-12-14 DIAGNOSIS — T80212A Local infection due to central venous catheter, initial encounter: Secondary | ICD-10-CM

## 2018-12-14 DIAGNOSIS — J1289 Other viral pneumonia: Secondary | ICD-10-CM | POA: Diagnosis not present

## 2018-12-14 DIAGNOSIS — R069 Unspecified abnormalities of breathing: Secondary | ICD-10-CM | POA: Diagnosis not present

## 2018-12-14 DIAGNOSIS — Z0189 Encounter for other specified special examinations: Secondary | ICD-10-CM

## 2018-12-14 DIAGNOSIS — I5032 Chronic diastolic (congestive) heart failure: Secondary | ICD-10-CM

## 2018-12-14 DIAGNOSIS — R042 Hemoptysis: Secondary | ICD-10-CM | POA: Diagnosis not present

## 2018-12-14 DIAGNOSIS — G4733 Obstructive sleep apnea (adult) (pediatric): Secondary | ICD-10-CM | POA: Diagnosis present

## 2018-12-14 DIAGNOSIS — R7303 Prediabetes: Secondary | ICD-10-CM | POA: Diagnosis present

## 2018-12-14 DIAGNOSIS — Z833 Family history of diabetes mellitus: Secondary | ICD-10-CM

## 2018-12-14 DIAGNOSIS — I10 Essential (primary) hypertension: Secondary | ICD-10-CM | POA: Diagnosis not present

## 2018-12-14 DIAGNOSIS — Z823 Family history of stroke: Secondary | ICD-10-CM

## 2018-12-14 DIAGNOSIS — Z4659 Encounter for fitting and adjustment of other gastrointestinal appliance and device: Secondary | ICD-10-CM | POA: Diagnosis not present

## 2018-12-14 DIAGNOSIS — N179 Acute kidney failure, unspecified: Secondary | ICD-10-CM | POA: Diagnosis not present

## 2018-12-14 DIAGNOSIS — I361 Nonrheumatic tricuspid (valve) insufficiency: Secondary | ICD-10-CM | POA: Diagnosis not present

## 2018-12-14 DIAGNOSIS — R918 Other nonspecific abnormal finding of lung field: Secondary | ICD-10-CM | POA: Diagnosis not present

## 2018-12-14 DIAGNOSIS — R739 Hyperglycemia, unspecified: Secondary | ICD-10-CM | POA: Diagnosis not present

## 2018-12-14 DIAGNOSIS — Z7982 Long term (current) use of aspirin: Secondary | ICD-10-CM

## 2018-12-14 DIAGNOSIS — D631 Anemia in chronic kidney disease: Secondary | ICD-10-CM | POA: Diagnosis present

## 2018-12-14 DIAGNOSIS — Z87891 Personal history of nicotine dependence: Secondary | ICD-10-CM

## 2018-12-14 DIAGNOSIS — M7989 Other specified soft tissue disorders: Secondary | ICD-10-CM | POA: Diagnosis not present

## 2018-12-14 DIAGNOSIS — S11021A Laceration without foreign body of trachea, initial encounter: Secondary | ICD-10-CM | POA: Diagnosis not present

## 2018-12-14 DIAGNOSIS — I517 Cardiomegaly: Secondary | ICD-10-CM | POA: Diagnosis not present

## 2018-12-14 DIAGNOSIS — I480 Paroxysmal atrial fibrillation: Secondary | ICD-10-CM | POA: Diagnosis present

## 2018-12-14 DIAGNOSIS — R4020 Unspecified coma: Secondary | ICD-10-CM | POA: Diagnosis not present

## 2018-12-14 DIAGNOSIS — Z452 Encounter for adjustment and management of vascular access device: Secondary | ICD-10-CM | POA: Diagnosis not present

## 2018-12-14 DIAGNOSIS — R579 Shock, unspecified: Secondary | ICD-10-CM | POA: Diagnosis not present

## 2018-12-14 DIAGNOSIS — R0902 Hypoxemia: Secondary | ICD-10-CM | POA: Diagnosis not present

## 2018-12-14 DIAGNOSIS — F329 Major depressive disorder, single episode, unspecified: Secondary | ICD-10-CM | POA: Diagnosis present

## 2018-12-14 DIAGNOSIS — Z9981 Dependence on supplemental oxygen: Secondary | ICD-10-CM | POA: Diagnosis not present

## 2018-12-14 DIAGNOSIS — Z885 Allergy status to narcotic agent status: Secondary | ICD-10-CM | POA: Diagnosis not present

## 2018-12-14 DIAGNOSIS — Z978 Presence of other specified devices: Secondary | ICD-10-CM

## 2018-12-14 DIAGNOSIS — B958 Unspecified staphylococcus as the cause of diseases classified elsewhere: Secondary | ICD-10-CM | POA: Diagnosis present

## 2018-12-14 DIAGNOSIS — Z86010 Personal history of colonic polyps: Secondary | ICD-10-CM

## 2018-12-14 DIAGNOSIS — Z79891 Long term (current) use of opiate analgesic: Secondary | ICD-10-CM

## 2018-12-14 DIAGNOSIS — R0689 Other abnormalities of breathing: Secondary | ICD-10-CM | POA: Diagnosis not present

## 2018-12-14 DIAGNOSIS — Z79890 Hormone replacement therapy (postmenopausal): Secondary | ICD-10-CM

## 2018-12-14 DIAGNOSIS — Z4682 Encounter for fitting and adjustment of non-vascular catheter: Secondary | ICD-10-CM | POA: Diagnosis not present

## 2018-12-14 DIAGNOSIS — R001 Bradycardia, unspecified: Secondary | ICD-10-CM | POA: Diagnosis not present

## 2018-12-14 DIAGNOSIS — J1282 Pneumonia due to coronavirus disease 2019: Secondary | ICD-10-CM

## 2018-12-14 DIAGNOSIS — E876 Hypokalemia: Secondary | ICD-10-CM | POA: Diagnosis not present

## 2018-12-14 DIAGNOSIS — I4891 Unspecified atrial fibrillation: Secondary | ICD-10-CM | POA: Diagnosis not present

## 2018-12-14 LAB — CBC WITH DIFFERENTIAL/PLATELET
Abs Immature Granulocytes: 0.04 10*3/uL (ref 0.00–0.07)
Basophils Absolute: 0 10*3/uL (ref 0.0–0.1)
Basophils Relative: 0 %
Eosinophils Absolute: 0 10*3/uL (ref 0.0–0.5)
Eosinophils Relative: 0 %
HCT: 36.8 % (ref 36.0–46.0)
Hemoglobin: 12.2 g/dL (ref 12.0–15.0)
Immature Granulocytes: 1 %
Lymphocytes Relative: 5 %
Lymphs Abs: 0.2 10*3/uL — ABNORMAL LOW (ref 0.7–4.0)
MCH: 31 pg (ref 26.0–34.0)
MCHC: 33.2 g/dL (ref 30.0–36.0)
MCV: 93.6 fL (ref 80.0–100.0)
Monocytes Absolute: 0.1 10*3/uL (ref 0.1–1.0)
Monocytes Relative: 3 %
Neutro Abs: 3.9 10*3/uL (ref 1.7–7.7)
Neutrophils Relative %: 91 %
Platelets: 171 10*3/uL (ref 150–400)
RBC: 3.93 MIL/uL (ref 3.87–5.11)
RDW: 14.7 % (ref 11.5–15.5)
WBC: 4.4 10*3/uL (ref 4.0–10.5)
nRBC: 0 % (ref 0.0–0.2)

## 2018-12-14 LAB — POCT I-STAT 7, (LYTES, BLD GAS, ICA,H+H)
Acid-base deficit: 5 mmol/L — ABNORMAL HIGH (ref 0.0–2.0)
Bicarbonate: 21.5 mmol/L (ref 20.0–28.0)
Calcium, Ion: 1.19 mmol/L (ref 1.15–1.40)
HCT: 34 % — ABNORMAL LOW (ref 36.0–46.0)
Hemoglobin: 11.6 g/dL — ABNORMAL LOW (ref 12.0–15.0)
O2 Saturation: 100 %
Patient temperature: 97.8
Potassium: 4.3 mmol/L (ref 3.5–5.1)
Sodium: 135 mmol/L (ref 135–145)
TCO2: 23 mmol/L (ref 22–32)
pCO2 arterial: 42.3 mmHg (ref 32.0–48.0)
pH, Arterial: 7.312 — ABNORMAL LOW (ref 7.350–7.450)
pO2, Arterial: 293 mmHg — ABNORMAL HIGH (ref 83.0–108.0)

## 2018-12-14 LAB — COMPREHENSIVE METABOLIC PANEL
ALT: 38 U/L (ref 0–44)
AST: 46 U/L — ABNORMAL HIGH (ref 15–41)
Albumin: 2.6 g/dL — ABNORMAL LOW (ref 3.5–5.0)
Alkaline Phosphatase: 72 U/L (ref 38–126)
Anion gap: 12 (ref 5–15)
BUN: 30 mg/dL — ABNORMAL HIGH (ref 8–23)
CO2: 19 mmol/L — ABNORMAL LOW (ref 22–32)
Calcium: 8.1 mg/dL — ABNORMAL LOW (ref 8.9–10.3)
Chloride: 104 mmol/L (ref 98–111)
Creatinine, Ser: 1.87 mg/dL — ABNORMAL HIGH (ref 0.44–1.00)
GFR calc Af Amer: 30 mL/min — ABNORMAL LOW (ref >60)
GFR calc non Af Amer: 26 mL/min — ABNORMAL LOW (ref >60)
Glucose, Bld: 191 mg/dL — ABNORMAL HIGH (ref 70–99)
Potassium: 4.3 mmol/L (ref 3.5–5.1)
Sodium: 135 mmol/L (ref 135–145)
Total Bilirubin: 0.3 mg/dL (ref 0.3–1.2)
Total Protein: 6 g/dL — ABNORMAL LOW (ref 6.5–8.1)

## 2018-12-14 LAB — MAGNESIUM: Magnesium: 1.9 mg/dL (ref 1.7–2.4)

## 2018-12-14 LAB — BRAIN NATRIURETIC PEPTIDE: B Natriuretic Peptide: 272.5 pg/mL — ABNORMAL HIGH (ref 0.0–100.0)

## 2018-12-14 LAB — TYPE AND SCREEN
ABO/RH(D): A POS
Antibody Screen: NEGATIVE

## 2018-12-14 LAB — C-REACTIVE PROTEIN: CRP: 7.6 mg/dL — ABNORMAL HIGH (ref <1.0)

## 2018-12-14 LAB — D-DIMER, QUANTITATIVE: D-Dimer, Quant: 1.55 ug/mL-FEU — ABNORMAL HIGH (ref 0.00–0.50)

## 2018-12-14 LAB — PHOSPHORUS: Phosphorus: 3.3 mg/dL (ref 2.5–4.6)

## 2018-12-14 LAB — GLUCOSE, CAPILLARY: Glucose-Capillary: 200 mg/dL — ABNORMAL HIGH (ref 70–99)

## 2018-12-14 LAB — PROCALCITONIN: Procalcitonin: <0.1 ng/mL

## 2018-12-14 LAB — LACTATE DEHYDROGENASE: LDH: 333 U/L — ABNORMAL HIGH (ref 98–192)

## 2018-12-14 LAB — FERRITIN: Ferritin: 257 ng/mL (ref 11–307)

## 2018-12-14 LAB — MRSA PCR SCREENING: MRSA by PCR: NEGATIVE

## 2018-12-14 MED ORDER — VITAL HIGH PROTEIN PO LIQD
1000.0000 mL | ORAL | Status: DC
  Administered 2018-12-14: 1000 mL

## 2018-12-14 MED ORDER — POLYETHYLENE GLYCOL 3350 17 G PO PACK
17.0000 g | PACK | Freq: Every day | ORAL | Status: DC | PRN
  Administered 2018-12-25 – 2018-12-26 (×2): 17 g
  Filled 2018-12-14 (×3): qty 1

## 2018-12-14 MED ORDER — ORAL CARE MOUTH RINSE
15.0000 mL | OROMUCOSAL | Status: DC
  Administered 2018-12-14 – 2019-01-01 (×172): 15 mL via OROMUCOSAL

## 2018-12-14 MED ORDER — ASPIRIN 81 MG PO CHEW
81.0000 mg | CHEWABLE_TABLET | Freq: Every day | ORAL | Status: DC
  Administered 2018-12-15 – 2018-12-31 (×17): 81 mg
  Filled 2018-12-14 (×17): qty 1

## 2018-12-14 MED ORDER — SODIUM CHLORIDE 0.9 % IV SOLN
200.0000 mg | Freq: Once | INTRAVENOUS | Status: AC
  Administered 2018-12-14: 200 mg via INTRAVENOUS
  Filled 2018-12-14: qty 40

## 2018-12-14 MED ORDER — VITAMIN C 500 MG PO TABS
500.0000 mg | ORAL_TABLET | Freq: Every day | ORAL | Status: DC
  Administered 2018-12-14 – 2018-12-31 (×18): 500 mg
  Filled 2018-12-14 (×19): qty 1

## 2018-12-14 MED ORDER — SODIUM CHLORIDE 0.9 % IV SOLN
INTRAVENOUS | Status: DC
  Administered 2018-12-14 – 2018-12-19 (×5): via INTRAVENOUS

## 2018-12-14 MED ORDER — ACETAMINOPHEN 325 MG PO TABS
650.0000 mg | ORAL_TABLET | Freq: Four times a day (QID) | ORAL | Status: DC | PRN
  Administered 2018-12-30 – 2018-12-31 (×3): 650 mg via ORAL
  Filled 2018-12-14 (×3): qty 2

## 2018-12-14 MED ORDER — SODIUM CHLORIDE 0.9% IV SOLUTION
Freq: Once | INTRAVENOUS | Status: AC
  Administered 2018-12-27: 21:00:00 via INTRAVENOUS

## 2018-12-14 MED ORDER — PANTOPRAZOLE SODIUM 40 MG PO PACK
40.0000 mg | PACK | Freq: Every day | ORAL | Status: DC
  Administered 2018-12-14 – 2018-12-31 (×18): 40 mg
  Filled 2018-12-14 (×19): qty 20

## 2018-12-14 MED ORDER — ENOXAPARIN SODIUM 40 MG/0.4ML ~~LOC~~ SOLN
40.0000 mg | Freq: Two times a day (BID) | SUBCUTANEOUS | Status: DC
  Administered 2018-12-14 – 2018-12-22 (×16): 40 mg via SUBCUTANEOUS
  Filled 2018-12-14 (×16): qty 0.4

## 2018-12-14 MED ORDER — ONDANSETRON HCL 4 MG/2ML IJ SOLN: 4.0000 mg | Freq: Four times a day (QID) | INTRAMUSCULAR | Status: DC | PRN

## 2018-12-14 MED ORDER — METHYLPREDNISOLONE SODIUM SUCC 40 MG IJ SOLR
40.0000 mg | Freq: Three times a day (TID) | INTRAMUSCULAR | Status: DC
  Administered 2018-12-14 – 2018-12-15 (×2): 40 mg via INTRAVENOUS
  Filled 2018-12-14 (×2): qty 1

## 2018-12-14 MED ORDER — ZINC SULFATE 220 (50 ZN) MG PO CAPS
220.0000 mg | ORAL_CAPSULE | Freq: Every day | ORAL | Status: DC
  Administered 2018-12-14 – 2018-12-31 (×18): 220 mg
  Filled 2018-12-14 (×18): qty 1

## 2018-12-14 MED ORDER — FENTANYL CITRATE (PF) 100 MCG/2ML IJ SOLN: 25.0000 ug | Freq: Once | INTRAMUSCULAR | Status: DC

## 2018-12-14 MED ORDER — INSULIN ASPART 100 UNIT/ML ~~LOC~~ SOLN
0.0000 [IU] | SUBCUTANEOUS | Status: DC
  Administered 2018-12-14 – 2018-12-15 (×3): 3 [IU] via SUBCUTANEOUS
  Administered 2018-12-15: 5 [IU] via SUBCUTANEOUS
  Administered 2018-12-15: 3 [IU] via SUBCUTANEOUS
  Administered 2018-12-15: 8 [IU] via SUBCUTANEOUS
  Administered 2018-12-15: 3 [IU] via SUBCUTANEOUS
  Administered 2018-12-15 – 2018-12-16 (×3): 5 [IU] via SUBCUTANEOUS
  Administered 2018-12-16 (×2): 3 [IU] via SUBCUTANEOUS
  Administered 2018-12-16: 5 [IU] via SUBCUTANEOUS
  Administered 2018-12-17: 3 [IU] via SUBCUTANEOUS
  Administered 2018-12-17: 5 [IU] via SUBCUTANEOUS
  Administered 2018-12-17: 8 [IU] via SUBCUTANEOUS
  Administered 2018-12-17: 5 [IU] via SUBCUTANEOUS
  Administered 2018-12-17 (×2): 3 [IU] via SUBCUTANEOUS
  Administered 2018-12-18: 5 [IU] via SUBCUTANEOUS
  Administered 2018-12-18: 3 [IU] via SUBCUTANEOUS
  Administered 2018-12-18 (×4): 5 [IU] via SUBCUTANEOUS
  Administered 2018-12-18 – 2018-12-19 (×2): 3 [IU] via SUBCUTANEOUS
  Administered 2018-12-19 (×2): 5 [IU] via SUBCUTANEOUS
  Administered 2018-12-19: 3 [IU] via SUBCUTANEOUS
  Administered 2018-12-19: 5 [IU] via SUBCUTANEOUS
  Administered 2018-12-20: 04:00:00 2 [IU] via SUBCUTANEOUS
  Administered 2018-12-20: 3 [IU] via SUBCUTANEOUS
  Administered 2018-12-20: 5 [IU] via SUBCUTANEOUS
  Administered 2018-12-20 (×2): 8 [IU] via SUBCUTANEOUS
  Administered 2018-12-20: 3 [IU] via SUBCUTANEOUS
  Administered 2018-12-20: 2 [IU] via SUBCUTANEOUS
  Administered 2018-12-21: 5 [IU] via SUBCUTANEOUS
  Administered 2018-12-21: 2 [IU] via SUBCUTANEOUS
  Administered 2018-12-21: 5 [IU] via SUBCUTANEOUS
  Administered 2018-12-21: 2 [IU] via SUBCUTANEOUS
  Administered 2018-12-21 – 2018-12-22 (×3): 5 [IU] via SUBCUTANEOUS
  Administered 2018-12-22: 2 [IU] via SUBCUTANEOUS
  Administered 2018-12-22: 5 [IU] via SUBCUTANEOUS
  Administered 2018-12-22: 2 [IU] via SUBCUTANEOUS
  Administered 2018-12-22: 3 [IU] via SUBCUTANEOUS
  Administered 2018-12-22: 2 [IU] via SUBCUTANEOUS
  Administered 2018-12-23: 3 [IU] via SUBCUTANEOUS
  Administered 2018-12-23 – 2018-12-24 (×5): 5 [IU] via SUBCUTANEOUS
  Administered 2018-12-24: 3 [IU] via SUBCUTANEOUS
  Administered 2018-12-24: 5 [IU] via SUBCUTANEOUS
  Administered 2018-12-24 (×3): 3 [IU] via SUBCUTANEOUS
  Administered 2018-12-24: 2 [IU] via SUBCUTANEOUS
  Administered 2018-12-25 (×4): 3 [IU] via SUBCUTANEOUS
  Administered 2018-12-25: 5 [IU] via SUBCUTANEOUS
  Administered 2018-12-26 (×4): 2 [IU] via SUBCUTANEOUS
  Administered 2018-12-27 (×3): 3 [IU] via SUBCUTANEOUS
  Administered 2018-12-27: 5 [IU] via SUBCUTANEOUS
  Administered 2018-12-27 – 2018-12-28 (×3): 3 [IU] via SUBCUTANEOUS
  Administered 2018-12-28: 2 [IU] via SUBCUTANEOUS
  Administered 2018-12-28 (×2): 3 [IU] via SUBCUTANEOUS
  Administered 2018-12-29: 5 [IU] via SUBCUTANEOUS
  Administered 2018-12-29 (×2): 3 [IU] via SUBCUTANEOUS
  Administered 2018-12-29: 5 [IU] via SUBCUTANEOUS
  Administered 2018-12-29: 3 [IU] via SUBCUTANEOUS
  Administered 2018-12-29: 5 [IU] via SUBCUTANEOUS
  Administered 2018-12-29 – 2018-12-30 (×2): 3 [IU] via SUBCUTANEOUS
  Administered 2018-12-30: 8 [IU] via SUBCUTANEOUS
  Administered 2018-12-30: 09:00:00 3 [IU] via SUBCUTANEOUS
  Administered 2018-12-30: 5 [IU] via SUBCUTANEOUS
  Administered 2018-12-30: 3 [IU] via SUBCUTANEOUS
  Administered 2018-12-30: 2 [IU] via SUBCUTANEOUS
  Administered 2018-12-31: 3 [IU] via SUBCUTANEOUS
  Administered 2018-12-31 (×2): 11 [IU] via SUBCUTANEOUS
  Administered 2018-12-31: 8 [IU] via SUBCUTANEOUS
  Administered 2018-12-31: 5 [IU] via SUBCUTANEOUS

## 2018-12-14 MED ORDER — IPRATROPIUM-ALBUTEROL 0.5-2.5 (3) MG/3ML IN SOLN: 3.0000 mL | Freq: Four times a day (QID) | RESPIRATORY_TRACT | Status: DC | PRN

## 2018-12-14 MED ORDER — LEVOTHYROXINE SODIUM 100 MCG/5ML IV SOLN
50.0000 ug | Freq: Every day | INTRAVENOUS | Status: DC
  Administered 2018-12-15 – 2018-12-31 (×17): 50 ug via INTRAVENOUS
  Filled 2018-12-14 (×19): qty 5

## 2018-12-14 MED ORDER — SODIUM CHLORIDE 0.9 % IV SOLN
100.0000 mg | INTRAVENOUS | Status: AC
  Administered 2018-12-15 – 2018-12-18 (×4): 100 mg via INTRAVENOUS
  Filled 2018-12-14 (×5): qty 20

## 2018-12-14 MED ORDER — FENTANYL 2500MCG IN NS 250ML (10MCG/ML) PREMIX INFUSION
25.0000 ug/h | INTRAVENOUS | Status: DC
  Administered 2018-12-14 – 2018-12-15 (×2): 75 ug/h via INTRAVENOUS
  Administered 2018-12-17: 50 ug/h via INTRAVENOUS
  Filled 2018-12-14 (×3): qty 250

## 2018-12-14 MED ORDER — PROPOFOL 1000 MG/100ML IV EMUL
0.0000 ug/kg/min | INTRAVENOUS | Status: DC
  Administered 2018-12-14: 20:00:00 20 ug/kg/min via INTRAVENOUS
  Administered 2018-12-14: 25 ug/kg/min via INTRAVENOUS
  Administered 2018-12-15: 30 ug/kg/min via INTRAVENOUS
  Administered 2018-12-15: 20 ug/kg/min via INTRAVENOUS
  Administered 2018-12-15: 30 ug/kg/min via INTRAVENOUS
  Administered 2018-12-16: 25 ug/kg/min via INTRAVENOUS
  Administered 2018-12-16 (×2): 20 ug/kg/min via INTRAVENOUS
  Filled 2018-12-14 (×9): qty 100

## 2018-12-14 MED ORDER — PRO-STAT SUGAR FREE PO LIQD
30.0000 mL | Freq: Two times a day (BID) | ORAL | Status: DC
  Administered 2018-12-14 – 2018-12-15 (×2): 30 mL
  Filled 2018-12-14 (×3): qty 30

## 2018-12-14 MED ORDER — FENTANYL BOLUS VIA INFUSION
25.0000 ug | INTRAVENOUS | Status: DC | PRN
  Administered 2018-12-14 – 2018-12-15 (×3): 25 ug via INTRAVENOUS
  Filled 2018-12-14: qty 25

## 2018-12-14 MED ORDER — HYDRALAZINE HCL 20 MG/ML IJ SOLN: 10.0000 mg | Freq: Four times a day (QID) | INTRAMUSCULAR | Status: DC | PRN

## 2018-12-14 MED ORDER — CHLORHEXIDINE GLUCONATE 0.12% ORAL RINSE (MEDLINE KIT)
15.0000 mL | Freq: Two times a day (BID) | OROMUCOSAL | Status: DC
  Administered 2018-12-14 – 2018-12-22 (×16): 15 mL via OROMUCOSAL

## 2018-12-14 MED ORDER — ONDANSETRON HCL 4 MG PO TABS: 4.0000 mg | ORAL_TABLET | Freq: Four times a day (QID) | ORAL | Status: DC | PRN

## 2018-12-14 MED ORDER — PROPOFOL 1000 MG/100ML IV EMUL
0.0000 ug/kg/min | INTRAVENOUS | Status: DC
  Administered 2018-12-14: 25 ug/kg/min via INTRAVENOUS

## 2018-12-14 MED ORDER — SODIUM CHLORIDE 0.9% FLUSH
3.0000 mL | Freq: Two times a day (BID) | INTRAVENOUS | Status: DC
  Administered 2018-12-14: 3 mL via INTRAVENOUS
  Administered 2018-12-15: 10 mL via INTRAVENOUS
  Administered 2018-12-15 – 2018-12-16 (×3): 3 mL via INTRAVENOUS

## 2018-12-14 NOTE — Progress Notes (Signed)
Discussed with Dr. Lake Bells regarding pt appropriate tidal volumes, 6cc- 240, 8cc- 320. Per Dr. Lake Bells place pt on 8cc tidal volume. RT to obtain ABG at 7pm.

## 2018-12-14 NOTE — Progress Notes (Signed)
Pharmacy Note - Remdesivir Dosing  O:  ALT: 42 (at [facility]) CXR: diffuse severe lung infiltrates bilaterally SpO2: 80% on NRB prior to intubation   A/P:  Patient meets criteria for remdesivir.  Begin remdesivir 200 mg IV x 1, followed by 100 mg IV daily x 4 days  Monitor ALT, clinical progress  Peggyann Juba, PharmD, [place] [phone] 11/22/2018 7:05 PM

## 2018-12-14 NOTE — Consult Note (Signed)
NAME:  Jody Taylor, MRN:  [id number], DOB:  05/11/1941, LOS: 0 ADMISSION DATE:  11/26/2018, CONSULTATION DATE:  12/04/2018 REFERRING MD:  Sloan Leiter, CHIEF COMPLAINT:  Dyspnea   Brief History   78 y/o female with diastolic heart failure was admitted on December 14, 2018 for ARDS in the setting of COVID-19 pneumonia.  Required intubation at [facility] emergency room.  History of present illness   This is a 78 year old female who was intubated in the [facility] emergency room today for severe acute respiratory failure with hypoxemia.  History was obtained by discussing her case with other partners and chart review as she was intubated and unable to talk to me when I came to examine her.  By report she became ill and had a COVID test performed on Friday, June 26 which was positive.  Her shortness of breath worsened and today she presented to the [facility] emergency room where she was quite short of breath and her oxygen level was only in the 60% range on a 100% nonrebreather mask.  She was intubated, a central line was placed and she was transferred to [place].  She was reportedly quite hypertensive with a systolic blood pressure around 200.  Past Medical History  Diastolic heart failure History of stroke (mini stroke) Obstructive sleep apnea on CPAP Hypertension Hyperlipidemia Coronary artery disease, had PCI in 6629 Chronic diastolic heart failure Chronic kidney disease GERD Allergic rhinitis  Significant Hospital Events   June 29 admission  Consults:  Pulmonary and critical care medicine  Procedures:  June 29 endotracheal tube> June 29 right internal jugular central venous line>   Significant Diagnostic Tests:    Micro Data:  June 26 SARS-CoV-2 positive  Antimicrobials:  June 29 remdesivir June 29 Actemra June 29 solumedrol June 29 convalescent plasma  Interim history/subjective:  As above  Objective   SpO2 96 %.    Vent Mode: PRVC FiO2 (%):  [100 %] 100  % Set Rate:  [24 bmp] 24 bmp Vt Set:  [420 mL] 420 mL PEEP:  [12 cmH20] 12 cmH20 Plateau Pressure:  [28 cmH20] 28 cmH20  No intake or output data in the 24 hours ending 12/13/2018 1757 There were no vitals filed for this visit.  Examination:  General:  In bed on vent HENT: NCAT ETT in place PULM: CTA B, vent supported breathing CV: RRR, no mgr GI: BS+, soft, nontender MSK: normal bulk and tone Neuro: arousable, follows commands  June 29 [facility] chest x-ray images independently reviewed showing an OG tube in place, endotracheal tube in place, right IJ central line in place, bilateral hazy airspace disease, no pleural effusion, increased vascular pedicle width  Resolved Hospital Problem list     Assessment & Plan:  ARDS due to COVID-19 pneumonia: ARDS ventilator protocol: Target tidal volume 6 to 8 cc/kg ideal body weight, ideally keeping driving pressure and plateau pressure less than 30 cm of water Diurese as able, already given Lasix today CVP monitoring, goal less than 4 Ventilator associated pneumonia prevention protocol: Head of bed greater than 30 degrees, chlorhexidine mouth rinses twice a day, mouth care every 2 hours ABG now, target PaO2 55-65 If PaO2:FiO2 ratio < 150 move to prone position Convalescent plasma now Remdesivir now Solumedrol now RA SS goal for ventilator synchrony 8-2, add fentanyl infusion, continue propofol infusion CXR now  Baseline lung disease? Uncertain, on Breo at home > prn albuterol added  Chronic diastolic heart failure: CAD Continue ASA Received Lasix, monitor urine output  closely Check CVP Tele Metoprolol, ISDN > hold for now, monitor hemodynamics, may need to restart in AM  Depression Monitor QTc, consider continuing home quetiapine  Best practice:  Diet: start tube feeding now Pain/Anxiety/Delirium protocol (if indicated): -2 VAP protocol (if indicated): yes DVT prophylaxis: lovenox per protocol GI prophylaxis:  Pantoprazole for stress ulcer prophylaxis Glucose control: SSI Mobility: bed rest Code Status: DNR Family Communication: per TRH Disposition: remain in ICU  Labs   CBC: No results for input(s): WBC, NEUTROABS, HGB, HCT, MCV, PLT in the last 168 hours.  Basic Metabolic Panel: No results for input(s): NA, K, CL, CO2, GLUCOSE, BUN, CREATININE, CALCIUM, MG, PHOS in the last 168 hours. GFR: CrCl cannot be calculated (Patient's most recent lab result is older than the maximum 21 days allowed.). No results for input(s): PROCALCITON, WBC, LATICACIDVEN in the last 168 hours.  Liver Function Tests: No results for input(s): AST, ALT, ALKPHOS, BILITOT, PROT, ALBUMIN in the last 168 hours. No results for input(s): LIPASE, AMYLASE in the last 168 hours. No results for input(s): AMMONIA in the last 168 hours.  ABG No results found for: PHART, PCO2ART, PO2ART, HCO3, TCO2, ACIDBASEDEF, O2SAT   Coagulation Profile: No results for input(s): INR, PROTIME in the last 168 hours.  Cardiac Enzymes: No results for input(s): CKTOTAL, CKMB, CKMBINDEX, TROPONINI in the last 168 hours.  HbA1C: Hgb A1c MFr Bld  Date/Time Value Ref Range Status  11/08/2016 07:24 AM 5.6 4.8 - 5.6 % Final    Comment:    (NOTE)         Pre-diabetes: 5.7 - 6.4         Diabetes: >6.4         Glycemic control for adults with diabetes: <7.0     CBG: No results for input(s): GLUCAP in the last 168 hours.  Review of Systems:   Cannot obtain due to intubation  Past Medical History  She,  has a past medical history of 2-vessel coronary artery disease, Adult hypothyroidism, Allergic rhinitis (09/12/2016), Anemia of chronic disease, Arthritis, Asthma, CAD in native artery (05/02/2015), Cancer ([place]) (03/31/2017), Chronic diastolic CHF (congestive heart failure) ([place]), Chronic GERD, Chronic kidney disease, stage 3 (moderate) ([place]), CKD (chronic kidney disease) (06/05/2015), Colon polyp, Cough (09/12/2016), Depression, Dyspnea  (09/12/2016), Hallucinations (08/17/2013), Heart attack ([place]), Hyperlipidemia LDL goal <70, Hypertension, essential, benign, Obstructive sleep apnea (09/12/2016), Polio, PONV (postoperative nausea and vomiting), PONV (postoperative nausea and vomiting), Psychophysical visual disturbances (08/17/2013), Sleep apnea, Spondylolisthesis of lumbosacral region (11/07/2016), Stroke [facility]), Vaginal atrophy (01/30/2016), Vitamin D deficiency, and Wheezing (09/12/2016).   Surgical History    Past Surgical History:  Procedure Laterality Date   APPENDECTOMY     BACK SURGERY  11/07/2016   Lumbar region/ 4 screws and 2 rods   CAROTID STENT     CHOLECYSTECTOMY     CORONARY ANGIOPLASTY WITH STENT PLACEMENT     CORONARY BALLOON ANGIOPLASTY     TOTAL ABDOMINAL HYSTERECTOMY       Social History   reports that she quit smoking about 53 years ago. She has never used smokeless tobacco. She reports that she does not drink alcohol or use drugs.   Family History   Cannot obtain due to intubation  Allergies Allergies  Allergen Reactions   Ace Inhibitors Other (See Comments) and Cough    CHEST PAIN   Other     Patient reports receiving blood after a miscarriage "years ago". She states she broke out from receiving this blood. Has not received  any since   Codeine Nausea And Vomiting   Levaquin [Levofloxacin] Nausea And Vomiting     Home Medications  Prior to Admission medications   Medication Sig Start Date End Date Taking? Authorizing Provider  aspirin EC 81 MG tablet Take 81 mg by mouth daily.    [provider]  azelastine (ASTELIN) 0.1 % nasal spray Use two sprays in each nostril twice daily 03/24/18   Kennith Gain, MD  B COMPLEX VITAMINS PO Take by mouth.    [provider]  buPROPion (WELLBUTRIN SR) 150 MG 12 hr tablet Take 150 mg by mouth 2 (two) times daily. Verified SR    [provider]  chlorpheniramine-HYDROcodone (TUSSIONEX PENNKINETIC ER) 10-8 MG/5ML  SUER Take 5 mLs by mouth at bedtime as needed for cough.    [provider]  citalopram (CELEXA) 40 MG tablet Take 40 mg by mouth daily.    [provider]  famotidine (PEPCID) 40 MG tablet  08/13/18   [provider]  ipratropium-albuterol (DUONEB) 0.5-2.5 (3) MG/3ML SOLN Take 3 mLs by nebulization every 6 (six) hours as needed.    [provider]  isosorbide mononitrate (IMDUR) 120 MG 24 hr tablet Take 120 mg by mouth daily.    [provider]  levothyroxine (SYNTHROID) 100 MCG tablet  09/22/18   [provider]  metoprolol (LOPRESSOR) 50 MG tablet Take 50 mg by mouth 2 (two) times daily. Verified tartrate     [provider]  mometasone-formoterol (DULERA) 100-5 MCG/ACT AERO Inhale 2 puffs into the lungs 2 (two) times daily.    [provider]  montelukast (SINGULAIR) 10 MG tablet TAKE ONE TABLET BY MOUTH AT BEDTIME 11/11/18   Kennith Gain, MD  nitroGLYCERIN (NITROSTAT) 0.4 MG SL tablet  04/06/15   [provider]  pantoprazole (PROTONIX) 40 MG tablet Take 40 mg by mouth daily.  02/09/18   [provider]  potassium chloride SA (K-DUR,KLOR-CON) 20 MEQ tablet Take 40 mEq by mouth daily. 10/09/16   [provider]  PROAIR HFA 108 (90 Base) MCG/ACT inhaler Inhale 2 puffs into the lungs every 6 (six) hours as needed for wheezing or shortness of breath.    [provider]  QUEtiapine (SEROQUEL) 25 MG tablet Take 75 mg by mouth at bedtime. Takes 3 at bedtime    [provider]  torsemide (DEMADEX) 20 MG tablet Take 20 mg by mouth daily.    [provider]  TRELEGY ELLIPTA 100-62.5-25 MCG/INH AEPB Inhale 1 Dose into the lungs daily. Rinse, gargle, and spit after use. 11/17/18   Kennith Gain, MD     Critical care time: 63 minutes    Roselie Awkward, MD [place] Pager: [phone] Cell: [phone] If no response, call [phone]

## 2018-12-14 NOTE — Research (Signed)
I, Oren Binet, MD, consented [name] (female, Date of Birth 1940-12-19, 78 y.o.) and with diagnosis of COVID-19, in the [facility] Expanded Access Program (EAP) Research Protocol for [employer] against COVID-19.  The consent took place under following circumstances.   Subject Capacity assessed by this investigator as:  Absence of emotional and mental capacity to consent (i.e. delirium, coma, ventilator).  Consent took place in the following setting(s):  Via video and/or telephone (Time of call: 3:30 pm)   The following were present for the consent process:  Investigator   A copy of the cover letter and signed consent document was provided to subject/LAR.  The original signed consent document has been placed in the subject's physical chart and will be scanned into the electronic medical record upon discharge.  Statement of acknowledgement that the following was discussed with the subject/LAR:    1) Discussed the purpose of the research and procedures  2) Discussed risks and benefits and uncertainties of study participation 3) Discussed subject's responsibilities  4) Discussed the measures in place to maintain subject's confidentiality while a participant on the trial  5) Discussed alternatives to study participation.   6) Discussed study participation is voluntary and that the subject's care would not be jeopardized if they declined participation in the study.   7) Discussed freedom to withdraw at any time.   8) All subject/LAR questions were answered to their satisfaction.   9) In case of emergency consent, investigator agreed to discuss with subject/LAR at earliest available opportunity when the subject stabilizes and/or LAR can be located.     Final Investigator [address] [place], [place] [phone]   Date: 11/20/2018 and 6:41 PM

## 2018-12-14 NOTE — H&P (Signed)
HISTORY AND PHYSICAL       PATIENT DETAILS Name: Jody Taylor Age: 78 y.o. Sex: female Date of Birth: 1940/07/21 Admit Date: 12/11/2018 ZTI:[id number], Jody Havens, MD   Patient coming from: Transfer from [place]:  Shortness of breath-emergently intubated at [facility]  HPI: Jody Taylor is a 78 y.o. female with medical history significant of severe persistent bronchial asthma on Benralizumab every 8 weeks, CAD HTN, chronic diastolic heart failure, hypothyroidism, dyslipidemia-who presented as a transfer from [facility] to ICU [facility] for evaluation of the above-noted complaints.  Please note patient is intubated and currently unable to participate in history taking process.  History is obtained after speaking with ED staff at [facility] and with the patient's daughter-Jody Taylor over the phone.  Patient's husband was initially diagnosed with COVID 19-patient started experiencing symptoms approximately 6-7 days back.  Patient initial symptoms were cough, fever and myalgias.  She was also complaining of fatigue.  She subsequently went to her PCPs office-on 6/26-and subsequently tested positive for COVID-19.  She was isolating herself at home.  Over the week and her symptoms markedly worsen-her shortness of breath got worse.  This morning-the family took her to the emergency room, where she was found to be profoundly hypoxemic-and tachypneic and unable to complete full sentences.  Per [facility] ED MD-patient's O2 saturation on the 100% nonrebreather mask was only 80%.  Patient was emergently intubated-and subsequently transferred to the hospitalist service here at [facility].  At baseline-patient is not on oxygen.  She is independent of all the activities of daily living.  She ambulates without any assistance.  ED Course:  Emergently intubated-started on propofol-given steroids and Actemra.  Central line  placed.  Subsequently transferred to [facility].  Note: Lives at: Home Mobility:  Independent Chronic Indwelling Foley: No   REVIEW OF SYSTEMS: Unable to obtain review of systems-as patient is unable to participate in the history taking process as she is intubated and sedated.   ALLERGIES:   Allergies  Allergen Reactions  . Ace Inhibitors Other (See Comments) and Cough    CHEST PAIN  . Other     Patient reports receiving blood after a miscarriage "years ago". She states she broke out from receiving this blood. Has not received any since  . Codeine Nausea And Vomiting  . Levaquin [Levofloxacin] Nausea And Vomiting    PAST MEDICAL HISTORY: Past Medical History:  Diagnosis Date  . 2-vessel coronary artery disease   . Adult hypothyroidism   . Allergic rhinitis 09/12/2016  . Anemia of chronic disease   . Arthritis   . Asthma   . CAD in native artery 05/02/2015   Overview:   S/P PCI and stent x 2. Last cardiac cath August 2010 with mild nonobstructive CAD and normal LV function PCI and stent of proximal [facility] 1999 Cath Nov 2016:Angiographic findings Cardiac Arteries and Lesion Findings LMCA: Normal. LAD: Normal. LCx: Normal. RCA: Abnormal. Lesion on R PDA: Ostial.35% stenosis 5 mm length . Pre procedure TIMI III flow was noted. Good run off was present.Bifurcation lesion.  Cath 05/08/15:Mild non-obstructive coronary artery disease. Normal LV function  . Cancer ([place]) 03/31/2017  . Chronic diastolic CHF (congestive heart failure) ([place])   . Chronic GERD   . Chronic kidney disease, stage 3 (moderate) ([place])    patient denies (listed in 07/20/16 PCP notes-Dr. Nelda Bucks)  . CKD (chronic kidney disease) 06/05/2015  .  Colon polyp   . Cough 09/12/2016  . Depression    since hysterectomy   . Dyspnea 09/12/2016  . Hallucinations 08/17/2013  . Heart attack ([place])   . Hyperlipidemia LDL goal <70   . Hypertension, essential, benign   . Obstructive sleep apnea 09/12/2016  . Polio    . PONV (postoperative nausea and vomiting)   . PONV (postoperative nausea and vomiting)   . 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 Sleep apnea    wears CPAP  . Spondylolisthesis of lumbosacral region 11/07/2016  . Stroke [facility])    "i had a mini stroke I didn't even know I had it" found on MRI  . Vaginal atrophy 01/30/2016  . Vitamin D deficiency   . Wheezing 09/12/2016    PAST SURGICAL HISTORY: Past Surgical History:  Procedure Laterality Date  . APPENDECTOMY    . BACK SURGERY  11/07/2016   Lumbar region/ 4 screws and 2 rods  . CAROTID STENT    . CHOLECYSTECTOMY    . CORONARY ANGIOPLASTY WITH STENT PLACEMENT    . CORONARY BALLOON ANGIOPLASTY    . TOTAL ABDOMINAL HYSTERECTOMY      MEDICATIONS AT HOME: Prior to Admission medications   Medication Sig Start Date End Date Taking? Authorizing Provider  aspirin EC 81 MG tablet Take 81 mg by mouth daily.    [provider]  azelastine (ASTELIN) 0.1 % nasal spray Use two sprays in each nostril twice daily 03/24/18   Kennith Gain, MD  B COMPLEX VITAMINS PO Take by mouth.    [provider]  buPROPion (WELLBUTRIN SR) 150 MG 12 hr tablet Take 150 mg by mouth 2 (two) times daily. Verified SR    [provider]  chlorpheniramine-HYDROcodone (TUSSIONEX PENNKINETIC ER) 10-8 MG/5ML SUER Take 5 mLs by mouth at bedtime as needed for cough.    [provider]  citalopram (CELEXA) 40 MG tablet Take 40 mg by mouth daily.    [provider]  famotidine (PEPCID) 40 MG tablet  08/13/18   [provider]  ipratropium-albuterol (DUONEB) 0.5-2.5 (3) MG/3ML SOLN Take 3 mLs by nebulization every 6 (six) hours as needed.    [provider]  isosorbide mononitrate (IMDUR) 120 MG 24 hr tablet Take 120 mg by mouth daily.    [provider]  levothyroxine (SYNTHROID) 100 MCG tablet  09/22/18   [provider]  metoprolol (LOPRESSOR) 50 MG tablet Take 50 mg by mouth 2  (two) times daily. Verified tartrate     [provider]  mometasone-formoterol (DULERA) 100-5 MCG/ACT AERO Inhale 2 puffs into the lungs 2 (two) times daily.    [provider]  montelukast (SINGULAIR) 10 MG tablet TAKE ONE TABLET BY MOUTH AT BEDTIME 11/11/18   Kennith Gain, MD  nitroGLYCERIN (NITROSTAT) 0.4 MG SL tablet  04/06/15   [provider]  pantoprazole (PROTONIX) 40 MG tablet Take 40 mg by mouth daily.  02/09/18   [provider]  potassium chloride SA (K-DUR,KLOR-CON) 20 MEQ tablet Take 40 mEq by mouth daily. 10/09/16   [provider]  PROAIR HFA 108 (90 Base) MCG/ACT inhaler Inhale 2 puffs into the lungs every 6 (six) hours as needed for wheezing or shortness of breath.    [provider]  QUEtiapine (SEROQUEL) 25 MG tablet Take 75 mg by mouth at bedtime. Takes 3 at bedtime    [provider]  torsemide (DEMADEX) 20 MG tablet Take 20 mg by mouth daily.  [provider]  TRELEGY ELLIPTA 100-62.5-25 MCG/INH AEPB Inhale 1 Dose into the lungs daily. Rinse, gargle, and spit after use. 11/17/18   Kennith Gain, MD    FAMILY HISTORY: Family History  Problem Relation Age of Onset  . Diabetes Mother   . Stroke Mother   . Asthma Mother   . Heart disease Father   . Stroke Father   . Colon cancer Brother       SOCIAL HISTORY:  reports that she quit smoking about 53 years ago. She has never used smokeless tobacco. She reports that she does not drink alcohol or use drugs.  PHYSICAL EXAM: Blood pressure 121/72, pulse 80, temperature (!) 96.4 F (35.8 C), temperature source Axillary, resp. rate 15, height 4\' 10"  (1.473 m), SpO2 100 %.  General appearance: Intubated-although on propofol-just about able to wake up-seems to move all 4 extremities.  Squeezes fingers on my command. Eyes: pupils equally reactive to light and accomodation,no scleral icterus.Pink conjunctiva HEENT: Atraumatic and  Normocephalic Neck: supple, no JVD.  Resp:Good air entry bilaterally, no added sounds heard anteriorly CVS: S1 S2 regular GI: Bowel sounds present, Non tender and not distended with no gaurding, rigidity or rebound. Extremities: B/L Lower Ext shows no edema, both legs are warm to touch Neurology: Difficult exam-but appears to be nonfocal.  Briefly followed commands and was moving all extremities Musculoskeletal:gait appears to be normal.No digital cyanosis Skin: Skin tear-with a dry scab seen in the right lower leg-just above the ankle. Wounds:N/A  LABS ON ADMISSION:  I have personally reviewed following labs and imaging studies  CBC: No results for input(s): WBC, NEUTROABS, HGB, HCT, MCV, PLT in the last 168 hours.  Basic Metabolic Panel: No results for input(s): NA, K, CL, CO2, GLUCOSE, BUN, CREATININE, CALCIUM, MG, PHOS in the last 168 hours.  GFR: CrCl cannot be calculated (Patient's most recent lab result is older than the maximum 21 days allowed.).  Liver Function Tests: No results for input(s): AST, ALT, ALKPHOS, BILITOT, PROT, ALBUMIN in the last 168 hours. No results for input(s): LIPASE, AMYLASE in the last 168 hours. No results for input(s): AMMONIA in the last 168 hours.  Coagulation Profile: No results for input(s): INR, PROTIME in the last 168 hours.  Cardiac Enzymes: No results for input(s): CKTOTAL, CKMB, CKMBINDEX, TROPONINI in the last 168 hours.  BNP (last 3 results) No results for input(s): PROBNP in the last 8760 hours.  HbA1C: No results for input(s): HGBA1C in the last 72 hours.  CBG: No results for input(s): GLUCAP in the last 168 hours.  Lipid Profile: No results for input(s): CHOL, HDL, LDLCALC, TRIG, CHOLHDL, LDLDIRECT in the last 72 hours.  Thyroid Function Tests: No results for input(s): TSH, T4TOTAL, FREET4, T3FREE, THYROIDAB in the last 72 hours.  Anemia Panel: No results for input(s): VITAMINB12, FOLATE, FERRITIN, TIBC, IRON,  RETICCTPCT in the last 72 hours.  Urine analysis: No results found for: COLORURINE, APPEARANCEUR, LABSPEC, PHURINE, GLUCOSEU, HGBUR, BILIRUBINUR, KETONESUR, PROTEINUR, UROBILINOGEN, NITRITE, LEUKOCYTESUR  Sepsis Labs: Lactic Acid, Venous No results found for: [place]   Microbiology: No results found for this or any previous visit (from the past 240 hour(s)).    RADIOLOGIC STUDIES ON ADMISSION: [place] Ekg Site Rite  Result Date: 11/19/2018 If Site Rite image not attached, placement could not be confirmed due to current cardiac rhythm.   I have personally reviewed images of chest xray done at [facility] severe lung infiltrates bilaterally.  EKG:  Personally reviewed-pending  ASSESSMENT AND  PLAN: Acute hypoxemic respiratory failure with ARDS secondary to COVID-19: Continue full ventilator support-PCCM following for vent management.  Patient is s/p Actemra and Solu-Medrol at [facility] will continue Solu-Medrol, add Remdesivir and also start the patient on convalescent plasma if available.  This MD had a long conversation with the patient's son, daughter and spouse over the phone-we discussed the use of convalescent plasma-rational/risks/benefits were discussed in detail.  I subsequently emailed the patient's son the cover letter and consent form-consent was subsequently signed by the patient's spouse.  CKD stage III: We will repeat labs-Per report from [facility] ED MD-creatinine not far from usual baseline.  CAD: We will resume aspirin-await repeat EKG.  Chronic diastolic heart failure: Appears compensated-no indication for diuretics at this point.  Follow weights, intake output.  Hypothyroidism: Continue IV levothyroxine  Depression: Await pharmacy to perform medication reconciliation-before resuming usual medications.  Palliative care: Spoke with patient's daughter/husband/son over the phone-family realizes patient is critically ill and at risk of  dying-plan is to provide full ventilator support-maximal medical management.  However if patient does suffer a cardiac arrest-family is agreeable with a DNR order.  Further plan will depend as patient's clinical course evolves and further radiologic and laboratory data become available. Patient will be monitored closely.  Above noted plan was discussed with family face to face at bedside, they were in agreement.   CONSULTS: None  DVT Prophylaxis: Prophylactic Lovenox at twice daily dosing  GI prophylaxis: PPI  Code Status: DNR  Disposition Plan: Remain in the ICU-disposition is uncertain at this time   Admission status: Inpatient  going to ICU  The patient is critically ill with multiple organ system failure and requires high complexity decision making for assessment and support, frequent evaluation and titration of therapies, advanced monitoring, review of radiographic studies and interpretation of complex data.    Total time spent  55 minutes.Greater than 50% of this time was spent in counseling, explanation of diagnosis, planning of further management, and coordination of care.  Severity of illness: The appropriate patient status for this patient is INPATIENT. Inpatient status is judged to be reasonable and necessary in order to provide the required intensity of service to ensure the patient's safety. The patient's presenting symptoms, physical exam findings, and initial radiographic and laboratory data in the context of their chronic comorbidities is felt to place them at high risk for further clinical deterioration. Furthermore, it is not anticipated that the patient will be medically stable for discharge from the hospital within 2 midnights of admission. The following factors support the patient status of inpatient.   " The patient's presenting symptoms include severe acute respiratory distress " The worrisome physical exam findings include tachypnea " The initial radiographic  and laboratory data are worrisome because of chest x-ray with severe bilateral infiltrates " The chronic co-morbidities include severe persistent asthma, chronic diastolic heart failure, CKD  * I certify that at the point of admission it is my clinical judgment that the patient will require inpatient hospital care spanning beyond 2 midnights from the point of admission due to high intensity of service, high risk for further deterioration and high frequency of surveillance required.  Oren Binet [facility] Pager [phone]  If 7PM-7AM, please contact night-coverage  Please page via [email or web address]  Go to [email or web address] and use [place]'s universal password to access. If you do not have the password, please contact the hospital operator.  Locate the [facility] provider you are looking for under [facility] and page to a  number that you can be directly reached. If you still have difficulty reaching the provider, please page the [facility] (Director on Call) for the Hospitalists listed on amion for assistance.  11/22/2018, 6:17 PM

## 2018-12-14 NOTE — Progress Notes (Signed)
Pt arrived to [facility] from [facility]. Upon arrival pt intubated with 7.5 ETT at 21cm at the lip. [facility] did not give settings. Dr. Lake Bells notified. CXR to be ordered and will titrate settings after initial ABG.

## 2018-12-15 ENCOUNTER — Inpatient Hospital Stay (HOSPITAL_COMMUNITY): Payer: Medicare HMO

## 2018-12-15 LAB — D-DIMER, QUANTITATIVE: D-Dimer, Quant: 2.02 ug/mL-FEU — ABNORMAL HIGH (ref 0.00–0.50)

## 2018-12-15 LAB — POCT I-STAT 7, (LYTES, BLD GAS, ICA,H+H)
Acid-base deficit: 6 mmol/L — ABNORMAL HIGH (ref 0.0–2.0)
Acid-base deficit: 3 mmol/L — ABNORMAL HIGH (ref 0.0–2.0)
Acid-base deficit: 5 mmol/L — ABNORMAL HIGH (ref 0.0–2.0)
Bicarbonate: 20 mmol/L (ref 20.0–28.0)
Bicarbonate: 21.1 mmol/L (ref 20.0–28.0)
Bicarbonate: 20.9 mmol/L (ref 20.0–28.0)
Calcium, Ion: 1.16 mmol/L (ref 1.15–1.40)
Calcium, Ion: 1.16 mmol/L (ref 1.15–1.40)
Calcium, Ion: 1.2 mmol/L (ref 1.15–1.40)
HCT: 32 % — ABNORMAL LOW (ref 36.0–46.0)
HCT: 31 % — ABNORMAL LOW (ref 36.0–46.0)
HCT: 29 % — ABNORMAL LOW (ref 36.0–46.0)
Hemoglobin: 10.9 g/dL — ABNORMAL LOW (ref 12.0–15.0)
Hemoglobin: 10.5 g/dL — ABNORMAL LOW (ref 12.0–15.0)
Hemoglobin: 9.9 g/dL — ABNORMAL LOW (ref 12.0–15.0)
O2 Saturation: 98 %
O2 Saturation: 98 %
O2 Saturation: 94 %
Patient temperature: 98.1
Patient temperature: 98.5
Potassium: 3.7 mmol/L (ref 3.5–5.1)
Potassium: 4.3 mmol/L (ref 3.5–5.1)
Potassium: 3.6 mmol/L (ref 3.5–5.1)
Sodium: 136 mmol/L (ref 135–145)
Sodium: 135 mmol/L (ref 135–145)
Sodium: 138 mmol/L (ref 135–145)
TCO2: 21 mmol/L — ABNORMAL LOW (ref 22–32)
TCO2: 22 mmol/L (ref 22–32)
TCO2: 22 mmol/L (ref 22–32)
pCO2 arterial: 41.2 mmHg (ref 32.0–48.0)
pCO2 arterial: 34.9 mmHg (ref 32.0–48.0)
pCO2 arterial: 41.5 mmHg (ref 32.0–48.0)
pH, Arterial: 7.293 — ABNORMAL LOW (ref 7.350–7.450)
pH, Arterial: 7.389 (ref 7.350–7.450)
pH, Arterial: 7.311 — ABNORMAL LOW (ref 7.350–7.450)
pO2, Arterial: 118 mmHg — ABNORMAL HIGH (ref 83.0–108.0)
pO2, Arterial: 101 mmHg (ref 83.0–108.0)
pO2, Arterial: 76 mmHg — ABNORMAL LOW (ref 83.0–108.0)

## 2018-12-15 LAB — COMPREHENSIVE METABOLIC PANEL
ALT: 34 U/L (ref 0–44)
AST: 40 U/L (ref 15–41)
Albumin: 2.4 g/dL — ABNORMAL LOW (ref 3.5–5.0)
Alkaline Phosphatase: 69 U/L (ref 38–126)
Anion gap: 10 (ref 5–15)
BUN: 36 mg/dL — ABNORMAL HIGH (ref 8–23)
CO2: 22 mmol/L (ref 22–32)
Calcium: 7.9 mg/dL — ABNORMAL LOW (ref 8.9–10.3)
Chloride: 106 mmol/L (ref 98–111)
Creatinine, Ser: 1.81 mg/dL — ABNORMAL HIGH (ref 0.44–1.00)
GFR calc Af Amer: 31 mL/min — ABNORMAL LOW (ref >60)
GFR calc non Af Amer: 27 mL/min — ABNORMAL LOW (ref >60)
Glucose, Bld: 230 mg/dL — ABNORMAL HIGH (ref 70–99)
Potassium: 3.7 mmol/L (ref 3.5–5.1)
Sodium: 138 mmol/L (ref 135–145)
Total Bilirubin: 0.2 mg/dL — ABNORMAL LOW (ref 0.3–1.2)
Total Protein: 5.5 g/dL — ABNORMAL LOW (ref 6.5–8.1)

## 2018-12-15 LAB — CBC WITH DIFFERENTIAL/PLATELET
Abs Immature Granulocytes: 0.04 10*3/uL (ref 0.00–0.07)
Basophils Absolute: 0 10*3/uL (ref 0.0–0.1)
Basophils Relative: 0 %
Eosinophils Absolute: 0 10*3/uL (ref 0.0–0.5)
Eosinophils Relative: 0 %
HCT: 35.5 % — ABNORMAL LOW (ref 36.0–46.0)
Hemoglobin: 11.4 g/dL — ABNORMAL LOW (ref 12.0–15.0)
Immature Granulocytes: 1 %
Lymphocytes Relative: 10 %
Lymphs Abs: 0.4 10*3/uL — ABNORMAL LOW (ref 0.7–4.0)
MCH: 30.5 pg (ref 26.0–34.0)
MCHC: 32.1 g/dL (ref 30.0–36.0)
MCV: 94.9 fL (ref 80.0–100.0)
Monocytes Absolute: 0.1 10*3/uL (ref 0.1–1.0)
Monocytes Relative: 4 %
Neutro Abs: 2.9 10*3/uL (ref 1.7–7.7)
Neutrophils Relative %: 85 %
Platelets: 180 10*3/uL (ref 150–400)
RBC: 3.74 MIL/uL — ABNORMAL LOW (ref 3.87–5.11)
RDW: 14.7 % (ref 11.5–15.5)
WBC: 3.4 10*3/uL — ABNORMAL LOW (ref 4.0–10.5)
nRBC: 0 % (ref 0.0–0.2)

## 2018-12-15 LAB — ABO/RH: ABO/RH(D): A POS

## 2018-12-15 LAB — PHOSPHORUS
Phosphorus: 3 mg/dL (ref 2.5–4.6)
Phosphorus: 2.9 mg/dL (ref 2.5–4.6)

## 2018-12-15 LAB — TRIGLYCERIDES: Triglycerides: 280 mg/dL — ABNORMAL HIGH (ref <150)

## 2018-12-15 LAB — GLUCOSE, CAPILLARY
Glucose-Capillary: 172 mg/dL — ABNORMAL HIGH (ref 70–99)
Glucose-Capillary: 184 mg/dL — ABNORMAL HIGH (ref 70–99)
Glucose-Capillary: 190 mg/dL — ABNORMAL HIGH (ref 70–99)
Glucose-Capillary: 215 mg/dL — ABNORMAL HIGH (ref 70–99)
Glucose-Capillary: 225 mg/dL — ABNORMAL HIGH (ref 70–99)
Glucose-Capillary: 262 mg/dL — ABNORMAL HIGH (ref 70–99)

## 2018-12-15 LAB — MAGNESIUM
Magnesium: 2 mg/dL (ref 1.7–2.4)
Magnesium: 2 mg/dL (ref 1.7–2.4)

## 2018-12-15 LAB — FERRITIN: Ferritin: 305 ng/mL (ref 11–307)

## 2018-12-15 LAB — C-REACTIVE PROTEIN: CRP: 9.9 mg/dL — ABNORMAL HIGH (ref <1.0)

## 2018-12-15 MED ORDER — VITAL HIGH PROTEIN PO LIQD
1000.0000 mL | ORAL | Status: DC
  Administered 2018-12-15 – 2018-12-17 (×3): 1000 mL

## 2018-12-15 MED ORDER — SODIUM CHLORIDE 0.9% FLUSH
10.0000 mL | Freq: Two times a day (BID) | INTRAVENOUS | Status: DC
  Administered 2018-12-15 – 2018-12-19 (×7): 10 mL
  Administered 2018-12-20: 21:00:00 20 mL
  Administered 2018-12-21 – 2018-12-31 (×19): 10 mL

## 2018-12-15 MED ORDER — FREE WATER
200.0000 mL | Freq: Three times a day (TID) | Status: DC
  Administered 2018-12-15 – 2018-12-20 (×15): 200 mL

## 2018-12-15 MED ORDER — METHYLPREDNISOLONE SODIUM SUCC 125 MG IJ SOLR
60.0000 mg | Freq: Two times a day (BID) | INTRAMUSCULAR | Status: DC
  Administered 2018-12-15 – 2018-12-16 (×2): 60 mg via INTRAVENOUS
  Filled 2018-12-15 (×2): qty 2

## 2018-12-15 MED ORDER — NITROGLYCERIN 0.4 MG SL SUBL: 0.4000 mg | SUBLINGUAL_TABLET | SUBLINGUAL | Status: DC | PRN

## 2018-12-15 MED ORDER — CHLORHEXIDINE GLUCONATE CLOTH 2 % EX PADS
6.0000 | MEDICATED_PAD | Freq: Every day | CUTANEOUS | Status: DC
  Administered 2018-12-15 – 2018-12-31 (×17): 6 via TOPICAL

## 2018-12-15 MED ORDER — PRO-STAT SUGAR FREE PO LIQD
60.0000 mL | Freq: Every day | ORAL | Status: DC
  Administered 2018-12-16 – 2018-12-18 (×3): 60 mL
  Filled 2018-12-15 (×3): qty 60

## 2018-12-15 MED ORDER — SODIUM CHLORIDE 0.9% FLUSH: 10.0000 mL | INTRAVENOUS | Status: DC | PRN

## 2018-12-15 NOTE — Progress Notes (Signed)
Wasted 20ccs of Fentanyl in sink with Brayton Layman, RN.

## 2018-12-15 NOTE — Progress Notes (Signed)
NAME:  Jody Taylor, MRN:  [id number], DOB:  1941-04-03, LOS: 1 ADMISSION DATE:  12/07/2018, CONSULTATION DATE:  12/07/2018 REFERRING MD:  Sloan Leiter, CHIEF COMPLAINT:  Dyspnea   Brief History   Female with a history of diastolic heart failure admitted on June 29 for ARDS in the setting of COVID-19 pneumonia.  Required intubation at [facility] emergency room.  Past Medical History  Diastolic heart failure History of stroke (mini stroke) Obstructive sleep apnea on CPAP Hypertension Hyperlipidemia Coronary artery disease, had PCI in 9509 Chronic diastolic heart failure Chronic kidney disease GERD Allergic rhinitis  Significant Hospital Events   June 29 admission  Consults:  Pulmonary and critical care medicine  Procedures:  June 29 endotracheal tube> June 29 right internal jugular central venous line>   Significant Diagnostic Tests:    Micro Data:  June 26 SARS-CoV-2 positive  Antimicrobials:  June 29 remdesivir June 29 Actemra June 29 solumedrol June 29 convalescent plasma   Interim history/subjective:  As above  Objective   Blood pressure 108/64, pulse 82, temperature 97.8 F (36.6 C), temperature source Axillary, resp. rate (!) 27, height 4\' 10"  (1.473 m), weight 91.1 kg, SpO2 (!) 88 %.    Vent Mode: PCV FiO2 (%):  [50 %-100 %] 50 % Set Rate:  [24 bmp-26 bmp] 26 bmp Vt Set:  [320 mL-420 mL] 320 mL PEEP:  [12 cmH20] 12 cmH20 Plateau Pressure:  [19 cmH20-28 cmH20] 19 cmH20   Intake/Output Summary (Last 24 hours) at 12/15/2018 1402 Last data filed at 12/15/2018 1320 Gross per 24 hour  Intake 2361.99 ml  Output 1645 ml  Net 716.99 ml   Filed Weights   11/26/2018 1900 12/15/18 0450  Weight: 90.4 kg 91.1 kg    Examination:  General:  In bed on vent HENT: NCAT ETT in place PULM: CTA B, vent supported breathing CV: RRR, no mgr GI: BS+, soft, nontender MSK: normal bulk and tone Neuro: sedated on vent  December 15, 2018 images independently reviewed  bilateral hazy opacities in bases, endotracheal tube in place, cardiomegaly, right IJ central line in place  Resolved Hospital Problem list     Assessment & Plan:  ARDS due to COVID-19 pneumonia  ARDS ventilator protocol, target tidal volume 6 to 8 cc/kg ideal body weight, ideally keeping driving pressure less than 15 cm of water and plateau pressure less than 30 cm of water Diurese as able, blood pressure slightly low so we will hold off on Lasix today CVP monitoring, goal is less than 4 Ventilator associated pneumonia prevention protocol: Head of bed greater than 30 degrees, chlorhexidine mouth rinses twice a day, mouth care every 2 hours ABG now Target PaO2 is 55-65, adjust PEEP/FiO2 per ARDS table No need to move to prone position as PaO2:FiO2 ratio is 202 Convalescent plasma given yesterday Continue giving Solu-Medrol and Remdesivir RA SS goal for ventilator synchrony is -2, continue fentanyl infusion and propofol Chest x-ray as needed Endotracheal tube adjusted today  Baseline asthma? on Fasenra as outpatient, unclear pulmonary function testing, notes from [place] pulmonary just inidcate allergic rhinitis and cough As needed albuterol  Diastolic heart failure: Diurese as able Telemetry monitoring Hold metoprolol and nitrates today given soft blood pressure CVP monitoring > measure today   Best practice:  Diet: tube feeding Pain/Anxiety/Delirium protocol (if indicated): yes, target -2, Fentanyl, propofol infusions VAP protocol (if indicated): yes DVT prophylaxis: lovenox GI prophylaxis: Pantoprazole for stress ulcer prophylaxis Glucose control: SSI Mobility: passive range of motion Code Status: full Family  Communication: none bedside Disposition: remain inICU  Labs   CBC: Recent Labs  Lab 12/09/2018 1850 12/13/2018 1945 12/15/18 0100 12/15/18 0447 12/15/18 0505  WBC 4.4  --   --   --  3.4*  NEUTROABS 3.9  --   --   --  2.9  HGB 12.2 11.6* 10.9* 10.5* 11.4*  HCT  36.8 34.0* 32.0* 31.0* 35.5*  MCV 93.6  --   --   --  94.9  PLT 171  --   --   --  211    Basic Metabolic Panel: Recent Labs  Lab 12/12/2018 1850 11/22/2018 1945 12/15/18 0100 12/15/18 0447 12/15/18 0505  NA 135 135 136 135 138  K 4.3 4.3 3.7 4.3 3.7  CL 104  --   --   --  106  CO2 19*  --   --   --  22  GLUCOSE 191*  --   --   --  230*  BUN 30*  --   --   --  36*  CREATININE 1.87*  --   --   --  1.81*  CALCIUM 8.1*  --   --   --  7.9*  MG 1.9  --   --   --  2.0  PHOS 3.3  --   --   --  3.0   GFR: Estimated Creatinine Clearance: 25.5 mL/min (A) (by C-G formula based on SCr of 1.81 mg/dL (H)). Recent Labs  Lab 12/12/2018 1850 12/15/18 0505  PROCALCITON <0.10  --   WBC 4.4 3.4*    Liver Function Tests: Recent Labs  Lab 11/24/2018 1850 12/15/18 0505  AST 46* 40  ALT 38 34  ALKPHOS 72 69  BILITOT 0.3 0.2*  PROT 6.0* 5.5*  ALBUMIN 2.6* 2.4*   No results for input(s): LIPASE, AMYLASE in the last 168 hours. No results for input(s): AMMONIA in the last 168 hours.  ABG    Component Value Date/Time   PHART 7.389 12/15/2018 0447   PCO2ART 34.9 12/15/2018 0447   PO2ART 101.0 12/15/2018 0447   HCO3 21.1 12/15/2018 0447   TCO2 22 12/15/2018 0447   ACIDBASEDEF 3.0 (H) 12/15/2018 0447   O2SAT 98.0 12/15/2018 0447     Coagulation Profile: No results for input(s): INR, PROTIME in the last 168 hours.  Cardiac Enzymes: No results for input(s): CKTOTAL, CKMB, CKMBINDEX, TROPONINI in the last 168 hours.  HbA1C: Hgb A1c MFr Bld  Date/Time Value Ref Range Status  11/08/2016 07:24 AM 5.6 4.8 - 5.6 % Final    Comment:    (NOTE)         Pre-diabetes: 5.7 - 6.4         Diabetes: >6.4         Glycemic control for adults with diabetes: <7.0     CBG: Recent Labs  Lab 11/30/2018 2059 12/15/18 0038 12/15/18 0440 12/15/18 0821 12/15/18 1151  GLUCAP 200* 262* 225* 172* 215*       Critical care time: 33 minutes     Roselie Awkward, MD [place] [facility] Pager: [phone]  Cell: [phone] If no response, call [phone]

## 2018-12-15 NOTE — Progress Notes (Signed)
Pt's family called to check on pt. Updated them of pt condition and plan of care.

## 2018-12-15 NOTE — Progress Notes (Signed)
eLink Physician-Brief Progress Note Patient Name: Jody Taylor DOB: 1941-02-26 MRN: [id number]   Date of Service  12/15/2018  HPI/Events of Note  Came with foley cath.   eICU Interventions  Ordered foley      Intervention Category Minor Interventions: Routine modifications to care plan (e.g. PRN medications for pain, fever)  Elmer Sow 12/15/2018, 4:59 AM

## 2018-12-15 NOTE — Progress Notes (Signed)
Facetime call made to pt's daughter, Margaretha Sheffield. Updated her and her family of pt condition and plan of care.Answerred their quiestions and they verbalized understanding. Family was appreciative of facetime call.

## 2018-12-15 NOTE — Progress Notes (Signed)
Inpatient Diabetes Program Recommendations  AACE/ADA: New Consensus Statement on Inpatient Glycemic Control (2015)  Target Ranges:  Prepandial:   less than 140 mg/dL      Peak postprandial:   less than 180 mg/dL (1-2 hours)      Critically ill patients:  140 - 180 mg/dL   Lab Results  Component Value Date   GLUCAP 172 (H) 12/15/2018   HGBA1C 5.6 11/08/2016    Review of Glycemic Control Results for ZEINA, AKKERMAN (MRN [id number]) as of 12/15/2018 09:53  Ref. Range 12/15/2018 00:38 12/15/2018 04:40 12/15/2018 08:21  Glucose-Capillary Latest Ref Range: 70 - 99 mg/dL 262 (H) 225 (H) 172 (H)    Current orders for Inpatient glycemic control: Novolog 0-15 units Q4H Solumedrol 60 mg BID  Inpatient Diabetes Program Recommendations:    Recommending switching to COVID order set and place Levemir 9 units BID and add Novolog 3 units Q4H for tube feed coverage (to be stopped or held if tube feeds are stopped).   Thanks, Bronson Curb, MSN, RNC-OB Diabetes Coordinator [phone] (8a-5p)

## 2018-12-15 NOTE — Progress Notes (Addendum)
PROGRESS NOTE                                                                                                                                                                                                             Patient Demographics:    Jody Taylor, is a 78 y.o. female, DOB - June 29, 1940, ZOX:[id number]  Outpatient Primary MD for the patient is Nicoletta Dress, MD    LOS - 1  Admit date - 12/12/2018    CC - SOB     Brief Narrative  Jody Taylor is a 78 y.o. female with medical history significant of severe persistent bronchial asthma on Benralizumab every 8 weeks, CAD HTN, chronic diastolic heart failure, hypothyroidism, dyslipidemia- who had 6 to 8-day history of fever cough shortness of breath presented to [facility] ER was diagnosed with COVID-19 pneumonitis causing acute hypoxic respiratory failure, she was intubated in the ER and transferred to [facility].   Subjective:    Jody Taylor today is intubated - sedated, in no distress.   Assessment  & Plan :     1. Acute Hypoxic Resp. Failure due to Acute Covid 19 Viral Pneumonitis during the ongoing 2020 Covid 19 Pandemic -  Was intubated at [facility] - remains intubated, FiO2 requirements descent, stable peak and plateau with plateau around 22, due to some dyssynchrony initial mode has been PCV will defer vent management to PCCM.  For her COVID-19 pneumonitis she has already received maximal treatment which includes IV steroids, REMDESIVIR, Actemra along with convalescent plasma on the day of admission.  Clinically stable with some improvement in hypoxia and oxygen need.  IV steroids being continued for now.  Vent Mode: PCV FiO2 (%):  [50 %-100 %] 50 % Set Rate:  [24 bmp-26 bmp] 26 bmp Vt Set:  [320 mL-420 mL] 320 mL PEEP:  [12 cmH20] 12 cmH20 Plateau Pressure:  [22 cmH20-28 cmH20] 22 cmH20  ABG     Component Value Date/Time   PHART 7.389 12/15/2018 0447   PCO2ART  34.9 12/15/2018 0447   PO2ART 101.0 12/15/2018 0447   HCO3 21.1 12/15/2018 0447   TCO2 22 12/15/2018 0447   ACIDBASEDEF 3.0 (H) 12/15/2018 0447   O2SAT 98.0 12/15/2018 0447    COVID-19 Labs  Recent Labs    12/04/2018 1850 12/15/18 0505  DDIMER 1.55* 2.02*  FERRITIN  257 305  LDH 333*  --   CRP 7.6* 9.9*    No results found for: SARSCOV2NAA   Hepatic Function Latest Ref Rng & Units 12/15/2018 11/23/2018  Total Protein 6.5 - 8.1 g/dL 5.5(L) 6.0(L)  Albumin 3.5 - 5.0 g/dL 2.4(L) 2.6(L)  AST 15 - 41 U/L 40 46(H)  ALT 0 - 44 U/L 34 38  Alk Phosphatase 38 - 126 U/L 69 72  Total Bilirubin 0.3 - 1.2 mg/dL 0.2(L) 0.3        Component Value Date/Time   BNP 272.5 (H) 11/22/2018 1850      2.  Chronic kidney disease 3.  Baseline creatinine around 1.6.  Continue to monitor.  3.  CAD.  Stable no acute issues currently on aspirin home dose,  4.  Underlying history of asthma.  Currently no wheezing, takes  Benralizumab every 8 weeks, on Supportive care.  5.  GERD.  PPI.  6.  Dyslipidemia.  On statin.  7.  Hypothyroidism.  Continue home dose Synthroid.  8.  Feeding via OG tube, add free water.   9.  Steroid induced hyperglycemia.  ISS.  CBG (last 3)  Recent Labs    12/15/18 0038 12/15/18 0440 12/15/18 0821  GLUCAP 262* 225* 172*     Condition - Extremely Guarded  Family Communication  :  Updated son and husband 12/15/18  Code Status : Full  Diet : OG Tube  Diet Order    None       Disposition Plan  :  ICU  Consults  :  PCCM  Procedures  :    ETT 6/29 R.IJ - 6/29 Foley 6/29  PUD Prophylaxis : PPI  DVT Prophylaxis  :  Lovenox    Lab Results  Component Value Date   PLT 180 12/15/2018    Inpatient Medications  Scheduled Meds:  sodium chloride   Intravenous Once   aspirin  81 mg Per Tube Daily   chlorhexidine gluconate (MEDLINE KIT)  15 mL Mouth Rinse BID   enoxaparin (LOVENOX) injection  40 mg Subcutaneous Q12H   feeding supplement  (PRO-STAT SUGAR FREE 64)  30 mL Per Tube BID   feeding supplement (VITAL HIGH PROTEIN)  1,000 mL Per Tube Q24H   fentaNYL (SUBLIMAZE) injection  25 mcg Intravenous Once   insulin aspart  0-15 Units Subcutaneous Q4H   levothyroxine  50 mcg Intravenous Daily   mouth rinse  15 mL Mouth Rinse 10 times per day   methylPREDNISolone (SOLU-MEDROL) injection  60 mg Intravenous Q12H   pantoprazole sodium  40 mg Per Tube Daily   sodium chloride flush  3 mL Intravenous Q12H   vitamin C  500 mg Per Tube Daily   zinc sulfate  220 mg Per Tube Daily   Continuous Infusions:  sodium chloride 50 mL/hr at 12/15/18 0500   fentaNYL infusion INTRAVENOUS 75 mcg/hr (12/15/18 0500)   propofol (DIPRIVAN) infusion 20 mcg/kg/min (12/15/18 6803)   remdesivir 100 mg in NS 250 mL     PRN Meds:.acetaminophen, fentaNYL, hydrALAZINE, ipratropium-albuterol, [DISCONTINUED] ondansetron **OR** ondansetron (ZOFRAN) IV, polyethylene glycol  Antibiotics  :    Anti-infectives (From admission, onward)   Start     Dose/Rate Route Frequency Ordered Stop   12/15/18 2000  remdesivir 100 mg in sodium chloride 0.9 % 250 mL IVPB     100 mg 500 mL/hr over 30 Minutes Intravenous Every 24 hours 11/20/2018 1845 12/19/18 1959   12/06/2018 2000  remdesivir 200 mg in sodium chloride 0.9 %  250 mL IVPB     200 mg 500 mL/hr over 30 Minutes Intravenous Once 11/25/2018 1845 12/07/2018 2116       Time Spent in minutes  30   Lala Lund M.D on 12/15/2018 at 9:47 AM  To page go to [email or web address] - password [facility]  [facility] -  Office  [phone]  See all Orders from today for further details    Objective:   Vitals:   12/15/18 0747 12/15/18 0800 12/15/18 0808 12/15/18 0900  BP:  101/65 105/63 104/63  Pulse:  69 70 72  Resp:   (!) 29   Temp: 97.7 F (36.5 C)     TempSrc:      SpO2:  94% 96% (!) 88%  Weight:      Height:        Wt Readings from Last 3 Encounters:  12/15/18 91.1 kg  03/02/18 95.5 kg    01/13/18 95.7 kg     Intake/Output Summary (Last 24 hours) at 12/15/2018 0947 Last data filed at 12/15/2018 0900 Gross per 24 hour  Intake 1479.91 ml  Output 1515 ml  Net -35.09 ml     Physical Exam  Patient intubated - sedated, ETT, Foley, R IJ C Line in palce [place].AT,PERRAL Supple Neck,No JVD, No cervical lymphadenopathy appriciated.  Symmetrical Chest wall movement, Good air movement bilaterally, CTAB RRR,No Gallops,Rubs or new Murmurs, No Parasternal Heave +ve B.Sounds, Abd Soft, No tenderness, No organomegaly appriciated, No rebound - guarding or rigidity. No Cyanosis, Clubbing or edema, No new Rash or bruise      Data Review:    CBC Recent Labs  Lab 12/15/2018 1850 12/12/2018 1945 12/15/18 0100 12/15/18 0447 12/15/18 0505  WBC 4.4  --   --   --  3.4*  HGB 12.2 11.6* 10.9* 10.5* 11.4*  HCT 36.8 34.0* 32.0* 31.0* 35.5*  PLT 171  --   --   --  180  MCV 93.6  --   --   --  94.9  MCH 31.0  --   --   --  30.5  MCHC 33.2  --   --   --  32.1  RDW 14.7  --   --   --  14.7  LYMPHSABS 0.2*  --   --   --  0.4*  MONOABS 0.1  --   --   --  0.1  EOSABS 0.0  --   --   --  0.0  BASOSABS 0.0  --   --   --  0.0    Chemistries  Recent Labs  Lab 12/09/2018 1850 11/26/2018 1945 12/15/18 0100 12/15/18 0447 12/15/18 0505  NA 135 135 136 135 138  K 4.3 4.3 3.7 4.3 3.7  CL 104  --   --   --  106  CO2 19*  --   --   --  22  GLUCOSE 191*  --   --   --  230*  BUN 30*  --   --   --  36*  CREATININE 1.87*  --   --   --  1.81*  CALCIUM 8.1*  --   --   --  7.9*  MG 1.9  --   --   --  2.0  AST 46*  --   --   --  40  ALT 38  --   --   --  34  ALKPHOS 72  --   --   --  69  BILITOT 0.3  --   --   --  0.2*   ------------------------------------------------------------------------------------------------------------------ Recent Labs    12/15/18 0505  TRIG 280*    Lab Results  Component Value Date   HGBA1C 5.6 11/08/2016    ------------------------------------------------------------------------------------------------------------------ No results for input(s): TSH, T4TOTAL, T3FREE, THYROIDAB in the last 72 hours.  Invalid input(s): FREET3  Cardiac Enzymes No results for input(s): CKMB, TROPONINI, MYOGLOBIN in the last 168 hours.  Invalid input(s): CK ------------------------------------------------------------------------------------------------------------------    Component Value Date/Time   BNP 272.5 (H) 12/03/2018 1850    Micro Results Recent Results (from the past 240 hour(s))  MRSA PCR Screening     Status: None   Collection Time: 11/27/2018  5:57 PM   Specimen: Nasal Mucosa; Nasopharyngeal  Result Value Ref Range Status   MRSA by PCR NEGATIVE NEGATIVE Final    Comment:        The GeneXpert MRSA Assay (FDA approved for NASAL specimens only), is one component of a comprehensive MRSA colonization surveillance program. It is not intended to diagnose MRSA infection nor to guide or monitor treatment for MRSA infections. Performed at [facility], [place] [address], [place], [place] [address]     Radiology Reports Dg Abd 1 View  Result Date: 11/17/2018 CLINICAL DATA:  Orogastric tube placement. EXAM: ABDOMEN - 1 VIEW COMPARISON:  None FINDINGS: Orogastric tube tip is in the distal body of the stomach. No dilated bowel.  Previous lumbar fusion at L5-S1. IMPRESSION: OG tube tip is in the distal stomach. Electronically Signed   By: Lorriane Shire M.D.   On: 12/07/2018 20:47   Portable Chest 1 View  Result Date: 12/15/2018 CLINICAL DATA:  Shortness of breath.  COVID-19. EXAM: PORTABLE CHEST 1 VIEW COMPARISON:  12/04/2018. FINDINGS: Patient is rotated to the right. Endotracheal tube tip noted just above the right mainstem bronchus. Retraction of approximately 2 cm suggested. NG tube noted with tip below left hemidiaphragm. Right IJ line stable position. Heart size stable. Diffuse  severe bilat interstitial infiltrates are again noted. Persistent low lung volumes. No pleural effusion or pneumothorax. IMPRESSION: 1. Endotracheal tube tip is noted just above the right mainstem bronchus. Retraction of approximately 2 cm suggested. NG tube and right IJ line stable position. 2. Diffuse severe bilateral pulmonary interstitial infiltrates again noted. Persistent low lung volumes. Critical Value/emergent results were called by telephone at the time of interpretation on 12/15/2018 at 7:01 am to nurse Elmyra Ricks, who verbally acknowledged these results. Electronically Signed   By: Marcello Moores  Register   On: 12/15/2018 07:02   Dg Chest Port 1 View  Result Date: 11/25/2018 CLINICAL DATA:  Acute respiratory failure with hypoxia. EXAM: PORTABLE CHEST 1 VIEW COMPARISON:  December 14, 2018 FINDINGS: The endotracheal tube terminates above the carina by approximately 2.1 cm. The right-sided central venous catheter is well position. The enteric tube extends below the left hemidiaphragm. The heart size is enlarged. Aortic calcifications are noted. Again seen are diffuse bilateral hazy airspace opacities with some significant improvement from prior study. This may be in part due to approved imaging technique. There is no definite pneumothorax. There are likely small bilateral pleural effusions. IMPRESSION: 1. Lines and tubes as above. 2. Persistent but slightly improved multifocal airspace opacities which can be seen in patients with pulmonary edema or an atypical infectious process. Electronically Signed   By: Constance Holster M.D.   On: 12/08/2018 19:07   [place] Ekg Site Rite  Result Date: 12/01/2018 If Site Rite image not attached, placement could not be confirmed due to current cardiac rhythm.

## 2018-12-15 NOTE — Progress Notes (Signed)
Pts daughter and son called at separate times last night and both were updated. Both were very interested in when their mother would receive the convalescent plasma. They were updated and all questions were answered.

## 2018-12-15 NOTE — Progress Notes (Signed)
Initial Nutrition Assessment RD working remotely.  DOCUMENTATION CODES:   Morbid obesity  INTERVENTION:    Vital High Protein at 35 ml/h (840 ml per day)   Pro-stat 60 ml once daily   Provides 1040 kcal (1510 kcal total with lipid calories from Propofol), 104 gm protein, 702 ml free water daily  NUTRITION DIAGNOSIS:   Inadequate oral intake related to inability to eat as evidenced by NPO status.  GOAL:   Provide needs based on ASPEN/SCCM guidelines  MONITOR:   Vent status, TF tolerance, I & O's, Labs  REASON FOR ASSESSMENT:   Ventilator, Consult Enteral/tube feeding initiation and management  ASSESSMENT:   78 yo female admitted with acute respiratory failure r/t COVID pneumonitis requiring intubation. PMH of severe asthma, anemia, CAD, CHF, vitamin D deficiency, HTN, HLD, GERD, CKD-3.  Received MD Consult for TF initiation and management. OGT in place.  Patient is currently intubated on ventilator support MV: 8.9 L/min Temp (24hrs), Avg:97.6 F (36.4 C), Min:96.4 F (35.8 C), Max:98.6 F (37 C)  MAP >/= 70 Propofol: 17.8 ml/hr providing 470 kcal from lipid  Labs reviewed. BUN 36 (H), creatinine 1.81 (H), triglycerides 280 (H) CBG's: 262-225-172  Medications reviewed and include Solu-medrol, Novolog, vitamin C, zinc, propofol.   NUTRITION - FOCUSED PHYSICAL EXAM:  deferred  Diet Order:   Diet Order    None      EDUCATION NEEDS:   No education needs have been identified at this time  Skin:  Skin Assessment: Reviewed RN Assessment(MASD to abdominal skin folds)  Last BM:  PTA  Height:   Ht Readings from Last 1 Encounters:  11/30/2018 4\' 10"  (1.473 m)    Weight:   Wt Readings from Last 1 Encounters:  12/15/18 91.1 kg    Ideal Body Weight:  43.9 kg  BMI:  Body mass index is 41.98 kg/m.  Estimated Nutritional Needs:   Kcal:  5859-2924  Protein:  90-110 gm  Fluid:  >/= 1.5 L    Molli Barrows, RD, LDN, [place] Pager  [phone] After Hours Pager [phone]

## 2018-12-15 NOTE — Progress Notes (Signed)
Peripherally Inserted Central Catheter/Midline Placement  The IV Nurse has discussed with the patient and/or persons authorized to consent for the patient, the purpose of this procedure and the potential benefits and risks involved with this procedure.  The benefits include less needle sticks, lab draws from the catheter, and the patient may be discharged home with the catheter. Risks include, but not limited to, infection, bleeding, blood clot (thrombus formation), and puncture of an artery; nerve damage and irregular heartbeat and possibility to perform a PICC exchange if needed/ordered by physician.  Alternatives to this procedure were also discussed.  Bard Power PICC patient education guide, fact sheet on infection prevention and patient information card has been provided to patient /or left at bedside.    PICC/Midline Placement Documentation  PICC Double Lumen 59/09/31 PICC Right Basilic (Active)     Consent obtained via phone between Park Pope (husband), Fredrik Cove RN and Colin Ina RN  Marianna Payment M 12/15/2018, 1:48 PM

## 2018-12-16 LAB — BPAM FFP
Blood Product Expiration Date: 202007052359
ISSUE DATE / TIME: 202006301210
Unit Type and Rh: 6200

## 2018-12-16 LAB — PREPARE FRESH FROZEN PLASMA: Unit division: 0

## 2018-12-16 LAB — COMPREHENSIVE METABOLIC PANEL
ALT: 30 U/L (ref 0–44)
AST: 33 U/L (ref 15–41)
Albumin: 2.4 g/dL — ABNORMAL LOW (ref 3.5–5.0)
Alkaline Phosphatase: 68 U/L (ref 38–126)
Anion gap: 10 (ref 5–15)
BUN: 53 mg/dL — ABNORMAL HIGH (ref 8–23)
CO2: 21 mmol/L — ABNORMAL LOW (ref 22–32)
Calcium: 7.9 mg/dL — ABNORMAL LOW (ref 8.9–10.3)
Chloride: 110 mmol/L (ref 98–111)
Creatinine, Ser: 1.61 mg/dL — ABNORMAL HIGH (ref 0.44–1.00)
GFR calc Af Amer: 36 mL/min — ABNORMAL LOW (ref >60)
GFR calc non Af Amer: 31 mL/min — ABNORMAL LOW (ref >60)
Glucose, Bld: 212 mg/dL — ABNORMAL HIGH (ref 70–99)
Potassium: 4.2 mmol/L (ref 3.5–5.1)
Sodium: 141 mmol/L (ref 135–145)
Total Bilirubin: 0.3 mg/dL (ref 0.3–1.2)
Total Protein: 5.3 g/dL — ABNORMAL LOW (ref 6.5–8.1)

## 2018-12-16 LAB — C-REACTIVE PROTEIN: CRP: 6.7 mg/dL — ABNORMAL HIGH (ref <1.0)

## 2018-12-16 LAB — CBC WITH DIFFERENTIAL/PLATELET
Abs Immature Granulocytes: 0.06 10*3/uL (ref 0.00–0.07)
Basophils Absolute: 0 10*3/uL (ref 0.0–0.1)
Basophils Relative: 0 %
Eosinophils Absolute: 0 10*3/uL (ref 0.0–0.5)
Eosinophils Relative: 0 %
HCT: 34.8 % — ABNORMAL LOW (ref 36.0–46.0)
Hemoglobin: 11 g/dL — ABNORMAL LOW (ref 12.0–15.0)
Immature Granulocytes: 1 %
Lymphocytes Relative: 6 %
Lymphs Abs: 0.4 10*3/uL — ABNORMAL LOW (ref 0.7–4.0)
MCH: 30.2 pg (ref 26.0–34.0)
MCHC: 31.6 g/dL (ref 30.0–36.0)
MCV: 95.6 fL (ref 80.0–100.0)
Monocytes Absolute: 0.5 10*3/uL (ref 0.1–1.0)
Monocytes Relative: 6 %
Neutro Abs: 6.3 10*3/uL (ref 1.7–7.7)
Neutrophils Relative %: 87 %
Platelets: 205 10*3/uL (ref 150–400)
RBC: 3.64 MIL/uL — ABNORMAL LOW (ref 3.87–5.11)
RDW: 14.6 % (ref 11.5–15.5)
WBC: 7.2 10*3/uL (ref 4.0–10.5)
nRBC: 0 % (ref 0.0–0.2)

## 2018-12-16 LAB — TRIGLYCERIDES: Triglycerides: 174 mg/dL — ABNORMAL HIGH (ref <150)

## 2018-12-16 LAB — FERRITIN: Ferritin: 248 ng/mL (ref 11–307)

## 2018-12-16 LAB — GLUCOSE, CAPILLARY
Glucose-Capillary: 225 mg/dL — ABNORMAL HIGH (ref 70–99)
Glucose-Capillary: 168 mg/dL — ABNORMAL HIGH (ref 70–99)
Glucose-Capillary: 182 mg/dL — ABNORMAL HIGH (ref 70–99)
Glucose-Capillary: 195 mg/dL — ABNORMAL HIGH (ref 70–99)
Glucose-Capillary: 239 mg/dL — ABNORMAL HIGH (ref 70–99)
Glucose-Capillary: 211 mg/dL — ABNORMAL HIGH (ref 70–99)

## 2018-12-16 LAB — BRAIN NATRIURETIC PEPTIDE
B Natriuretic Peptide: 264.1 pg/mL — ABNORMAL HIGH (ref 0.0–100.0)
B Natriuretic Peptide: 240.7 pg/mL — ABNORMAL HIGH (ref 0.0–100.0)

## 2018-12-16 LAB — MAGNESIUM: Magnesium: 2.1 mg/dL (ref 1.7–2.4)

## 2018-12-16 LAB — D-DIMER, QUANTITATIVE: D-Dimer, Quant: 2.03 ug/mL-FEU — ABNORMAL HIGH (ref 0.00–0.50)

## 2018-12-16 LAB — PHOSPHORUS: Phosphorus: 2.8 mg/dL (ref 2.5–4.6)

## 2018-12-16 MED ORDER — FUROSEMIDE 10 MG/ML IJ SOLN
60.0000 mg | Freq: Once | INTRAMUSCULAR | Status: AC
  Administered 2018-12-16: 60 mg via INTRAVENOUS
  Filled 2018-12-16: qty 6

## 2018-12-16 MED ORDER — QUETIAPINE FUMARATE 50 MG PO TABS
50.0000 mg | ORAL_TABLET | Freq: Two times a day (BID) | ORAL | Status: DC
  Administered 2018-12-16 – 2018-12-21 (×11): 50 mg via ORAL
  Filled 2018-12-16 (×12): qty 1

## 2018-12-16 MED ORDER — DEXAMETHASONE SODIUM PHOSPHATE 10 MG/ML IJ SOLN
6.0000 mg | Freq: Two times a day (BID) | INTRAMUSCULAR | Status: AC
  Administered 2018-12-16 – 2018-12-25 (×18): 6 mg via INTRAVENOUS
  Filled 2018-12-16 (×18): qty 1

## 2018-12-16 MED ORDER — CLONAZEPAM 1 MG PO TABS
2.0000 mg | ORAL_TABLET | Freq: Two times a day (BID) | ORAL | Status: DC
  Administered 2018-12-16 (×2): 2 mg via ORAL
  Filled 2018-12-16 (×2): qty 2

## 2018-12-16 MED ORDER — FENTANYL 50 MCG/HR TD PT72
1.0000 | MEDICATED_PATCH | TRANSDERMAL | Status: DC
  Administered 2018-12-16 – 2018-12-19 (×2): 1 via TRANSDERMAL
  Filled 2018-12-16 (×2): qty 1

## 2018-12-16 NOTE — Progress Notes (Signed)
MD attempted to place patient on CPAP/ PSV for patient synchrony with vent.  He placed patient on C/PS PS8, PEEP 12.  Patient tolerated ok with RR 10-14 with hopes RR will increase with decrease in sedation.  After approximately 30-45 minutes after changes, RR 8-10.  Placed patient back on PCV with previous settings.  Will continue to monitor.

## 2018-12-16 NOTE — Progress Notes (Signed)
NAME:  Jody Taylor, MRN:  [id number], DOB:  [birth year], LOS: 2 ADMISSION DATE:  11/22/2018, CONSULTATION DATE:  11/20/2018 REFERRING MD:  Sloan Leiter, CHIEF COMPLAINT:  Dyspnea   Brief History   78 year old woman with a history of diastolic heart failure admitted on June 29 for ARDS in the setting of COVID-19 pneumonia.  Required intubation at [facility] emergency room.  Past Medical History  Diastolic heart failure History of stroke (mini stroke) Obstructive sleep apnea on CPAP Hypertension Hyperlipidemia Coronary artery disease, had PCI in 4098 Chronic diastolic heart failure Chronic kidney disease GERD Allergic rhinitis  Significant Hospital Events   June 29 admission  Consults:  Pulmonary and critical care medicine  Procedures:  June 29 endotracheal tube> June 29 right internal jugular central venous line>   Significant Diagnostic Tests:  CXR 6/30: bilateral fine interstitial infiltrates. (personally reviewed)   Micro Data:  June 26 SARS-CoV-2 positive  Antimicrobials:  June 29 remdesivir June 29 Actemra June 29 solumedrol June 29 convalescent plasma   Interim history/subjective:  Agitation and ventilator dyssynchrony has limited sedation and ventilator weaning.   Objective   Blood pressure 122/78, pulse 73, temperature (!) 97.1 F (36.2 C), temperature source Axillary, resp. rate (!) 34, height 4\' 10"  (1.473 m), weight 94.1 kg, SpO2 (!) 88 %. CVP:  [7 mmHg-15 mmHg] 7 mmHg  Vent Mode: PCV FiO2 (%):  [50 %-100 %] 100 % Set Rate:  [26 bmp] 26 bmp PEEP:  [12 cmH20] 12 cmH20 Plateau Pressure:  [19 cmH20-28 cmH20] 22 cmH20   Intake/Output Summary (Last 24 hours) at 12/16/2018 0944 Last data filed at 12/16/2018 0754 Gross per 24 hour  Intake 2175.36 ml  Output 930 ml  Net 1245.36 ml   Filed Weights   11/25/2018 1900 12/15/18 0450 12/16/18 0500  Weight: 90.4 kg 91.1 kg 94.1 kg    Examination:  General:  Elderly woman appears older than stated age.   HENT: ETT and OGT tube in place with no skin breakdown. Sclerae intact. PULM: CTA B, vent supported breathing CV: RRR, no mgr GI: BS+, soft, nontender MSK: normal bulk and tone Neuro: sedated on vent  I performed a CCM echocardiogram which showed normal LV size and systolic function. Asymmetric septal hypertrophy with no intracavitary gradient. Trace MR. Normal RV size and function, no significant TR, Unable to calculate RVSP. Indeterminate diastolic function.  No pericardial effusion. IVC not visualized.  Resolved Hospital Problem list     Assessment & Plan:   Critically ill due to ARDS requiring mechanical ventilation Relatively compliant lungs.  Continue lung protective ventilation.  Lung protection is limited by periods of dyssynchrony with variable tidal volume delivery. Avoid PRVC to prevent excessive volume delivery. Patient may be more comfortable on PSV.  Need to decrease sedation to allow adequate respiratory drive.  Critically ill due to agitated delirium from toxic metabolic encephalopathy requiring titration of sedative infusions to maintain synchrony and avoid desaturation. Transition to enteral sedation to limit respiratory suppression. Steroid psychosis may be contributing factor. Introduce enteral sedation Taper steroids.  COVID-19 pneumonia   Complete 5 days of remdesivir Complete 10 days of steroids.  Diastolic heart failure: Positive fluid balance may contribute to hypoxia. Diurese as able to maintain CVP 4 - ensure patient is appropriately leveled.  Check BNP Trial of diuresis. Maintain event fluids balance.  Baseline asthma?  On Fasenra as outpatient, unclear pulmonary function testing, notes from [facility] just inidcate allergic rhinitis and cough As needed albuterol  Best practice:  Diet: tube feeding Pain/Anxiety/Delirium protocol (if indicated): yes, target -2, Fentanyl, propofol infusions VAP protocol (if indicated): yes DVT  prophylaxis: lovenox GI prophylaxis: Pantoprazole for stress ulcer prophylaxis Glucose control: SSI Mobility: passive range of motion Code Status: full Family Communication: none bedside Disposition: remain in ICU  Labs   CBC: Recent Labs  Lab 12/03/2018 1850  12/15/18 0100 12/15/18 0447 12/15/18 0505 12/15/18 1501 12/16/18 0500  WBC 4.4  --   --   --  3.4*  --  7.2  NEUTROABS 3.9  --   --   --  2.9  --  6.3  HGB 12.2   < > 10.9* 10.5* 11.4* 9.9* 11.0*  HCT 36.8   < > 32.0* 31.0* 35.5* 29.0* 34.8*  MCV 93.6  --   --   --  94.9  --  95.6  PLT 171  --   --   --  180  --  205   < > = values in this interval not displayed.    Basic Metabolic Panel: Recent Labs  Lab 11/20/2018 1850  12/15/18 0100 12/15/18 0447 12/15/18 0505 12/15/18 1501 12/15/18 1850 12/16/18 0500  NA 135   < > 136 135 138 138  --  141  K 4.3   < > 3.7 4.3 3.7 3.6  --  4.2  CL 104  --   --   --  106  --   --  110  CO2 19*  --   --   --  22  --   --  21*  GLUCOSE 191*  --   --   --  230*  --   --  212*  BUN 30*  --   --   --  36*  --   --  53*  CREATININE 1.87*  --   --   --  1.81*  --   --  1.61*  CALCIUM 8.1*  --   --   --  7.9*  --   --  7.9*  MG 1.9  --   --   --  2.0  --  2.0 2.1  PHOS 3.3  --   --   --  3.0  --  2.9 2.8   < > = values in this interval not displayed.   GFR: Estimated Creatinine Clearance: 29.2 mL/min (A) (by C-G formula based on SCr of 1.61 mg/dL (H)). Recent Labs  Lab 11/22/2018 1850 12/15/18 0505 12/16/18 0500  PROCALCITON <0.10  --   --   WBC 4.4 3.4* 7.2    Liver Function Tests: Recent Labs  Lab 11/26/2018 1850 12/15/18 0505 12/16/18 0500  AST 46* 40 33  ALT 38 34 30  ALKPHOS 72 69 68  BILITOT 0.3 0.2* 0.3  PROT 6.0* 5.5* 5.3*  ALBUMIN 2.6* 2.4* 2.4*   No results for input(s): LIPASE, AMYLASE in the last 168 hours. No results for input(s): AMMONIA in the last 168 hours.  ABG    Component Value Date/Time   PHART 7.311 (L) 12/15/2018 1501   PCO2ART 41.5  12/15/2018 1501   PO2ART 76.0 (L) 12/15/2018 1501   HCO3 20.9 12/15/2018 1501   TCO2 22 12/15/2018 1501   ACIDBASEDEF 5.0 (H) 12/15/2018 1501   O2SAT 94.0 12/15/2018 1501     Coagulation Profile: No results for input(s): INR, PROTIME in the last 168 hours.  Cardiac Enzymes: No results for input(s): CKTOTAL, CKMB, CKMBINDEX, TROPONINI in the last 168 hours.  HbA1C: Hgb A1c MFr Bld  Date/Time Value Ref Range Status  11/08/2016 07:24 AM 5.6 4.8 - 5.6 % Final    Comment:    (NOTE)         Pre-diabetes: 5.7 - 6.4         Diabetes: >6.4         Glycemic control for adults with diabetes: <7.0     CBG: Recent Labs  Lab 12/15/18 1151 12/15/18 1604 12/15/18 2005 12/16/18 0407 12/16/18 0813  GLUCAP 215* 184* 190* 225* 168*   COVID-19 Labs  Recent Labs    11/19/2018 1850 12/15/18 0505 12/16/18 0500  DDIMER 1.55* 2.02* 2.03*  FERRITIN 257 305 248  LDH 333*  --   --   CRP 7.6* 9.9* 6.7*    No results found for: [place] Performed by: Kipp Brood   Total critical care time: 35 minutes  Critical care time was exclusive of separately billable procedures and treating other patients.  Critical care was necessary to treat or prevent imminent or life-threatening deterioration.  Critical care was time spent personally by me on the following activities: development of treatment plan with patient and/or surrogate as well as nursing, discussions with consultants, evaluation of patient's response to treatment, examination of patient, obtaining history from patient or surrogate, ordering and performing treatments and interventions, ordering and review of laboratory studies, ordering and review of radiographic studies, pulse oximetry, re-evaluation of patient's condition and participation in multidisciplinary rounds.  Kipp Brood, MD [facility] ICU Physician [place]  Pager: [phone] Mobile: [phone] After hours: [phone].

## 2018-12-16 NOTE — Progress Notes (Addendum)
Called patient's daughter, Margaretha Sheffield at 1800 and face timed with her. Allowed her to talk to patient as long as she needed. She was very grateful for the time with patient and thanked me for all that we are doing to help patient. Told her we would call and update her tomorrow on patient condition.

## 2018-12-16 NOTE — Progress Notes (Signed)
  Pharmacy Medication Storage Note  Storing home medications for Jody Taylor in pharmacy secured storage.   Medication storage bag number: [id number] and [id number]  Delivered to pharmacy @ 12:59 (time)   Medications will be returned to patient/caregiver upon discharge.   Jody Taylor, PharmD, BCPS Pager [phone] 12/16/2018 1:00 AM

## 2018-12-16 NOTE — Progress Notes (Signed)
PROGRESS NOTE                                                                                                                                                                                                             Patient Demographics:    Jody Taylor, is a 78 y.o. female, DOB - Oct 30, 1940, HQP:[id number]  Outpatient Primary MD for the patient is Nicoletta Dress, MD    LOS - 2  Admit date - 11/18/2018    CC - SOB     Brief Narrative  Jody Taylor is a 78 y.o. female with medical history significant of severe persistent bronchial asthma on Benralizumab every 8 weeks, CAD HTN, chronic diastolic heart failure, hypothyroidism, dyslipidemia- who had 6 to 8-day history of fever cough shortness of breath presented to [facility] ER was diagnosed with COVID-19 pneumonitis causing acute hypoxic respiratory failure, she was intubated in the ER and transferred to [facility].   Subjective:    Jody Taylor today is intubated - sedated, in no distress.   Assessment  & Plan :     1. Acute Hypoxic Resp. Failure due to Acute Covid 19 Viral Pneumonitis during the ongoing 2020 Covid 19 Pandemic -  Was intubated at [facility] - remains intubated, FiO2 requirements are more close to 60% but when she moves tends to desat prompting higher FiO2 levels but this is usually transient, stable peak and plateau with plateau around 22, due to some dyssynchrony initial mode has been PCV will defer vent management to PCCM.  For her COVID-19 pneumonitis she has already received maximal treatment which includes IV steroids, REMDESIVIR, Actemra along with convalescent plasma on the day of admission.  Clinically stable with some improvement in hypoxia and oxygen need.  IV steroids being continued for now.  Vent Mode: PCV FiO2 (%):  [50 %-100 %] 100 % Set Rate:  [26 bmp] 26 bmp PEEP:  [12 cmH20] 12 cmH20 Plateau Pressure:  [19 cmH20-28 cmH20] 22 cmH20  ABG       Component Value Date/Time   PHART 7.311 (L) 12/15/2018 1501   PCO2ART 41.5 12/15/2018 1501   PO2ART 76.0 (L) 12/15/2018 1501   HCO3 20.9 12/15/2018 1501   TCO2 22 12/15/2018 1501   ACIDBASEDEF 5.0 (H) 12/15/2018 1501   O2SAT 94.0 12/15/2018 1501    COVID-19 Labs  Recent  Labs    11/20/2018 1850 12/15/18 0505 12/16/18 0500  DDIMER 1.55* 2.02* 2.03*  FERRITIN 257 305 248  LDH 333*  --   --   CRP 7.6* 9.9* 6.7*    No results found for: SARSCOV2NAA   Hepatic Function Latest Ref Rng & Units 12/16/2018 12/15/2018 11/29/2018  Total Protein 6.5 - 8.1 g/dL 5.3(L) 5.5(L) 6.0(L)  Albumin 3.5 - 5.0 g/dL 2.4(L) 2.4(L) 2.6(L)  AST 15 - 41 U/L 33 40 46(H)  ALT 0 - 44 U/L 30 34 38  Alk Phosphatase 38 - 126 U/L 68 69 72  Total Bilirubin 0.3 - 1.2 mg/dL 0.3 0.2(L) 0.3        Component Value Date/Time   BNP 272.5 (H) 12/13/2018 1850      2.  Chronic kidney disease 3.  Baseline creatinine around 1.6.  Continue to monitor.  3.  CAD.  Stable no acute issues currently on aspirin home dose,  4.  Underlying history of asthma.  Currently no wheezing, takes  Benralizumab every 8 weeks, on Supportive care.  5.  GERD.  PPI.  6.  Dyslipidemia.  On statin.  7.  Hypothyroidism.  Continue home dose Synthroid.  8.  Feeding via OG tube, add free water.   9.  Steroid induced hyperglycemia.  ISS.  CBG (last 3)  Recent Labs    12/15/18 2005 12/16/18 0407 12/16/18 0813  GLUCAP 190* 225* 168*     Condition - Extremely Guarded  Family Communication  :  Updated son and husband 12/15/18  Code Status : Full  Diet : OG Tube  Diet Order    None       Disposition Plan  :  ICU  Consults  :  PCCM  Procedures  :    ETT 6/29 R.IJ - 6/29 Foley 6/29  PUD Prophylaxis : PPI  DVT Prophylaxis  :  Lovenox    Lab Results  Component Value Date   PLT 205 12/16/2018    Inpatient Medications  Scheduled Meds:  sodium chloride   Intravenous Once   aspirin  81 mg Per Tube Daily    chlorhexidine gluconate (MEDLINE KIT)  15 mL Mouth Rinse BID   Chlorhexidine Gluconate Cloth  6 each Topical Daily   enoxaparin (LOVENOX) injection  40 mg Subcutaneous Q12H   feeding supplement (PRO-STAT SUGAR FREE 64)  60 mL Per Tube Daily   feeding supplement (VITAL HIGH PROTEIN)  1,000 mL Per Tube Q24H   free water  200 mL Per Tube Q8H   insulin aspart  0-15 Units Subcutaneous Q4H   levothyroxine  50 mcg Intravenous Daily   mouth rinse  15 mL Mouth Rinse 10 times per day   methylPREDNISolone (SOLU-MEDROL) injection  60 mg Intravenous Q12H   pantoprazole sodium  40 mg Per Tube Daily   sodium chloride flush  10-40 mL Intracatheter Q12H   sodium chloride flush  3 mL Intravenous Q12H   vitamin C  500 mg Per Tube Daily   zinc sulfate  220 mg Per Tube Daily   Continuous Infusions:  sodium chloride 50 mL/hr at 12/16/18 0400   fentaNYL infusion INTRAVENOUS 100 mcg/hr (12/16/18 0822)   propofol (DIPRIVAN) infusion 25 mcg/kg/min (12/16/18 0816)   remdesivir 100 mg in NS 250 mL Stopped (12/15/18 2058)   PRN Meds:.acetaminophen, fentaNYL, hydrALAZINE, ipratropium-albuterol, nitroGLYCERIN, [DISCONTINUED] ondansetron **OR** ondansetron (ZOFRAN) IV, polyethylene glycol, sodium chloride flush  Antibiotics  :    Anti-infectives (From admission, onward)   Start  Dose/Rate Route Frequency Ordered Stop   12/15/18 2000  remdesivir 100 mg in sodium chloride 0.9 % 250 mL IVPB     100 mg 500 mL/hr over 30 Minutes Intravenous Every 24 hours 11/29/2018 1845 12/19/18 1959   11/20/2018 2000  remdesivir 200 mg in sodium chloride 0.9 % 250 mL IVPB     200 mg 500 mL/hr over 30 Minutes Intravenous Once 11/29/2018 1845 11/29/2018 2116       Time Spent in minutes  30   Lala Lund M.D on 12/16/2018 at 10:09 AM  To page go to [email or web address] - password [facility]  [facility] -  Office  [phone]  See all Orders from today for further details    Objective:   Vitals:   12/16/18  0500 12/16/18 0700 12/16/18 0754 12/16/18 0808  BP: (!) 102/56 124/64 124/64 122/78  Pulse: 67 67  73  Resp:    (!) 34  Temp:   (!) 97.1 F (36.2 C)   TempSrc:   Axillary   SpO2: 91% 93%  (!) 88%  Weight: 94.1 kg     Height:        Wt Readings from Last 3 Encounters:  12/16/18 94.1 kg  03/02/18 95.5 kg  01/13/18 95.7 kg     Intake/Output Summary (Last 24 hours) at 12/16/2018 1009 Last data filed at 12/16/2018 0754 Gross per 24 hour  Intake 2057.15 ml  Output 885 ml  Net 1172.15 ml     Physical Exam  Patient intubated - sedated, ETT, Foley, R PICC Line in palce [place].AT,PERRAL Supple Neck,No JVD, No cervical lymphadenopathy appriciated.  Symmetrical Chest wall movement, Good air movement bilaterally, CTAB RRR,No Gallops, Rubs or new Murmurs, No Parasternal Heave +ve B.Sounds, Abd Soft, No tenderness, No organomegaly appriciated, No rebound - guarding or rigidity. No Cyanosis, Clubbing or edema, No new Rash or bruise     Data Review:    CBC Recent Labs  Lab 11/28/2018 1850  12/15/18 0100 12/15/18 0447 12/15/18 0505 12/15/18 1501 12/16/18 0500  WBC 4.4  --   --   --  3.4*  --  7.2  HGB 12.2   < > 10.9* 10.5* 11.4* 9.9* 11.0*  HCT 36.8   < > 32.0* 31.0* 35.5* 29.0* 34.8*  PLT 171  --   --   --  180  --  205  MCV 93.6  --   --   --  94.9  --  95.6  MCH 31.0  --   --   --  30.5  --  30.2  MCHC 33.2  --   --   --  32.1  --  31.6  RDW 14.7  --   --   --  14.7  --  14.6  LYMPHSABS 0.2*  --   --   --  0.4*  --  0.4*  MONOABS 0.1  --   --   --  0.1  --  0.5  EOSABS 0.0  --   --   --  0.0  --  0.0  BASOSABS 0.0  --   --   --  0.0  --  0.0   < > = values in this interval not displayed.    Chemistries  Recent Labs  Lab 12/03/2018 1850  12/15/18 0100 12/15/18 0447 12/15/18 0505 12/15/18 1501 12/15/18 1850 12/16/18 0500  NA 135   < > 136 135 138 138  --  141  K 4.3   < > 3.7 4.3  3.7 3.6  --  4.2  CL 104  --   --   --  106  --   --  110  CO2 19*  --   --   --  22   --   --  21*  GLUCOSE 191*  --   --   --  230*  --   --  212*  BUN 30*  --   --   --  36*  --   --  53*  CREATININE 1.87*  --   --   --  1.81*  --   --  1.61*  CALCIUM 8.1*  --   --   --  7.9*  --   --  7.9*  MG 1.9  --   --   --  2.0  --  2.0 2.1  AST 46*  --   --   --  40  --   --  33  ALT 38  --   --   --  34  --   --  30  ALKPHOS 72  --   --   --  69  --   --  68  BILITOT 0.3  --   --   --  0.2*  --   --  0.3   < > = values in this interval not displayed.   ------------------------------------------------------------------------------------------------------------------ Recent Labs    12/15/18 0505 12/16/18 0500  TRIG 280* 174*    Lab Results  Component Value Date   HGBA1C 5.6 11/08/2016   ------------------------------------------------------------------------------------------------------------------ No results for input(s): TSH, T4TOTAL, T3FREE, THYROIDAB in the last 72 hours.  Invalid input(s): FREET3  Cardiac Enzymes No results for input(s): CKMB, TROPONINI, MYOGLOBIN in the last 168 hours.  Invalid input(s): CK ------------------------------------------------------------------------------------------------------------------    Component Value Date/Time   BNP 272.5 (H) 12/13/2018 1850    Micro Results Recent Results (from the past 240 hour(s))  MRSA PCR Screening     Status: None   Collection Time: 12/09/2018  5:57 PM   Specimen: Nasal Mucosa; Nasopharyngeal  Result Value Ref Range Status   MRSA by PCR NEGATIVE NEGATIVE Final    Comment:        The GeneXpert MRSA Assay (FDA approved for NASAL specimens only), is one component of a comprehensive MRSA colonization surveillance program. It is not intended to diagnose MRSA infection nor to guide or monitor treatment for MRSA infections. Performed at [facility], [place] [address], [place], [place] [address]     Radiology Reports Dg Abd 1 View  Result Date: 12/08/2018 CLINICAL DATA:   Orogastric tube placement. EXAM: ABDOMEN - 1 VIEW COMPARISON:  None FINDINGS: Orogastric tube tip is in the distal body of the stomach. No dilated bowel.  Previous lumbar fusion at L5-S1. IMPRESSION: OG tube tip is in the distal stomach. Electronically Signed   By: Lorriane Shire M.D.   On: 12/11/2018 20:47   Dg Chest Port 1 View  Result Date: 12/15/2018 CLINICAL DATA:  Pulmonary infiltrates. Endotracheally intubated. COVID-19. EXAM: PORTABLE CHEST 1 VIEW 10:25 a.m. COMPARISON:  12/15/2018 at 5:09 a.m. and 12/12/2018 FINDINGS: Endotracheal tube is in good position 4 cm above the carina. NG tube tip is below the diaphragm. Central line tip is in the superior vena cava just below the carina, unchanged. Extensive bilateral pulmonary infiltrates persist, slightly increased at the left base. No effusions.  No acute bone abnormality. IMPRESSION: Slight progression of pulmonary infiltrates at the left base. No other  change. Electronically Signed   By: Lorriane Shire M.D.   On: 12/15/2018 11:15   Portable Chest 1 View  Result Date: 12/15/2018 CLINICAL DATA:  Shortness of breath.  COVID-19. EXAM: PORTABLE CHEST 1 VIEW COMPARISON:  12/04/2018. FINDINGS: Patient is rotated to the right. Endotracheal tube tip noted just above the right mainstem bronchus. Retraction of approximately 2 cm suggested. NG tube noted with tip below left hemidiaphragm. Right IJ line stable position. Heart size stable. Diffuse severe bilat interstitial infiltrates are again noted. Persistent low lung volumes. No pleural effusion or pneumothorax. IMPRESSION: 1. Endotracheal tube tip is noted just above the right mainstem bronchus. Retraction of approximately 2 cm suggested. NG tube and right IJ line stable position. 2. Diffuse severe bilateral pulmonary interstitial infiltrates again noted. Persistent low lung volumes. Critical Value/emergent results were called by telephone at the time of interpretation on 12/15/2018 at 7:01 am to nurse Elmyra Ricks,  who verbally acknowledged these results. Electronically Signed   By: Marcello Moores  Register   On: 12/15/2018 07:02   Dg Chest Port 1 View  Result Date: 12/13/2018 CLINICAL DATA:  Acute respiratory failure with hypoxia. EXAM: PORTABLE CHEST 1 VIEW COMPARISON:  December 14, 2018 FINDINGS: The endotracheal tube terminates above the carina by approximately 2.1 cm. The right-sided central venous catheter is well position. The enteric tube extends below the left hemidiaphragm. The heart size is enlarged. Aortic calcifications are noted. Again seen are diffuse bilateral hazy airspace opacities with some significant improvement from prior study. This may be in part due to approved imaging technique. There is no definite pneumothorax. There are likely small bilateral pleural effusions. IMPRESSION: 1. Lines and tubes as above. 2. Persistent but slightly improved multifocal airspace opacities which can be seen in patients with pulmonary edema or an atypical infectious process. Electronically Signed   By: Constance Holster M.D.   On: 12/07/2018 19:07   [place] Ekg Site Rite  Result Date: 11/29/2018 If Site Rite image not attached, placement could not be confirmed due to current cardiac rhythm.

## 2018-12-16 NOTE — Progress Notes (Signed)
RT attempted to wean FIO2 to 90%. Due to desat, FIO2 increased back to 100%.

## 2018-12-16 NOTE — Progress Notes (Addendum)
Spoke to patient's daughter Janene Madeira at 60 and gave her update on patient. Answered all of her questions regarding patient treatment and care. She asked if we could face time with her and patient later. Told her I would set time up for 1800. Will contact her at scheduled time.

## 2018-12-16 NOTE — Procedures (Signed)
Cortrak  Person Inserting Tube:  Maylon Peppers C, RD Tube Type:  Cortrak - 43 inches Tube Location:  Left nare Initial Placement:  Stomach Secured by: Bridle Technique Used to Measure Tube Placement:  Documented cm marking at nare/ corner of mouth Cortrak Secured At:  67 cm    Cortrak Tube Team Note:  Consult received to place a Cortrak feeding tube.   No x-ray is required. RN may begin using tube.   If the tube becomes dislodged please keep the tube and contact the Cortrak team at [email or web address] (password [email or web address]) for replacement.  If after hours and replacement cannot be delayed, place a NG tube and confirm placement with an abdominal x-ray.    [place], [place], [place] Pager [phone] After Hours Pager

## 2018-12-16 NOTE — Progress Notes (Signed)
pts home meds have been sent to pharmacy. Phone and glasses security is picking up.

## 2018-12-16 DEATH — deceased

## 2018-12-17 ENCOUNTER — Ambulatory Visit: Payer: Self-pay

## 2018-12-17 LAB — D-DIMER, QUANTITATIVE: D-Dimer, Quant: 2.66 ug/mL-FEU — ABNORMAL HIGH (ref 0.00–0.50)

## 2018-12-17 LAB — CBC WITH DIFFERENTIAL/PLATELET
Abs Immature Granulocytes: 0.07 10*3/uL (ref 0.00–0.07)
Basophils Absolute: 0 10*3/uL (ref 0.0–0.1)
Basophils Relative: 0 %
Eosinophils Absolute: 0 10*3/uL (ref 0.0–0.5)
Eosinophils Relative: 0 %
HCT: 34.3 % — ABNORMAL LOW (ref 36.0–46.0)
Hemoglobin: 11.2 g/dL — ABNORMAL LOW (ref 12.0–15.0)
Immature Granulocytes: 1 %
Lymphocytes Relative: 7 %
Lymphs Abs: 0.5 10*3/uL — ABNORMAL LOW (ref 0.7–4.0)
MCH: 31.1 pg (ref 26.0–34.0)
MCHC: 32.7 g/dL (ref 30.0–36.0)
MCV: 95.3 fL (ref 80.0–100.0)
Monocytes Absolute: 0.5 10*3/uL (ref 0.1–1.0)
Monocytes Relative: 8 %
Neutro Abs: 5.3 10*3/uL (ref 1.7–7.7)
Neutrophils Relative %: 84 %
Platelets: 220 10*3/uL (ref 150–400)
RBC: 3.6 MIL/uL — ABNORMAL LOW (ref 3.87–5.11)
RDW: 14.4 % (ref 11.5–15.5)
WBC: 6.4 10*3/uL (ref 4.0–10.5)
nRBC: 0 % (ref 0.0–0.2)

## 2018-12-17 LAB — COMPREHENSIVE METABOLIC PANEL
ALT: 30 U/L (ref 0–44)
AST: 32 U/L (ref 15–41)
Albumin: 2.4 g/dL — ABNORMAL LOW (ref 3.5–5.0)
Alkaline Phosphatase: 72 U/L (ref 38–126)
Anion gap: 9 (ref 5–15)
BUN: 59 mg/dL — ABNORMAL HIGH (ref 8–23)
CO2: 27 mmol/L (ref 22–32)
Calcium: 7.6 mg/dL — ABNORMAL LOW (ref 8.9–10.3)
Chloride: 106 mmol/L (ref 98–111)
Creatinine, Ser: 1.44 mg/dL — ABNORMAL HIGH (ref 0.44–1.00)
GFR calc Af Amer: 41 mL/min — ABNORMAL LOW (ref >60)
GFR calc non Af Amer: 35 mL/min — ABNORMAL LOW (ref >60)
Glucose, Bld: 194 mg/dL — ABNORMAL HIGH (ref 70–99)
Potassium: 3.3 mmol/L — ABNORMAL LOW (ref 3.5–5.1)
Sodium: 142 mmol/L (ref 135–145)
Total Bilirubin: 0.2 mg/dL — ABNORMAL LOW (ref 0.3–1.2)
Total Protein: 5 g/dL — ABNORMAL LOW (ref 6.5–8.1)

## 2018-12-17 LAB — GLUCOSE, CAPILLARY
Glucose-Capillary: 183 mg/dL — ABNORMAL HIGH (ref 70–99)
Glucose-Capillary: 190 mg/dL — ABNORMAL HIGH (ref 70–99)
Glucose-Capillary: 198 mg/dL — ABNORMAL HIGH (ref 70–99)
Glucose-Capillary: 213 mg/dL — ABNORMAL HIGH (ref 70–99)
Glucose-Capillary: 226 mg/dL — ABNORMAL HIGH (ref 70–99)

## 2018-12-17 LAB — TRIGLYCERIDES: Triglycerides: 218 mg/dL — ABNORMAL HIGH (ref <150)

## 2018-12-17 LAB — C-REACTIVE PROTEIN: CRP: 4.1 mg/dL — ABNORMAL HIGH (ref <1.0)

## 2018-12-17 LAB — FERRITIN: Ferritin: 239 ng/mL (ref 11–307)

## 2018-12-17 MED ORDER — CLONAZEPAM 1 MG PO TABS
1.0000 mg | ORAL_TABLET | Freq: Two times a day (BID) | ORAL | Status: DC
  Administered 2018-12-17 – 2018-12-21 (×9): 1 mg via ORAL
  Filled 2018-12-17 (×10): qty 1

## 2018-12-17 MED ORDER — DEXMEDETOMIDINE HCL IN NACL 400 MCG/100ML IV SOLN
0.4000 ug/kg/h | INTRAVENOUS | Status: DC
  Administered 2018-12-17: 13:00:00 0.4 ug/kg/h via INTRAVENOUS
  Administered 2018-12-17: 0.7 ug/kg/h via INTRAVENOUS
  Administered 2018-12-18: 0.4 ug/kg/h via INTRAVENOUS
  Filled 2018-12-17 (×3): qty 100

## 2018-12-17 MED ORDER — FUROSEMIDE 10 MG/ML IJ SOLN
60.0000 mg | Freq: Two times a day (BID) | INTRAMUSCULAR | Status: AC
  Administered 2018-12-17 – 2018-12-19 (×6): 60 mg via INTRAVENOUS
  Filled 2018-12-17 (×6): qty 6

## 2018-12-17 MED ORDER — POTASSIUM CHLORIDE 10 MEQ/100ML IV SOLN
10.0000 meq | INTRAVENOUS | Status: AC
  Administered 2018-12-17 (×4): 10 meq via INTRAVENOUS
  Filled 2018-12-17 (×2): qty 100

## 2018-12-17 NOTE — Progress Notes (Signed)
NAME:  Jody Taylor, MRN:  [id number], DOB:  June 13, 1941, LOS: 3 ADMISSION DATE:  11/17/2018, CONSULTATION DATE:  11/21/2018 REFERRING MD:  Sloan Leiter, CHIEF COMPLAINT:  Dyspnea   Brief History   78 year old woman with a history of diastolic heart failure admitted on June 29 for ARDS in the setting of COVID-19 pneumonia.  Required intubation at [facility] emergency room.  Past Medical History  Diastolic heart failure History of stroke (mini stroke) Obstructive sleep apnea on CPAP Hypertension Hyperlipidemia Coronary artery disease, had PCI in 5465 Chronic diastolic heart failure Chronic kidney disease GERD Allergic rhinitis  Significant Hospital Events   June 29 admission  Consults:  Pulmonary and critical care medicine  Procedures:  June 29 endotracheal tube> June 29 right internal jugular central venous line>   Significant Diagnostic Tests:  CXR 6/30: bilateral fine interstitial infiltrates. (personally reviewed)   Micro Data:  June 26 SARS-CoV-2 positive  Antimicrobials:  June 29 remdesivir June 29 Actemra June 29 solumedrol June 29 convalescent plasma   Interim history/subjective:  Agitation and ventilator dyssynchrony continues to limit sedation and ventilator weaning.  At rest modest oxygen requirements, but prompt desaturation with even mild agitation.  Objective   Blood pressure 113/63, pulse 67, temperature 98.7 F (37.1 C), temperature source Axillary, resp. rate 15, height 4\' 10"  (1.473 m), weight 94.2 kg, SpO2 95 %. CVP:  [7 mmHg-10 mmHg] 10 mmHg  Vent Mode: PCV FiO2 (%):  [50 %-60 %] 50 % Set Rate:  [26 bmp] 26 bmp PEEP:  [12 cmH20] 12 cmH20 Plateau Pressure:  [20 cmH20-21 cmH20] 20 cmH20   Intake/Output Summary (Last 24 hours) at 12/17/2018 1200 Last data filed at 12/17/2018 0600 Gross per 24 hour  Intake 4149.66 ml  Output 2625 ml  Net 1524.66 ml   Filed Weights   12/15/18 0450 12/16/18 0500 12/17/18 0500  Weight: 91.1 kg 94.1 kg 94.2 kg     Examination:  General:  Elderly woman appears older than stated age.  HENT: ETT and OGT tube in place with no skin breakdown. Sclerae intact. PULM: CTA B, vent supported breathing CV: RRR, no mgr GI: BS+, soft, nontender MSK: normal bulk and tone Neuro: sedated on vent   Resolved Hospital Problem list     Assessment & Plan:   Critically ill due to ARDS requiring mechanical ventilation Relatively compliant lungs.  Continue lung protective ventilation.  Lung protection is limited by periods of dyssynchrony with variable tidal volume delivery. Avoid PRVC to prevent excessive volume delivery. Patient may be more comfortable on PSV.  More comfortable ventilator settings may reduce agitation. Continue to attempt to decrease sedation to allow adequate respiratory drive.  Critically ill due to agitated delirium from toxic metabolic encephalopathy requiring titration of sedative infusions to maintain synchrony and avoid desaturation. Transition to enteral sedation to limit respiratory suppression. Transition to dexmedetomidine based sedation as this may improve respiratory drive. Steroid psychosis may be contributing factor. Introduce enteral sedation Taper steroids.  COVID-19 pneumonia   Complete 5 days of remdesivir Complete 10 days of steroids.  Diastolic heart failure: Positive fluid balance may contribute to hypoxia. Diurese as able to maintain CVP 4 - ensure patient is appropriately leveled.  BNP remains only mildly elevated but patient is likely very preload sensitive given DHF with asymmetric septal hypertrophy. Continue  diuresis as blood pressure and renal function allow.  Possible Baseline asthma  On Fasenra as outpatient, unclear pulmonary function testing, notes from [facility] just inidcate allergic rhinitis and cough  As needed albuterol  Best practice:  Diet: tube feeding Pain/Anxiety/Delirium protocol (if indicated): yes, target -2, Fentanyl,  propofol infusions VAP protocol (if indicated): yes DVT prophylaxis: lovenox GI prophylaxis: Pantoprazole for stress ulcer prophylaxis Glucose control: SSI Mobility: passive range of motion Code Status: full Family Communication: none bedside Disposition: remain in ICU  Labs   CBC: Recent Labs  Lab 12/06/2018 1850  12/15/18 0447 12/15/18 0505 12/15/18 1501 12/16/18 0500 12/17/18 0455  WBC 4.4  --   --  3.4*  --  7.2 6.4  NEUTROABS 3.9  --   --  2.9  --  6.3 5.3  HGB 12.2   < > 10.5* 11.4* 9.9* 11.0* 11.2*  HCT 36.8   < > 31.0* 35.5* 29.0* 34.8* 34.3*  MCV 93.6  --   --  94.9  --  95.6 95.3  PLT 171  --   --  180  --  205 220   < > = values in this interval not displayed.    Basic Metabolic Panel: Recent Labs  Lab 11/27/2018 1850  12/15/18 0447 12/15/18 0505 12/15/18 1501 12/15/18 1850 12/16/18 0500 12/17/18 0455  NA 135   < > 135 138 138  --  141 142  K 4.3   < > 4.3 3.7 3.6  --  4.2 3.3*  CL 104  --   --  106  --   --  110 106  CO2 19*  --   --  22  --   --  21* 27  GLUCOSE 191*  --   --  230*  --   --  212* 194*  BUN 30*  --   --  36*  --   --  53* 59*  CREATININE 1.87*  --   --  1.81*  --   --  1.61* 1.44*  CALCIUM 8.1*  --   --  7.9*  --   --  7.9* 7.6*  MG 1.9  --   --  2.0  --  2.0 2.1  --   PHOS 3.3  --   --  3.0  --  2.9 2.8  --    < > = values in this interval not displayed.   GFR: Estimated Creatinine Clearance: 32.6 mL/min (A) (by C-G formula based on SCr of 1.44 mg/dL (H)). Recent Labs  Lab 12/07/2018 1850 12/15/18 0505 12/16/18 0500 12/17/18 0455  PROCALCITON <0.10  --   --   --   WBC 4.4 3.4* 7.2 6.4    Liver Function Tests: Recent Labs  Lab 12/07/2018 1850 12/15/18 0505 12/16/18 0500 12/17/18 0455  AST 46* 40 33 32  ALT 38 34 30 30  ALKPHOS 72 69 68 72  BILITOT 0.3 0.2* 0.3 0.2*  PROT 6.0* 5.5* 5.3* 5.0*  ALBUMIN 2.6* 2.4* 2.4* 2.4*   No results for input(s): LIPASE, AMYLASE in the last 168 hours. No results for input(s): AMMONIA in  the last 168 hours.  ABG    Component Value Date/Time   PHART 7.311 (L) 12/15/2018 1501   PCO2ART 41.5 12/15/2018 1501   PO2ART 76.0 (L) 12/15/2018 1501   HCO3 20.9 12/15/2018 1501   TCO2 22 12/15/2018 1501   ACIDBASEDEF 5.0 (H) 12/15/2018 1501   O2SAT 94.0 12/15/2018 1501     Coagulation Profile: No results for input(s): INR, PROTIME in the last 168 hours.  Cardiac Enzymes: No results for input(s): CKTOTAL, CKMB, CKMBINDEX, TROPONINI in the last 168 hours.  HbA1C: Hgb A1c MFr  Bld  Date/Time Value Ref Range Status  11/08/2016 07:24 AM 5.6 4.8 - 5.6 % Final    Comment:    (NOTE)         Pre-diabetes: 5.7 - 6.4         Diabetes: >6.4         Glycemic control for adults with diabetes: <7.0     CBG: Recent Labs  Lab 12/16/18 1552 12/16/18 2023 12/17/18 0028 12/17/18 0359 12/17/18 0758  GLUCAP 239* 211* 183* 213* 198*   COVID-19 Labs  Recent Labs    11/20/2018 1850 12/15/18 0505 12/16/18 0500 12/17/18 0415 12/17/18 0455  DDIMER 1.55* 2.02* 2.03*  --  2.66*  FERRITIN 257 305 248 239  --   LDH 333*  --   --   --   --   CRP 7.6* 9.9* 6.7* 4.1*  --     No results found for: SARSCOV2NAA   BNP (last 3 results) Recent Labs    12/02/2018 1850 12/16/18 0500 12/16/18 1300  BNP 272.5* 264.1* 240.7*    ProBNP (last 3 results) No results for input(s): PROBNP in the last 8760 hours.    CRITICAL CARE Performed by: Kipp Brood   Total critical care time: 35 minutes  Critical care time was exclusive of separately billable procedures and treating other patients.  Critical care was necessary to treat or prevent imminent or life-threatening deterioration.  Critical care was time spent personally by me on the following activities: development of treatment plan with patient and/or surrogate as well as nursing, discussions with consultants, evaluation of patient's response to treatment, examination of patient, obtaining history from patient or surrogate, ordering  and performing treatments and interventions, ordering and review of laboratory studies, ordering and review of radiographic studies, pulse oximetry, re-evaluation of patient's condition and participation in multidisciplinary rounds.  Kipp Brood, MD [facility] ICU Physician [place]  Pager: [phone] Mobile: [phone] After hours: [phone].

## 2018-12-17 NOTE — Progress Notes (Signed)
Inpatient Diabetes Program Recommendations  AACE/ADA: New Consensus Statement on Inpatient Glycemic Control (2015)  Target Ranges:  Prepandial:   less than 140 mg/dL      Peak postprandial:   less than 180 mg/dL (1-2 hours)      Critically ill patients:  140 - 180 mg/dL   Lab Results  Component Value Date   GLUCAP 190 (H) 12/17/2018   HGBA1C 5.6 11/08/2016    Review of Glycemic Control Results for Jody Taylor, Jody Taylor (MRN [id number]) as of 12/17/2018 15:32  Ref. Range 12/16/2018 20:23 12/17/2018 00:28 12/17/2018 03:59 12/17/2018 07:58 12/17/2018 12:28  Glucose-Capillary Latest Ref Range: 70 - 99 mg/dL 211 (H) 183 (H) 213 (H) 198 (H) 190 (H)   Diabetes history: None Outpatient Diabetes medications:  None Current orders for Inpatient glycemic control:  Novolog moderate q 4 hours  Inpatient Diabetes Program Recommendations:    Blood sugars> goal with steroids.  Consider adding Levemir 8 units bid.   Thanks  Adah Perl, RN, BC-ADM Inpatient Diabetes Coordinator Pager [phone] (8a-5p)

## 2018-12-17 NOTE — Progress Notes (Signed)
PROGRESS NOTE                                                                                                                                                                                                             Patient Demographics:    Jody Taylor, is a 78 y.o. female, DOB - [birth year], CBU:[id number]  Outpatient Primary MD for the patient is Nicoletta Dress, MD    LOS - 3  Admit date - 12/13/2018    CC - SOB     Brief Narrative  Jody Taylor is a 78 y.o. female with medical history significant of severe persistent bronchial asthma on Benralizumab every 8 weeks, CAD HTN, chronic diastolic heart failure, hypothyroidism, dyslipidemia- who had 6 to 8-day history of fever cough shortness of breath presented to [facility] ER was diagnosed with COVID-19 pneumonitis causing acute hypoxic respiratory failure, she was intubated in the ER and transferred to [facility].   Subjective:    Jody Taylor today is intubated - sedated, in no distress.   Assessment  & Plan :     1. Acute Hypoxic Resp. Failure due to Acute Covid 19 Viral Pneumonitis during the ongoing 2020 Covid 19 Pandemic -  Was intubated at [facility] - remains intubated, FiO2 requirements are more close to 60% but when she moves tends to desat prompting higher FiO2 levels but this is usually transient, stable peak and plateau with plateau around 22, due to some dyssynchrony initial mode has been PCV will defer vent management to PCCM.  For her COVID-19 pneumonitis she has already received maximal treatment which includes IV steroids, REMDESIVIR, Actemra along with convalescent plasma on the day of admission.  Clinically stable with some improvement in hypoxia and oxygen need.  IV steroids being continued for now.  Vent Mode: PCV FiO2 (%):  [50 %-100 %] 50 % Set Rate:  [26 bmp] 26 bmp PEEP:  [12 cmH20] 12 cmH20 Plateau Pressure:  [20 cmH20-21 cmH20] 20 cmH20  ABG       Component Value Date/Time   PHART 7.311 (L) 12/15/2018 1501   PCO2ART 41.5 12/15/2018 1501   PO2ART 76.0 (L) 12/15/2018 1501   HCO3 20.9 12/15/2018 1501   TCO2 22 12/15/2018 1501   ACIDBASEDEF 5.0 (H) 12/15/2018 1501   O2SAT 94.0 12/15/2018 1501    COVID-19 Labs  Recent  Labs    11/18/2018 1850 12/15/18 0505 12/16/18 0500 12/17/18 0415 12/17/18 0455  DDIMER 1.55* 2.02* 2.03*  --  2.66*  FERRITIN 257 305 248 239  --   LDH 333*  --   --   --   --   CRP 7.6* 9.9* 6.7* 4.1*  --     No results found for: SARSCOV2NAA   Hepatic Function Latest Ref Rng & Units 12/17/2018 12/16/2018 12/15/2018  Total Protein 6.5 - 8.1 g/dL 5.0(L) 5.3(L) 5.5(L)  Albumin 3.5 - 5.0 g/dL 2.4(L) 2.4(L) 2.4(L)  AST 15 - 41 U/L 32 33 40  ALT 0 - 44 U/L 30 30 34  Alk Phosphatase 38 - 126 U/L 72 68 69  Total Bilirubin 0.3 - 1.2 mg/dL 0.2(L) 0.3 0.2(L)        Component Value Date/Time   BNP 240.7 (H) 12/16/2018 1300      2.  Chronic kidney disease 3.  Baseline creatinine around 1.6.  Continue to monitor.  3.  CAD.  Stable no acute issues currently on aspirin home dose,  4.  Underlying history of asthma.  Currently no wheezing, takes  Benralizumab every 8 weeks, on Supportive care.  5.  GERD.  PPI.  6.  Dyslipidemia.  On statin.  7.  Hypothyroidism.  Continue home dose Synthroid.  8.  Feeding via OG tube, add free water.   9.  Hypokalemia.  Replaced.   10. Steroid induced hyperglycemia.  ISS.  CBG (last 3)  Recent Labs    12/17/18 0028 12/17/18 0359 12/17/18 0758  GLUCAP 183* 213* 198*     Condition - Extremely Guarded  Family Communication  :  Updated son and husband 12/15/18  Code Status : Full  Diet : OG Tube  Diet Order    None       Disposition Plan  :  ICU  Consults  :  PCCM  Procedures  :    ETT 6/29 R.IJ - 6/29, removed 6/29 Foley 6/29 Right arm PICC line 11/18/2018  PUD Prophylaxis : PPI  DVT Prophylaxis  :  Lovenox    Lab Results  Component Value Date    PLT 220 12/17/2018    Inpatient Medications  Scheduled Meds:  sodium chloride   Intravenous Once   aspirin  81 mg Per Tube Daily   chlorhexidine gluconate (MEDLINE KIT)  15 mL Mouth Rinse BID   Chlorhexidine Gluconate Cloth  6 each Topical Daily   clonazePAM  1 mg Oral BID   dexamethasone (DECADRON) injection  6 mg Intravenous Q12H   enoxaparin (LOVENOX) injection  40 mg Subcutaneous Q12H   feeding supplement (PRO-STAT SUGAR FREE 64)  60 mL Per Tube Daily   feeding supplement (VITAL HIGH PROTEIN)  1,000 mL Per Tube Q24H   fentaNYL  1 patch Transdermal Q72H   free water  200 mL Per Tube Q8H   insulin aspart  0-15 Units Subcutaneous Q4H   levothyroxine  50 mcg Intravenous Daily   mouth rinse  15 mL Mouth Rinse 10 times per day   pantoprazole sodium  40 mg Per Tube Daily   QUEtiapine  50 mg Oral BID   sodium chloride flush  10-40 mL Intracatheter Q12H   vitamin C  500 mg Per Tube Daily   zinc sulfate  220 mg Per Tube Daily   Continuous Infusions:  sodium chloride 50 mL/hr at 12/17/18 0600   fentaNYL infusion INTRAVENOUS 50 mcg/hr (12/17/18 0600)   potassium chloride 10 mEq (12/17/18 1057)  propofol (DIPRIVAN) infusion 10 mcg/kg/min (12/17/18 0600)   remdesivir 100 mg in NS 250 mL Stopped (12/16/18 2125)   PRN Meds:.acetaminophen, fentaNYL, hydrALAZINE, ipratropium-albuterol, nitroGLYCERIN, [DISCONTINUED] ondansetron **OR** ondansetron (ZOFRAN) IV, polyethylene glycol  Antibiotics  :    Anti-infectives (From admission, onward)   Start     Dose/Rate Route Frequency Ordered Stop   12/15/18 2000  remdesivir 100 mg in sodium chloride 0.9 % 250 mL IVPB     100 mg 500 mL/hr over 30 Minutes Intravenous Every 24 hours 12/08/2018 1845 12/19/18 1959   11/16/2018 2000  remdesivir 200 mg in sodium chloride 0.9 % 250 mL IVPB     200 mg 500 mL/hr over 30 Minutes Intravenous Once 11/28/2018 1845 11/17/2018 2116       Time Spent in minutes  30   Lala Lund M.D on  12/17/2018 at 10:58 AM  To page go to [email or web address] - password [facility]  [facility] -  Office  [phone]  See all Orders from today for further details    Objective:   Vitals:   12/17/18 0422 12/17/18 0500 12/17/18 0600 12/17/18 0739  BP: 128/76 120/77 121/76 113/63  Pulse: 63 68 (!) 56 67  Resp: (!) '29 18 15 15  ' Temp:      TempSrc:      SpO2: 96% 97% 94% 95%  Weight:  94.2 kg    Height:        Wt Readings from Last 3 Encounters:  12/17/18 94.2 kg  03/02/18 95.5 kg  01/13/18 95.7 kg     Intake/Output Summary (Last 24 hours) at 12/17/2018 1058 Last data filed at 12/17/2018 0600 Gross per 24 hour  Intake 4149.66 ml  Output 2625 ml  Net 1524.66 ml     Physical Exam  Patient intubated - sedated, ETT, Foley, R PICC Line in palce [place].AT,PERRAL Supple Neck,No JVD, No cervical lymphadenopathy appriciated.  Symmetrical Chest wall movement, Good air movement bilaterally, CTAB RRR,No Gallops, Rubs or new Murmurs, No Parasternal Heave +ve B.Sounds, Abd Soft, No tenderness, No organomegaly appriciated, No rebound - guarding or rigidity. No Cyanosis, Clubbing or edema, No new Rash or bruise    Data Review:    CBC Recent Labs  Lab 11/28/2018 1850  12/15/18 0447 12/15/18 0505 12/15/18 1501 12/16/18 0500 12/17/18 0455  WBC 4.4  --   --  3.4*  --  7.2 6.4  HGB 12.2   < > 10.5* 11.4* 9.9* 11.0* 11.2*  HCT 36.8   < > 31.0* 35.5* 29.0* 34.8* 34.3*  PLT 171  --   --  180  --  205 220  MCV 93.6  --   --  94.9  --  95.6 95.3  MCH 31.0  --   --  30.5  --  30.2 31.1  MCHC 33.2  --   --  32.1  --  31.6 32.7  RDW 14.7  --   --  14.7  --  14.6 14.4  LYMPHSABS 0.2*  --   --  0.4*  --  0.4* 0.5*  MONOABS 0.1  --   --  0.1  --  0.5 0.5  EOSABS 0.0  --   --  0.0  --  0.0 0.0  BASOSABS 0.0  --   --  0.0  --  0.0 0.0   < > = values in this interval not displayed.    [place]  Lab 11/22/2018 1850  12/15/18 0447 12/15/18 0505 12/15/18 1501 12/15/18 1850  12/16/18 0500 12/17/18 0455  NA 135   < > 135 138 138  --  141 142  K 4.3   < > 4.3 3.7 3.6  --  4.2 3.3*  CL 104  --   --  106  --   --  110 106  CO2 19*  --   --  22  --   --  21* 27  GLUCOSE 191*  --   --  230*  --   --  212* 194*  BUN 30*  --   --  36*  --   --  53* 59*  CREATININE 1.87*  --   --  1.81*  --   --  1.61* 1.44*  CALCIUM 8.1*  --   --  7.9*  --   --  7.9* 7.6*  MG 1.9  --   --  2.0  --  2.0 2.1  --   AST 46*  --   --  40  --   --  33 32  ALT 38  --   --  34  --   --  30 30  ALKPHOS 72  --   --  69  --   --  68 72  BILITOT 0.3  --   --  0.2*  --   --  0.3 0.2*   < > = values in this interval not displayed.   ------------------------------------------------------------------------------------------------------------------ Recent Labs    12/16/18 0500 12/17/18 0455  TRIG 174* 218*    Lab Results  Component Value Date   HGBA1C 5.6 11/08/2016   ------------------------------------------------------------------------------------------------------------------ No results for input(s): TSH, T4TOTAL, T3FREE, THYROIDAB in the last 72 hours.  Invalid input(s): FREET3  Cardiac Enzymes No results for input(s): CKMB, TROPONINI, MYOGLOBIN in the last 168 hours.  Invalid input(s): CK ------------------------------------------------------------------------------------------------------------------    Component Value Date/Time   BNP 240.7 (H) 12/16/2018 1300    Micro Results Recent Results (from the past 240 hour(s))  MRSA PCR Screening     Status: None   Collection Time: 12/02/2018  5:57 PM   Specimen: Nasal Mucosa; Nasopharyngeal  Result Value Ref Range Status   MRSA by PCR NEGATIVE NEGATIVE Final    Comment:        The GeneXpert MRSA Assay (FDA approved for NASAL specimens only), is one component of a comprehensive MRSA colonization surveillance program. It is not intended to diagnose MRSA infection nor to guide or monitor treatment for MRSA  infections. Performed at [facility], [place] [address] [place], [place] [address]     Radiology Reports Dg Abd 1 View  Result Date: 12/09/2018 CLINICAL DATA:  Orogastric tube placement. EXAM: ABDOMEN - 1 VIEW COMPARISON:  None FINDINGS: Orogastric tube tip is in the distal body of the stomach. No dilated bowel.  Previous lumbar fusion at L5-S1. IMPRESSION: OG tube tip is in the distal stomach. Electronically Signed   By: Lorriane Shire M.D.   On: 11/23/2018 20:47   Dg Chest Port 1 View  Result Date: 12/15/2018 CLINICAL DATA:  Pulmonary infiltrates. Endotracheally intubated. COVID-19. EXAM: PORTABLE CHEST 1 VIEW 10:25 a.m. COMPARISON:  12/15/2018 at 5:09 a.m. and 11/26/2018 FINDINGS: Endotracheal tube is in good position 4 cm above the carina. NG tube tip is below the diaphragm. Central line tip is in the superior vena cava just below the carina, unchanged. Extensive bilateral pulmonary infiltrates persist, slightly increased at the left base. No effusions.  No acute bone abnormality. IMPRESSION: Slight progression of pulmonary  infiltrates at the left base. No other change. Electronically Signed   By: Lorriane Shire M.D.   On: 12/15/2018 11:15   Portable Chest 1 View  Result Date: 12/15/2018 CLINICAL DATA:  Shortness of breath.  COVID-19. EXAM: PORTABLE CHEST 1 VIEW COMPARISON:  12/04/2018. FINDINGS: Patient is rotated to the right. Endotracheal tube tip noted just above the right mainstem bronchus. Retraction of approximately 2 cm suggested. NG tube noted with tip below left hemidiaphragm. Right IJ line stable position. Heart size stable. Diffuse severe bilat interstitial infiltrates are again noted. Persistent low lung volumes. No pleural effusion or pneumothorax. IMPRESSION: 1. Endotracheal tube tip is noted just above the right mainstem bronchus. Retraction of approximately 2 cm suggested. NG tube and right IJ line stable position. 2. Diffuse severe bilateral pulmonary  interstitial infiltrates again noted. Persistent low lung volumes. Critical Value/emergent results were called by telephone at the time of interpretation on 12/15/2018 at 7:01 am to nurse Elmyra Ricks, who verbally acknowledged these results. Electronically Signed   By: Marcello Moores  Register   On: 12/15/2018 07:02   Dg Chest Port 1 View  Result Date: 12/02/2018 CLINICAL DATA:  Acute respiratory failure with hypoxia. EXAM: PORTABLE CHEST 1 VIEW COMPARISON:  December 14, 2018 FINDINGS: The endotracheal tube terminates above the carina by approximately 2.1 cm. The right-sided central venous catheter is well position. The enteric tube extends below the left hemidiaphragm. The heart size is enlarged. Aortic calcifications are noted. Again seen are diffuse bilateral hazy airspace opacities with some significant improvement from prior study. This may be in part due to approved imaging technique. There is no definite pneumothorax. There are likely small bilateral pleural effusions. IMPRESSION: 1. Lines and tubes as above. 2. Persistent but slightly improved multifocal airspace opacities which can be seen in patients with pulmonary edema or an atypical infectious process. Electronically Signed   By: Constance Holster M.D.   On: 11/18/2018 19:07   [place] Ekg Site Rite  Result Date: 11/23/2018 If Site Rite image not attached, placement could not be confirmed due to current cardiac rhythm.

## 2018-12-17 NOTE — Care Management Important Message (Signed)
Important Message  Patient Details  Name: TAMMARA MASSING MRN: [id number] Date of Birth: [birth year]   Medicare Important Message Given:  Yes - Important Message mailed due to current National Emergency   Verbal consent obtained due to current National Emergency  Relationship to patient: Child Contact Name: Seward Meth Call Date: 12/17/18  Time: 1611 Phone: [phone] Outcome: Spoke with contact Important Message mailed to: Other (must enter comment)   Called for the husband spoke with adult daughter who gave verbal consent to sign for her mother.    Aviance [place]wood 12/17/2018, 4:12 PM

## 2018-12-17 NOTE — Progress Notes (Signed)
Pt 's daughter Margaretha Sheffield called and was updated. Her questions were answered and concerns addressed.

## 2018-12-17 NOTE — Progress Notes (Signed)
Spoke with son Ollen Gross and answered his questions. He is pleased with the care his mother is receiving. Will facetime later in the day.

## 2018-12-17 NOTE — Progress Notes (Signed)
pts daughter updated via phone on patients condition. All questions answered. Patient states she is very grateful for care being provided. Daughter states she will call in AM to schedule a time for "face time"

## 2018-12-18 ENCOUNTER — Inpatient Hospital Stay: Payer: Medicare HMO

## 2018-12-18 ENCOUNTER — Inpatient Hospital Stay (HOSPITAL_COMMUNITY): Payer: Medicare HMO

## 2018-12-18 DIAGNOSIS — I25119 Atherosclerotic heart disease of native coronary artery with unspecified angina pectoris: Secondary | ICD-10-CM

## 2018-12-18 LAB — COMPREHENSIVE METABOLIC PANEL
ALT: 36 U/L (ref 0–44)
AST: 32 U/L (ref 15–41)
Albumin: 2.5 g/dL — ABNORMAL LOW (ref 3.5–5.0)
Alkaline Phosphatase: 102 U/L (ref 38–126)
Anion gap: 10 (ref 5–15)
BUN: 67 mg/dL — ABNORMAL HIGH (ref 8–23)
CO2: 28 mmol/L (ref 22–32)
Calcium: 7.7 mg/dL — ABNORMAL LOW (ref 8.9–10.3)
Chloride: 105 mmol/L (ref 98–111)
Creatinine, Ser: 1.24 mg/dL — ABNORMAL HIGH (ref 0.44–1.00)
GFR calc Af Amer: 49 mL/min — ABNORMAL LOW (ref >60)
GFR calc non Af Amer: 42 mL/min — ABNORMAL LOW (ref >60)
Glucose, Bld: 234 mg/dL — ABNORMAL HIGH (ref 70–99)
Potassium: 3.6 mmol/L (ref 3.5–5.1)
Sodium: 143 mmol/L (ref 135–145)
Total Bilirubin: 0.4 mg/dL (ref 0.3–1.2)
Total Protein: 5.3 g/dL — ABNORMAL LOW (ref 6.5–8.1)

## 2018-12-18 LAB — CBC WITH DIFFERENTIAL/PLATELET
Abs Immature Granulocytes: 0.11 10*3/uL — ABNORMAL HIGH (ref 0.00–0.07)
Basophils Absolute: 0 10*3/uL (ref 0.0–0.1)
Basophils Relative: 0 %
Eosinophils Absolute: 0 10*3/uL (ref 0.0–0.5)
Eosinophils Relative: 0 %
HCT: 39.5 % (ref 36.0–46.0)
Hemoglobin: 12.8 g/dL (ref 12.0–15.0)
Immature Granulocytes: 2 %
Lymphocytes Relative: 9 %
Lymphs Abs: 0.5 10*3/uL — ABNORMAL LOW (ref 0.7–4.0)
MCH: 30.5 pg (ref 26.0–34.0)
MCHC: 32.4 g/dL (ref 30.0–36.0)
MCV: 94.3 fL (ref 80.0–100.0)
Monocytes Absolute: 0.5 10*3/uL (ref 0.1–1.0)
Monocytes Relative: 7 %
Neutro Abs: 5.2 10*3/uL (ref 1.7–7.7)
Neutrophils Relative %: 82 %
Platelets: 238 10*3/uL (ref 150–400)
RBC: 4.19 MIL/uL (ref 3.87–5.11)
RDW: 14.2 % (ref 11.5–15.5)
WBC: 6.3 10*3/uL (ref 4.0–10.5)
nRBC: 0 % (ref 0.0–0.2)

## 2018-12-18 LAB — POCT I-STAT 7, (LYTES, BLD GAS, ICA,H+H)
Acid-Base Excess: 3 mmol/L — ABNORMAL HIGH (ref 0.0–2.0)
Bicarbonate: 27.4 mmol/L (ref 20.0–28.0)
Calcium, Ion: 1.16 mmol/L (ref 1.15–1.40)
HCT: 36 % (ref 36.0–46.0)
Hemoglobin: 12.2 g/dL (ref 12.0–15.0)
O2 Saturation: 91 %
Patient temperature: 36.8
Potassium: 3.6 mmol/L (ref 3.5–5.1)
Sodium: 143 mmol/L (ref 135–145)
TCO2: 29 mmol/L (ref 22–32)
pCO2 arterial: 41.7 mmHg (ref 32.0–48.0)
pH, Arterial: 7.424 (ref 7.350–7.450)
pO2, Arterial: 58 mmHg — ABNORMAL LOW (ref 83.0–108.0)

## 2018-12-18 LAB — GLUCOSE, CAPILLARY
Glucose-Capillary: 169 mg/dL — ABNORMAL HIGH (ref 70–99)
Glucose-Capillary: 195 mg/dL — ABNORMAL HIGH (ref 70–99)
Glucose-Capillary: 219 mg/dL — ABNORMAL HIGH (ref 70–99)
Glucose-Capillary: 226 mg/dL — ABNORMAL HIGH (ref 70–99)
Glucose-Capillary: 230 mg/dL — ABNORMAL HIGH (ref 70–99)
Glucose-Capillary: 238 mg/dL — ABNORMAL HIGH (ref 70–99)
Glucose-Capillary: 239 mg/dL — ABNORMAL HIGH (ref 70–99)

## 2018-12-18 LAB — D-DIMER, QUANTITATIVE: D-Dimer, Quant: 3.44 ug/mL-FEU — ABNORMAL HIGH (ref 0.00–0.50)

## 2018-12-18 LAB — TRIGLYCERIDES: Triglycerides: 429 mg/dL — ABNORMAL HIGH (ref <150)

## 2018-12-18 LAB — FERRITIN: Ferritin: 197 ng/mL (ref 11–307)

## 2018-12-18 LAB — C-REACTIVE PROTEIN: CRP: 3.5 mg/dL — ABNORMAL HIGH (ref <1.0)

## 2018-12-18 LAB — MAGNESIUM: Magnesium: 1.9 mg/dL (ref 1.7–2.4)

## 2018-12-18 MED ORDER — NOREPINEPHRINE 4 MG/250ML-% IV SOLN
0.0000 ug/min | INTRAVENOUS | Status: DC
  Administered 2018-12-18: 2 ug/min via INTRAVENOUS
  Filled 2018-12-18: qty 250

## 2018-12-18 MED ORDER — INSULIN DETEMIR 100 UNIT/ML ~~LOC~~ SOLN
20.0000 [IU] | Freq: Every day | SUBCUTANEOUS | Status: DC
  Administered 2018-12-18 – 2018-12-31 (×14): 20 [IU] via SUBCUTANEOUS
  Filled 2018-12-18 (×15): qty 0.2

## 2018-12-18 MED ORDER — POTASSIUM CHLORIDE 20 MEQ PO PACK
40.0000 meq | PACK | Freq: Once | ORAL | Status: AC
  Administered 2018-12-18: 40 meq
  Filled 2018-12-18: qty 2

## 2018-12-18 MED ORDER — FENTANYL CITRATE (PF) 100 MCG/2ML IJ SOLN
25.0000 ug | Freq: Once | INTRAMUSCULAR | Status: AC
  Administered 2018-12-18: 25 ug via INTRAVENOUS

## 2018-12-18 MED ORDER — FENTANYL BOLUS VIA INFUSION
25.0000 ug | INTRAVENOUS | Status: DC | PRN
  Filled 2018-12-18: qty 25

## 2018-12-18 MED ORDER — PROPOFOL 1000 MG/100ML IV EMUL: 25.0000 ug/kg/min | INTRAVENOUS | Status: DC

## 2018-12-18 MED ORDER — MIDAZOLAM 50MG/50ML (1MG/ML) PREMIX INFUSION
0.5000 mg/h | INTRAVENOUS | Status: DC
  Administered 2018-12-18: 4 mg/h via INTRAVENOUS
  Administered 2018-12-18: 0.5 mg/h via INTRAVENOUS
  Administered 2018-12-19 – 2018-12-20 (×3): 5 mg/h via INTRAVENOUS
  Administered 2018-12-20: 2 mg/h via INTRAVENOUS
  Administered 2018-12-21: 5 mg/h via INTRAVENOUS
  Administered 2018-12-23 – 2018-12-25 (×4): 2 mg/h via INTRAVENOUS
  Administered 2018-12-26: 0.5 mg/h via INTRAVENOUS
  Administered 2018-12-28: 5 mg/h via INTRAVENOUS
  Filled 2018-12-18 (×14): qty 50

## 2018-12-18 MED ORDER — ARTIFICIAL TEARS OPHTHALMIC OINT
1.0000 "application " | TOPICAL_OINTMENT | Freq: Three times a day (TID) | OPHTHALMIC | Status: DC
  Administered 2018-12-18 – 2018-12-21 (×12): 1 via OPHTHALMIC
  Filled 2018-12-18: qty 3.5

## 2018-12-18 MED ORDER — FENTANYL 2500MCG IN NS 250ML (10MCG/ML) PREMIX INFUSION
25.0000 ug/h | INTRAVENOUS | Status: DC
  Administered 2018-12-18 – 2018-12-19 (×2): 300 ug/h via INTRAVENOUS
  Administered 2018-12-19: 125 ug/h via INTRAVENOUS
  Administered 2018-12-19: 300 ug/h via INTRAVENOUS
  Administered 2018-12-20: 275 ug/h via INTRAVENOUS
  Administered 2018-12-20: 300 ug/h via INTRAVENOUS
  Filled 2018-12-18 (×6): qty 250

## 2018-12-18 MED ORDER — VECURONIUM BROMIDE 10 MG IV SOLR
0.1000 mg/kg | INTRAVENOUS | Status: DC | PRN
  Administered 2018-12-18 – 2018-12-20 (×4): 9.2 mg via INTRAVENOUS
  Filled 2018-12-18 (×5): qty 10

## 2018-12-18 MED ORDER — VITAL HIGH PROTEIN PO LIQD
1000.0000 mL | ORAL | Status: DC
  Administered 2018-12-19 – 2018-12-30 (×11): 1000 mL
  Filled 2018-12-18 (×4): qty 1000

## 2018-12-18 NOTE — Progress Notes (Signed)
NAME:  Jody Taylor, MRN:  [id number], DOB:  02-03-1941, LOS: 4 ADMISSION DATE:  12/12/2018, CONSULTATION DATE:  11/30/2018 REFERRING MD:  Sloan Leiter, CHIEF COMPLAINT:  Dyspnea   Brief History   Female with a history of diastolic heart failure admitted on June 29 for ARDS in the setting of COVID-19 pneumonia.  Required intubation at [facility] emergency room.  Past Medical History  Diastolic heart failure History of stroke (mini stroke) Obstructive sleep apnea on CPAP Hypertension Hyperlipidemia Coronary artery disease, had PCI in 5009 Chronic diastolic heart failure Chronic kidney disease GERD Allergic rhinitis  Significant Hospital Events   June 29 admission July 2, severe vent dyssynchrony, multiple ventilator changes, pressure support, SIMV July 3, oxygenation worsening overnight, tidal volume 12cc/kg IBW on SIMV  Consults:  Pulmonary and critical care medicine  Procedures:  June 29 endotracheal tube> June 29 right internal jugular central venous line>   Significant Diagnostic Tests:    Micro Data:  June 26 SARS-CoV-2 positive  Antimicrobials:  June 29 remdesivir June 29 Actemra June 29 solumedrol June 29 convalescent plasma   Interim history/subjective:    oxygenation worsening overnight, tidal volume 12cc/kg IBW on SIMV  Objective   Blood pressure 125/75, pulse 68, temperature 98.4 F (36.9 C), temperature source Core, resp. rate 18, height 4\' 10"  (1.473 m), weight 91.9 kg, SpO2 96 %. CVP:  [10 mmHg] 10 mmHg  Vent Mode: SIMV/PC/PS FiO2 (%):  [70 %-80 %] 80 % Set Rate:  [12 bmp] 12 bmp PEEP:  [12 cmH20] 12 cmH20 Pressure Support:  [10 cmH20] 10 cmH20 Plateau Pressure:  [21 cmH20-29 cmH20] 29 cmH20   Intake/Output Summary (Last 24 hours) at 12/18/2018 0743 Last data filed at 12/18/2018 0600 Gross per 24 hour  Intake 3771.14 ml  Output 3950 ml  Net -178.86 ml   Filed Weights   12/16/18 0500 12/17/18 0500 12/18/18 0500  Weight: 94.1 kg 94.2 kg 91.9  kg    Examination:  General:  In bed on vent HENT: NCAT ETT in place PULM: Crackles bases B, vent supported breathing CV: RRR, no mgr GI: BS+, soft, nontender MSK: normal bulk and tone Neuro: sedated on vent  7/3 CXR images personally reviewed: Bilateral interstitial opacities left greater than right, cardiomegaly, support apparatus in place  Resolved Hospital Problem list     Assessment & Plan:  ARDS due to COVID-19 pneumonia: Oxygenation worsening in last 24 hours, presumably related to worsening ARDS Continue full mechanical ventilatory support with ARDS ventilator protocol Target tidal volume 7 cc/kg ideal body weight, start heavy sedation and then start volume targeted modality Start intermittent paralytic protocol, increase sedation given severe ventilator dyssynchrony Avoid high tidal volumes Adjust PEEP/FiO2 per ARDS table Target PaO2 is 55-65 Consider prone positioning Check ABG on adjusted ventilator settings Adjust RASS score to -4 with intermittent paralytic protocol.  Start Versed infusion, continue fentanyl infusion, intermittent vecuronium  Baseline asthma? on Fasenra as outpatient, unclear pulmonary function testing, notes from [facility] pulmonary just inidcate allergic rhinitis and cough Prn albuterol  Diastolic heart failure: Diurese as able Telemetry monitoring Continue to hold metoprolol and nitrates    Best practice:  Diet: tube feeding Pain/Anxiety/Delirium protocol (if indicated): as above VAP protocol (if indicated): yes DVT prophylaxis: lovenox GI prophylaxis: Pantoprazole for stress ulcer prophylaxis Glucose control: SSI Mobility: passive range of motion Code Status: full Family Communication: none bedside Disposition: remain in ICU  Labs   CBC: Recent Labs  Lab 11/30/2018 1850  12/15/18 0505 12/15/18 1501 12/16/18 0500  12/17/18 0455 12/18/18 0500  WBC 4.4  --  3.4*  --  7.2 6.4 6.3  NEUTROABS 3.9  --  2.9  --  6.3 5.3 5.2  HGB 12.2    < > 11.4* 9.9* 11.0* 11.2* 12.8  HCT 36.8   < > 35.5* 29.0* 34.8* 34.3* 39.5  MCV 93.6  --  94.9  --  95.6 95.3 94.3  PLT 171  --  180  --  205 220 238   < > = values in this interval not displayed.    Basic Metabolic Panel: Recent Labs  Lab 11/19/2018 1850  12/15/18 0505 12/15/18 1501 12/15/18 1850 12/16/18 0500 12/17/18 0455 12/18/18 0500  NA 135   < > 138 138  --  141 142 143  K 4.3   < > 3.7 3.6  --  4.2 3.3* 3.6  CL 104  --  106  --   --  110 106 105  CO2 19*  --  22  --   --  21* 27 28  GLUCOSE 191*  --  230*  --   --  212* 194* 234*  BUN 30*  --  36*  --   --  53* 59* 67*  CREATININE 1.87*  --  1.81*  --   --  1.61* 1.44* 1.24*  CALCIUM 8.1*  --  7.9*  --   --  7.9* 7.6* 7.7*  MG 1.9  --  2.0  --  2.0 2.1  --   --   PHOS 3.3  --  3.0  --  2.9 2.8  --   --    < > = values in this interval not displayed.   GFR: Estimated Creatinine Clearance: 37.4 mL/min (A) (by C-G formula based on SCr of 1.24 mg/dL (H)). Recent Labs  Lab 11/18/2018 1850 12/15/18 0505 12/16/18 0500 12/17/18 0455 12/18/18 0500  PROCALCITON <0.10  --   --   --   --   WBC 4.4 3.4* 7.2 6.4 6.3    Liver Function Tests: Recent Labs  Lab 12/13/2018 1850 12/15/18 0505 12/16/18 0500 12/17/18 0455 12/18/18 0500  AST 46* 40 33 32 32  ALT 38 34 30 30 36  ALKPHOS 72 69 68 72 102  BILITOT 0.3 0.2* 0.3 0.2* 0.4  PROT 6.0* 5.5* 5.3* 5.0* 5.3*  ALBUMIN 2.6* 2.4* 2.4* 2.4* 2.5*   No results for input(s): LIPASE, AMYLASE in the last 168 hours. No results for input(s): AMMONIA in the last 168 hours.  ABG    Component Value Date/Time   PHART 7.311 (L) 12/15/2018 1501   PCO2ART 41.5 12/15/2018 1501   PO2ART 76.0 (L) 12/15/2018 1501   HCO3 20.9 12/15/2018 1501   TCO2 22 12/15/2018 1501   ACIDBASEDEF 5.0 (H) 12/15/2018 1501   O2SAT 94.0 12/15/2018 1501     Coagulation Profile: No results for input(s): INR, PROTIME in the last 168 hours.  Cardiac Enzymes: No results for input(s): CKTOTAL, CKMB,  CKMBINDEX, TROPONINI in the last 168 hours.  HbA1C: Hgb A1c MFr Bld  Date/Time Value Ref Range Status  11/08/2016 07:24 AM 5.6 4.8 - 5.6 % Final    Comment:    (NOTE)         Pre-diabetes: 5.7 - 6.4         Diabetes: >6.4         Glycemic control for adults with diabetes: <7.0     CBG: Recent Labs  Lab 12/17/18 0758 12/17/18 1228 12/17/18 1702 12/17/18  2339 12/18/18 0426  GLUCAP 198* 190* 226* 238* 230*       Critical care time: 38 minutes     Roselie Awkward, MD [place] P[facility]M Pager: [phone] Cell: [phone] If no response, call [phone]

## 2018-12-18 NOTE — Progress Notes (Addendum)
Nutrition Follow-up RD working remotely.  DOCUMENTATION CODES:   Morbid obesity  INTERVENTION:   D/C Pro-stat  Increase Vital High Protein to 50 ml/h  Provides 1200 kcal, 105 gm protein, 1003 ml free water daily  NUTRITION DIAGNOSIS:   Inadequate oral intake related to inability to eat as evidenced by NPO status.  Ongoing   GOAL:   Provide needs based on ASPEN/SCCM guidelines  Met with TF  MONITOR:   Vent status, TF tolerance, I & O's, Labs  ASSESSMENT:   78 yo female admitted with acute respiratory failure r/t COVID pneumonitis requiring intubation. PMH of severe asthma, anemia, CAD, CHF, vitamin D deficiency, HTN, HLD, GERD, CKD-3.  Patient with worsening ARDS. May consider proning. Family wants to continue supportive care, but do not want to be any more aggressive if patient continues to decline.   Patient is currently intubated on ventilator support MV: 12.6 L/min Temp (24hrs), Avg:98.1 F (36.7 C), Min:97.7 F (36.5 C), Max:98.4 F (36.9 C)  Propofol: off  Cortrak tube placed 7/1; currently receiving Vital High Protein at 35 ml/h with Pro-stat 60 ml per day. Providing 100% of estimated needs. Free water flushes 200 ml every 8 hours.  Labs reviewed.  CBG's: [phone]  Medications reviewed and include Decadron, Lasix, Novolog, Levemir, vitamin C, and zinc.  IVF: NS at 50 ml/h  I/O net + 3.4 L  Diet Order:   Diet Order    None      EDUCATION NEEDS:   No education needs have been identified at this time  Skin:  Skin Assessment: Reviewed RN Assessment(MASD to abdominal skin folds)  Last BM:  7/2 (type7)  Height:   Ht Readings from Last 1 Encounters:  11/30/2018 '4\' 10"'  (1.473 m)    Weight:   Wt Readings from Last 1 Encounters:  12/18/18 91.9 kg    Ideal Body Weight:  43.9 kg  BMI:  Body mass index is 42.34 kg/m.  Estimated Nutritional Needs:   Kcal:  9223-0097  Protein:  90-110 gm  Fluid:  >/= 1.5 L    Molli Barrows,  RD, LDN, [place] Pager [phone] After Hours Pager [phone]

## 2018-12-18 NOTE — Progress Notes (Signed)
[facility] P[facility]M  Notified by nursing that BP dropped after changing sedation around CXR Levophed titrated to MAP > 65  Roselie Awkward, MD [place] Pager: [phone] Cell: [phone] If no response, call [phone]

## 2018-12-18 NOTE — Progress Notes (Signed)
PROGRESS NOTE                                                                                                                                                                                                             Patient Demographics:    Jody Taylor, is a 78 y.o. female, DOB - 1940-09-29, XBD:[id number]  Outpatient Primary MD for the patient is Nicoletta Dress, MD    LOS - 4  Admit date - 12/09/2018    CC - SOB     Brief Narrative  Jody Taylor is a 78 y.o. female with medical history significant of severe persistent bronchial asthma on Benralizumab every 8 weeks, CAD HTN, chronic diastolic heart failure, hypothyroidism, dyslipidemia- who had 6 to 8-day history of fever cough shortness of breath presented to [facility] ER was diagnosed with COVID-19 pneumonitis causing acute hypoxic respiratory failure, she was intubated in the ER and transferred to [facility].   Subjective:    Jody Taylor today is intubated - sedated, in no distress.   Assessment  & Plan :     1. Acute Hypoxic Resp. Failure due to Acute Covid 19 Viral Pneumonitis during the ongoing 2020 Covid 19 Pandemic -  Was intubated at [facility] - remains intubated, FiO2 requirements are more close to 60% but when she moves tends to desat prompting higher FiO2 levels but this is usually transient, stable peak and plateau with plateau around 22, due to some dyssynchrony initial mode has been PCV will defer vent management to PCCM.  For her COVID-19 pneumonitis she has already received maximal treatment which includes IV steroids, REMDESIVIR, Actemra along with convalescent plasma on the day of admission.  Clinically stable with some improvement in hypoxia and oxygen need.  IV steroids being continued for now.  Vent Mode: SIMV/PC/PS FiO2 (%):  [70 %-80 %] 80 % Set Rate:  [12 bmp] 12 bmp PEEP:  [12 cmH20] 12 cmH20 Pressure Support:  [10 cmH20] 10 cmH20 Plateau Pressure:   [21 cmH20-29 cmH20] 29 cmH20  ABG     Component Value Date/Time   PHART 7.311 (L) 12/15/2018 1501   PCO2ART 41.5 12/15/2018 1501   PO2ART 76.0 (L) 12/15/2018 1501   HCO3 20.9 12/15/2018 1501   TCO2 22 12/15/2018 1501   ACIDBASEDEF 5.0 (H) 12/15/2018 1501   O2SAT 94.0 12/15/2018 1501  COVID-19 Labs  Recent Labs    12/16/18 0500 12/17/18 0415 12/17/18 0455 12/18/18 0500  DDIMER 2.03*  --  2.66* 3.44*  FERRITIN 248 239  --  197  CRP 6.7* 4.1*  --  3.5*    No results found for: SARSCOV2NAA   Hepatic Function Latest Ref Rng & Units 12/18/2018 12/17/2018 12/16/2018  Total Protein 6.5 - 8.1 g/dL 5.3(L) 5.0(L) 5.3(L)  Albumin 3.5 - 5.0 g/dL 2.5(L) 2.4(L) 2.4(L)  AST 15 - 41 U/L 32 32 33  ALT 0 - 44 U/L 36 30 30  Alk Phosphatase 38 - 126 U/L 102 72 68  Total Bilirubin 0.3 - 1.2 mg/dL 0.4 0.2(L) 0.3        Component Value Date/Time   BNP 240.7 (H) 12/16/2018 1300      2.  Chronic kidney disease 3.  Baseline creatinine around 1.6.  Continue to monitor.  3.  CAD.  Stable no acute issues currently on aspirin home dose,  4.  Underlying history of asthma.  Currently no wheezing, takes  Benralizumab every 8 weeks, on Supportive care.  5.  GERD.  PPI.  6.  Dyslipidemia.  On statin.  7.  Hypothyroidism.  Continue home dose Synthroid.  8.  Feeding via OG tube, add free water.   9.  Hypokalemia.  Replaced and stable.   10. Steroid induced hyperglycemia.  ISS.  CBG (last 3)  Recent Labs    12/17/18 2339 12/18/18 0426 12/18/18 0811  GLUCAP 238* 230* 195*     Condition - Extremely Guarded  Family Communication  :  Updated son and husband 12/15/18, 12/18/18 - they want to continue present line of care but do not want to be any more aggressive if she declines further.  Code Status : DNR  Diet : OG Tube  Diet Order    None       Disposition Plan  :  ICU  Consults  :  PCCM  Procedures  :    ETT 6/29 R.IJ - 6/29, removed 6/29 Foley 6/29 Right arm PICC  line 12/02/2018  PUD Prophylaxis : PPI  DVT Prophylaxis  :  Lovenox    Lab Results  Component Value Date   PLT 238 12/18/2018    Inpatient Medications  Scheduled Meds:  sodium chloride   Intravenous Once   artificial tears  1 application Both Eyes S9F   aspirin  81 mg Per Tube Daily   chlorhexidine gluconate (MEDLINE KIT)  15 mL Mouth Rinse BID   Chlorhexidine Gluconate Cloth  6 each Topical Daily   clonazePAM  1 mg Oral BID   dexamethasone (DECADRON) injection  6 mg Intravenous Q12H   enoxaparin (LOVENOX) injection  40 mg Subcutaneous Q12H   feeding supplement (PRO-STAT SUGAR FREE 64)  60 mL Per Tube Daily   feeding supplement (VITAL HIGH PROTEIN)  1,000 mL Per Tube Q24H   fentaNYL  1 patch Transdermal Q72H   fentaNYL (SUBLIMAZE) injection  25 mcg Intravenous Once   free water  200 mL Per Tube Q8H   furosemide  60 mg Intravenous BID   insulin aspart  0-15 Units Subcutaneous Q4H   insulin detemir  20 Units Subcutaneous Daily   levothyroxine  50 mcg Intravenous Daily   mouth rinse  15 mL Mouth Rinse 10 times per day   pantoprazole sodium  40 mg Per Tube Daily   QUEtiapine  50 mg Oral BID   sodium chloride flush  10-40 mL Intracatheter Q12H   vitamin  C  500 mg Per Tube Daily   zinc sulfate  220 mg Per Tube Daily   Continuous Infusions:  sodium chloride 50 mL/hr at 12/18/18 0700   fentaNYL infusion INTRAVENOUS 50 mcg/hr (12/18/18 0852)   midazolam 0.5 mg/hr (12/18/18 0903)   propofol (DIPRIVAN) infusion Stopped (12/17/18 1316)   remdesivir 100 mg in NS 250 mL Stopped (12/17/18 2105)   PRN Meds:.acetaminophen, fentaNYL, hydrALAZINE, ipratropium-albuterol, nitroGLYCERIN, [DISCONTINUED] ondansetron **OR** ondansetron (ZOFRAN) IV, polyethylene glycol, vecuronium  Antibiotics  :    Anti-infectives (From admission, onward)   Start     Dose/Rate Route Frequency Ordered Stop   12/15/18 2000  remdesivir 100 mg in sodium chloride 0.9 % 250 mL IVPB      100 mg 500 mL/hr over 30 Minutes Intravenous Every 24 hours 12/01/2018 1845 12/19/18 1959   11/18/2018 2000  remdesivir 200 mg in sodium chloride 0.9 % 250 mL IVPB     200 mg 500 mL/hr over 30 Minutes Intravenous Once 11/26/2018 1845 12/12/2018 2116       Time Spent in minutes  30   Lala Lund M.D on 12/18/2018 at 10:30 AM  To page go to [email or web address] - password [email or web address]  [facility] -  Office  [phone]  See all Orders from today for further details    Objective:   Vitals:   12/18/18 0522 12/18/18 0600 12/18/18 0700 12/18/18 0804  BP: (!) 141/97 (!) 136/56 125/75 (!) 123/56  Pulse: (!) 53 61 68 64  Resp: '20 14 18 ' (!) 22  Temp:  98.2 F (36.8 C) 98.4 F (36.9 C) 98.2 F (36.8 C)  TempSrc:   Core   SpO2: 90% 95% 96% 92%  Weight:      Height:        Wt Readings from Last 3 Encounters:  12/18/18 91.9 kg  03/02/18 95.5 kg  01/13/18 95.7 kg     Intake/Output Summary (Last 24 hours) at 12/18/2018 1030 Last data filed at 12/18/2018 0700 Gross per 24 hour  Intake 3725.82 ml  Output 3790 ml  Net -64.18 ml     Physical Exam  Patient intubated - sedated  ETT, Foley, R PICC Line in palce [place].AT  Supple Neck,No JVD, No cervical lymphadenopathy appriciated.  Symmetrical Chest wall movement, Good air movement bilaterally, CTAB RRR,No Gallops, Rubs or new Murmurs, No Parasternal Heave +ve B.Sounds, Abd Soft,   No Cyanosis, Clubbing or edema, No new Rash or bruise    Data Review:    CBC Recent Labs  Lab 12/12/2018 1850  12/15/18 0505 12/15/18 1501 12/16/18 0500 12/17/18 0455 12/18/18 0500  WBC 4.4  --  3.4*  --  7.2 6.4 6.3  HGB 12.2   < > 11.4* 9.9* 11.0* 11.2* 12.8  HCT 36.8   < > 35.5* 29.0* 34.8* 34.3* 39.5  PLT 171  --  180  --  205 220 238  MCV 93.6  --  94.9  --  95.6 95.3 94.3  MCH 31.0  --  30.5  --  30.2 31.1 30.5  MCHC 33.2  --  32.1  --  31.6 32.7 32.4  RDW 14.7  --  14.7  --  14.6 14.4 14.2  LYMPHSABS 0.2*  --  0.4*  --  0.4* 0.5* 0.5*    MONOABS 0.1  --  0.1  --  0.5 0.5 0.5  EOSABS 0.0  --  0.0  --  0.0 0.0 0.0  BASOSABS 0.0  --  0.0  --  0.0 0.0  0.0   < > = values in this interval not displayed.    Chemistries  Recent Labs  Lab 12/06/2018 1850  12/15/18 0505 12/15/18 1501 12/15/18 1850 12/16/18 0500 12/17/18 0455 12/18/18 0500  NA 135   < > 138 138  --  141 142 143  K 4.3   < > 3.7 3.6  --  4.2 3.3* 3.6  CL 104  --  106  --   --  110 106 105  CO2 19*  --  22  --   --  21* 27 28  GLUCOSE 191*  --  230*  --   --  212* 194* 234*  BUN 30*  --  36*  --   --  53* 59* 67*  CREATININE 1.87*  --  1.81*  --   --  1.61* 1.44* 1.24*  CALCIUM 8.1*  --  7.9*  --   --  7.9* 7.6* 7.7*  MG 1.9  --  2.0  --  2.0 2.1  --  1.9  AST 46*  --  40  --   --  33 32 32  ALT 38  --  34  --   --  30 30 36  ALKPHOS 72  --  69  --   --  68 72 102  BILITOT 0.3  --  0.2*  --   --  0.3 0.2* 0.4   < > = values in this interval not displayed.   ------------------------------------------------------------------------------------------------------------------ Recent Labs    12/17/18 0455 12/18/18 0500  TRIG 218* 429*    Lab Results  Component Value Date   HGBA1C 5.6 11/08/2016   ------------------------------------------------------------------------------------------------------------------ No results for input(s): TSH, T4TOTAL, T3FREE, THYROIDAB in the last 72 hours.  Invalid input(s): FREET3  Cardiac Enzymes No results for input(s): CKMB, TROPONINI, MYOGLOBIN in the last 168 hours.  Invalid input(s): CK ------------------------------------------------------------------------------------------------------------------    Component Value Date/Time   BNP 240.7 (H) 12/16/2018 1300    Micro Results Recent Results (from the past 240 hour(s))  MRSA PCR Screening     Status: None   Collection Time: 11/27/2018  5:57 PM   Specimen: Nasal Mucosa; Nasopharyngeal  Result Value Ref Range Status   MRSA by PCR NEGATIVE NEGATIVE Final     Comment:        The GeneXpert MRSA Assay (FDA approved for NASAL specimens only), is one component of a comprehensive MRSA colonization surveillance program. It is not intended to diagnose MRSA infection nor to guide or monitor treatment for MRSA infections. Performed at [facility], [place] [address], [place], [place] [address]     Radiology Reports Dg Abd 1 View  Result Date: 11/20/2018 CLINICAL DATA:  Orogastric tube placement. EXAM: ABDOMEN - 1 VIEW COMPARISON:  None FINDINGS: Orogastric tube tip is in the distal body of the stomach. No dilated bowel.  Previous lumbar fusion at L5-S1. IMPRESSION: OG tube tip is in the distal stomach. Electronically Signed   By: Lorriane Shire M.D.   On: 11/22/2018 20:47   Dg Chest Port 1 View  Result Date: 12/15/2018 CLINICAL DATA:  Pulmonary infiltrates. Endotracheally intubated. COVID-19. EXAM: PORTABLE CHEST 1 VIEW 10:25 a.m. COMPARISON:  12/15/2018 at 5:09 a.m. and 12/03/2018 FINDINGS: Endotracheal tube is in good position 4 cm above the carina. NG tube tip is below the diaphragm. Central line tip is in the superior vena cava just below the carina, unchanged. Extensive bilateral pulmonary infiltrates persist, slightly increased at the left base. No effusions.  No acute bone abnormality. IMPRESSION: Slight progression of pulmonary infiltrates at the left base. No other change. Electronically Signed   By: Lorriane Shire M.D.   On: 12/15/2018 11:15   Portable Chest 1 View  Result Date: 12/15/2018 CLINICAL DATA:  Shortness of breath.  COVID-19. EXAM: PORTABLE CHEST 1 VIEW COMPARISON:  12/04/2018. FINDINGS: Patient is rotated to the right. Endotracheal tube tip noted just above the right mainstem bronchus. Retraction of approximately 2 cm suggested. NG tube noted with tip below left hemidiaphragm. Right IJ line stable position. Heart size stable. Diffuse severe bilat interstitial infiltrates are again noted. Persistent low lung volumes.  No pleural effusion or pneumothorax. IMPRESSION: 1. Endotracheal tube tip is noted just above the right mainstem bronchus. Retraction of approximately 2 cm suggested. NG tube and right IJ line stable position. 2. Diffuse severe bilateral pulmonary interstitial infiltrates again noted. Persistent low lung volumes. Critical Value/emergent results were called by telephone at the time of interpretation on 12/15/2018 at 7:01 am to nurse Elmyra Ricks, who verbally acknowledged these results. Electronically Signed   By: Marcello Moores  Register   On: 12/15/2018 07:02   Dg Chest Port 1 View  Result Date: 11/25/2018 CLINICAL DATA:  Acute respiratory failure with hypoxia. EXAM: PORTABLE CHEST 1 VIEW COMPARISON:  December 14, 2018 FINDINGS: The endotracheal tube terminates above the carina by approximately 2.1 cm. The right-sided central venous catheter is well position. The enteric tube extends below the left hemidiaphragm. The heart size is enlarged. Aortic calcifications are noted. Again seen are diffuse bilateral hazy airspace opacities with some significant improvement from prior study. This may be in part due to approved imaging technique. There is no definite pneumothorax. There are likely small bilateral pleural effusions. IMPRESSION: 1. Lines and tubes as above. 2. Persistent but slightly improved multifocal airspace opacities which can be seen in patients with pulmonary edema or an atypical infectious process. Electronically Signed   By: Constance Holster M.D.   On: 12/13/2018 19:07   [place] Ekg Site Rite  Result Date: 12/07/2018 If Site Rite image not attached, placement could not be confirmed due to current cardiac rhythm.

## 2018-12-19 LAB — CBC WITH DIFFERENTIAL/PLATELET
Abs Immature Granulocytes: 0.2 10*3/uL — ABNORMAL HIGH (ref 0.00–0.07)
Basophils Absolute: 0 10*3/uL (ref 0.0–0.1)
Basophils Relative: 0 %
Eosinophils Absolute: 0 10*3/uL (ref 0.0–0.5)
Eosinophils Relative: 0 %
HCT: 38.3 % (ref 36.0–46.0)
Hemoglobin: 11.6 g/dL — ABNORMAL LOW (ref 12.0–15.0)
Immature Granulocytes: 3 %
Lymphocytes Relative: 5 %
Lymphs Abs: 0.4 10*3/uL — ABNORMAL LOW (ref 0.7–4.0)
MCH: 29.8 pg (ref 26.0–34.0)
MCHC: 30.3 g/dL (ref 30.0–36.0)
MCV: 98.5 fL (ref 80.0–100.0)
Monocytes Absolute: 0.5 10*3/uL (ref 0.1–1.0)
Monocytes Relative: 6 %
Neutro Abs: 6.7 10*3/uL (ref 1.7–7.7)
Neutrophils Relative %: 86 %
Platelets: 230 10*3/uL (ref 150–400)
RBC: 3.89 MIL/uL (ref 3.87–5.11)
RDW: 14.4 % (ref 11.5–15.5)
WBC: 7.8 10*3/uL (ref 4.0–10.5)
nRBC: 0 % (ref 0.0–0.2)

## 2018-12-19 LAB — COMPREHENSIVE METABOLIC PANEL
ALT: 35 U/L (ref 0–44)
AST: 29 U/L (ref 15–41)
Albumin: 2.3 g/dL — ABNORMAL LOW (ref 3.5–5.0)
Alkaline Phosphatase: 92 U/L (ref 38–126)
Anion gap: 11 (ref 5–15)
BUN: 84 mg/dL — ABNORMAL HIGH (ref 8–23)
CO2: 24 mmol/L (ref 22–32)
Calcium: 7.6 mg/dL — ABNORMAL LOW (ref 8.9–10.3)
Chloride: 111 mmol/L (ref 98–111)
Creatinine, Ser: 1.44 mg/dL — ABNORMAL HIGH (ref 0.44–1.00)
GFR calc Af Amer: 41 mL/min — ABNORMAL LOW (ref >60)
GFR calc non Af Amer: 35 mL/min — ABNORMAL LOW (ref >60)
Glucose, Bld: 210 mg/dL — ABNORMAL HIGH (ref 70–99)
Potassium: 4.1 mmol/L (ref 3.5–5.1)
Sodium: 146 mmol/L — ABNORMAL HIGH (ref 135–145)
Total Bilirubin: 0.2 mg/dL — ABNORMAL LOW (ref 0.3–1.2)
Total Protein: 4.7 g/dL — ABNORMAL LOW (ref 6.5–8.1)

## 2018-12-19 LAB — GLUCOSE, CAPILLARY
Glucose-Capillary: 194 mg/dL — ABNORMAL HIGH (ref 70–99)
Glucose-Capillary: 210 mg/dL — ABNORMAL HIGH (ref 70–99)
Glucose-Capillary: 183 mg/dL — ABNORMAL HIGH (ref 70–99)
Glucose-Capillary: 219 mg/dL — ABNORMAL HIGH (ref 70–99)
Glucose-Capillary: 202 mg/dL — ABNORMAL HIGH (ref 70–99)

## 2018-12-19 LAB — D-DIMER, QUANTITATIVE: D-Dimer, Quant: 6.58 ug/mL-FEU — ABNORMAL HIGH (ref 0.00–0.50)

## 2018-12-19 LAB — FERRITIN: Ferritin: 164 ng/mL (ref 11–307)

## 2018-12-19 LAB — MAGNESIUM: Magnesium: 2.2 mg/dL (ref 1.7–2.4)

## 2018-12-19 LAB — TRIGLYCERIDES: Triglycerides: 404 mg/dL — ABNORMAL HIGH (ref <150)

## 2018-12-19 LAB — C-REACTIVE PROTEIN: CRP: 1.9 mg/dL — ABNORMAL HIGH (ref <1.0)

## 2018-12-19 NOTE — Progress Notes (Signed)
NAME:  Jody Taylor, MRN:  [id number], DOB:  January 12, 1941, LOS: 5 ADMISSION DATE:  12/13/2018, CONSULTATION DATE:  12/15/2018 REFERRING MD:  Sloan Leiter, CHIEF COMPLAINT:  Dyspnea   Brief History   Female with a history of diastolic heart failure admitted on June 29 for ARDS in the setting of COVID-19 pneumonia.  Required intubation at [facility] emergency room.  Past Medical History  Diastolic heart failure History of stroke (mini stroke) Obstructive sleep apnea on CPAP Hypertension Hyperlipidemia Coronary artery disease, had PCI in 7846 Chronic diastolic heart failure Chronic kidney disease GERD Allergic rhinitis  Significant Hospital Events   June 29 admission July 2, severe vent dyssynchrony, multiple ventilator changes, pressure support, SIMV July 3, oxygenation worsening overnight, tidal volume 12cc/kg IBW on SIMV; added paralytic, changed back to [facility] for better control of tidal volume July 4: oxygenation relatively improved  Consults:  Pulmonary and critical care medicine  Procedures:  June 29 endotracheal tube> June 29 right internal jugular central venous line>   Significant Diagnostic Tests:    Micro Data:  June 26 SARS-CoV-2 positive  Antimicrobials:  June 29 remdesivir June 29 Actemra June 29 solumedrol June 29 convalescent plasma   Interim history/subjective:   Cr Up Oxygenation improved some Received very little paralytic overnight   Objective   Blood pressure 140/65, pulse 91, temperature 99.3 F (37.4 C), resp. rate (!) 28, height 4\' 10"  (1.473 m), weight 94.1 kg, SpO2 98 %.    Vent Mode: PRVC FiO2 (%):  [50 %-80 %] 50 % Set Rate:  [12 bmp-28 bmp] 28 bmp Vt Set:  [300 mL] 300 mL PEEP:  [12 cmH20] 12 cmH20 Pressure Support:  [10 cmH20] 10 cmH20 Plateau Pressure:  [18 cmH20-25 cmH20] 21 cmH20   Intake/Output Summary (Last 24 hours) at 12/19/2018 0746 Last data filed at 12/19/2018 0600 Gross per 24 hour  Intake 2933.97 ml  Output 1650 ml   Net 1283.97 ml   Filed Weights   12/17/18 0500 12/18/18 0500 12/19/18 0500  Weight: 94.2 kg 91.9 kg 94.1 kg    Examination:  General:  In bed on vent HENT: NCAT ETT in place PULM: CTA B, vent supported breathing CV: RRR, no mgr GI: BS+, soft, nontender MSK: normal bulk and tone Neuro: sedated on vent   7/3 CXR images personally reviewed: Bilateral interstitial opacities left greater than right, cardiomegaly, support apparatus in place  Resolved Hospital Problem list     Assessment & Plan:  ARDS due to COVID-19 pneumonia: Oxygenation worsening in last 24 hours, presumably related to worsening ARDS Continue full mechanical ventilatory support with ARDS ventilator protocol Target tidal volume 7 cc/kg ideal body weight today Continue intermittent paralytic protocol, RA SS goal -4 to -5 Target PaO2 is 55-65 Adjust PEEP/FiO2 per ARDS stable Keep driving pressure less than 15 cm of water, plateau pressure less than 30 cm of water ABG PRN Chest x-ray as needed Ventilator associated pneumonia prevention protocol  Baseline asthma? on Fasenra as outpatient, unclear pulmonary function testing, notes from [place] pulmonary just inidcate allergic rhinitis and cough Albuterol as needed  Diastolic heart failure: Diurese as able Telemetry monitoring Hemodynamic monitoring  Best practice:  Diet: tube feeding Pain/Anxiety/Delirium protocol (if indicated): as above VAP protocol (if indicated): yes DVT prophylaxis: lovenox GI prophylaxis: Pantoprazole for stress ulcer prophylaxis Glucose control: SSI Mobility: passive range of motion Code Status: full Family Communication: per [facility] Disposition: remain in ICU  Labs   CBC: Recent Labs  Lab 12/15/18 0505  12/16/18 0500  12/17/18 0455 12/18/18 0500 12/18/18 0829 12/19/18 0500  WBC 3.4*  --  7.2 6.4 6.3  --  7.8  NEUTROABS 2.9  --  6.3 5.3 5.2  --  6.7  HGB 11.4*   < > 11.0* 11.2* 12.8 12.2 11.6*  HCT 35.5*   < > 34.8* 34.3*  39.5 36.0 38.3  MCV 94.9  --  95.6 95.3 94.3  --  98.5  PLT 180  --  205 220 238  --  230   < > = values in this interval not displayed.    Basic Metabolic Panel: Recent Labs  Lab 11/19/2018 1850  12/15/18 0505  12/15/18 1850 12/16/18 0500 12/17/18 0455 12/18/18 0500 12/18/18 0829 12/19/18 0500  NA 135   < > 138   < >  --  141 142 143 143 146*  K 4.3   < > 3.7   < >  --  4.2 3.3* 3.6 3.6 4.1  CL 104  --  106  --   --  110 106 105  --  111  CO2 19*  --  22  --   --  21* 27 28  --  24  GLUCOSE 191*  --  230*  --   --  212* 194* 234*  --  210*  BUN 30*  --  36*  --   --  53* 59* 67*  --  84*  CREATININE 1.87*  --  1.81*  --   --  1.61* 1.44* 1.24*  --  1.44*  CALCIUM 8.1*  --  7.9*  --   --  7.9* 7.6* 7.7*  --  7.6*  MG 1.9  --  2.0  --  2.0 2.1  --  1.9  --  2.2  PHOS 3.3  --  3.0  --  2.9 2.8  --   --   --   --    < > = values in this interval not displayed.   GFR: Estimated Creatinine Clearance: 32.6 mL/min (A) (by C-G formula based on SCr of 1.44 mg/dL (H)). Recent Labs  Lab 12/10/2018 1850  12/16/18 0500 12/17/18 0455 12/18/18 0500 12/19/18 0500  PROCALCITON <0.10  --   --   --   --   --   WBC 4.4   < > 7.2 6.4 6.3 7.8   < > = values in this interval not displayed.    Liver Function Tests: Recent Labs  Lab 12/15/18 0505 12/16/18 0500 12/17/18 0455 12/18/18 0500 12/19/18 0500  AST 40 33 32 32 29  ALT 34 30 30 36 35  ALKPHOS 69 68 72 102 92  BILITOT 0.2* 0.3 0.2* 0.4 0.2*  PROT 5.5* 5.3* 5.0* 5.3* 4.7*  ALBUMIN 2.4* 2.4* 2.4* 2.5* 2.3*   No results for input(s): LIPASE, AMYLASE in the last 168 hours. No results for input(s): AMMONIA in the last 168 hours.  ABG    Component Value Date/Time   PHART 7.424 12/18/2018 0829   PCO2ART 41.7 12/18/2018 0829   PO2ART 58.0 (L) 12/18/2018 0829   HCO3 27.4 12/18/2018 0829   TCO2 29 12/18/2018 0829   ACIDBASEDEF 5.0 (H) 12/15/2018 1501   O2SAT 91.0 12/18/2018 0829     Coagulation Profile: No results for  input(s): INR, PROTIME in the last 168 hours.  Cardiac Enzymes: No results for input(s): CKTOTAL, CKMB, CKMBINDEX, TROPONINI in the last 168 hours.  HbA1C: Hgb A1c MFr Bld  Date/Time Value Ref Range Status  11/08/2016 07:24 AM  5.6 4.8 - 5.6 % Final    Comment:    (NOTE)         Pre-diabetes: 5.7 - 6.4         Diabetes: >6.4         Glycemic control for adults with diabetes: <7.0     CBG: Recent Labs  Lab 12/18/18 1327 12/18/18 1555 12/18/18 1933 12/18/18 2334 12/19/18 0442  GLUCAP 219* 239* 226* 169* 194*       Critical care time: 32 minutes     Roselie Awkward, MD [place] [facility] Pager: [phone] Cell: [phone] If no response, call [phone]

## 2018-12-19 NOTE — Progress Notes (Signed)
Daughter called and updated on patient status, all questions answered. Daughter stated gratefulness for [place] helping her mother.

## 2018-12-19 NOTE — Progress Notes (Signed)
PROGRESS NOTE                                                                                                                                                                                                             Patient Demographics:    Jody Taylor, is a 78 y.o. female, DOB - Apr 26, 1941, YTK:[id number]  Outpatient Primary MD for the patient is Nicoletta Dress, MD    LOS - 5  No chief complaint on file.      Brief Narrative: Patient is a 78 y.o. female with PMHx of HTN, CAD, chronic diastolic heart failure, severe persistent asthma on benralizumab, hypothyroidism, CKD stage III-presented to [facility] with acute respiratory distress-was found to have acute hypoxic respiratory failure secondary to COVID-19 pneumonia and emergently intubated and subsequently transferred to [facility].   Subjective:    Jody Taylor today remains intubated and sedated.  Per nursing staff-no major issues overnight.   Assessment  & Plan :   Acute Hypoxic Resp Failure due to Covid 19 Viral pneumonia: Remains stable overnight-continue full ventilator support per PCCM.  Hospital course complicated by ventilator dyssynchrony requiring as needed doses of paralytics.  Has received maximal medical treatment including steroids, Remdesivir, Actemra and plasma.  Continue to follow inflammatory markers.  COVID-19 Labs:  Recent Labs    12/17/18 0415 12/17/18 0455 12/18/18 0500 12/19/18 0500  DDIMER  --  2.66* 3.44* 6.58*  FERRITIN 239  --  197 164  CRP 4.1*  --  3.5* 1.9*    No results found for: SARSCOV2NAA   COVID-19 Medications: 6/29>> Actemra 6/29>> Remdesivir 6/29>> convalescent plasma 6/29>> Solu-Medrol  Vent Settings: Vent Mode: PRVC FiO2 (%):  [50 %-70 %] 50 % Set Rate:  [28 bmp] 28 bmp Vt Set:  [300 mL] 300 mL PEEP:  [12 cmH20] 12 cmH20 Plateau Pressure:  [18 cmH20-25 cmH20] 23 cmH20  ABG:     Component Value Date/Time   PHART 7.424 12/18/2018 0829   PCO2ART 41.7 12/18/2018 0829   PO2ART 58.0 (L) 12/18/2018 0829   HCO3 27.4 12/18/2018 0829   TCO2 29 12/18/2018 0829   ACIDBASEDEF 5.0 (H) 12/15/2018 1501   O2SAT 91.0 12/18/2018 0829    ? DM-2 vs steroid-induced hyperglycemia: CBGs stable-continue 20 months of Levemir and SSI-follow and adjust accordingly.  Check A1c  HTN: BP stable-continue  with as needed hydralazine  CKD stage III: Creatinine close to usual baseline-follow  CAD: No anginal symptoms evident-continue aspirin  Chronic diastolic heart failure: This appears stable-continue Lasix-follow renal function, I/O's and daily weights  History of severe persistent asthma: No wheezing-continue bronchodilators-on benralizumab as outpatient.  Dyslipidemia: Continue statin  Hypothyroidism: Continue Synthroid  Dyslipidemia: Continue statin  GERD: Continue PPI  Anxiety/depression: Suspect some issues with anxiety contributing to ventilator dyssynchrony-continue Seroquel  Condition - Extremely Guarded  Family Communication  :  Son updated over the phone  Code Status : DNR  Diet :  Diet Order    None       Disposition Plan  :  Remain inpatient  Consults  :  PCCM  Procedures  :    ETT>> 6/29 Right IJ placed at [facility]>> 6/29  GI prophylaxis: PPI  DVT Prophylaxis  :  Lovenox twice daily dosing-watch d-dimer  Lab Results  Component Value Date   PLT 230 12/19/2018    Inpatient Medications  Scheduled Meds: . sodium chloride   Intravenous Once  . artificial tears  1 application Both Eyes I3J  . aspirin  81 mg Per Tube Daily  . chlorhexidine gluconate (MEDLINE KIT)  15 mL Mouth Rinse BID  . Chlorhexidine Gluconate Cloth  6 each Topical Daily  . clonazePAM  1 mg Oral BID  . dexamethasone (DECADRON) injection  6 mg Intravenous Q12H  . enoxaparin (LOVENOX) injection  40 mg Subcutaneous Q12H  . fentaNYL  1 patch Transdermal Q72H  . free  water  200 mL Per Tube Q8H  . furosemide  60 mg Intravenous BID  . insulin aspart  0-15 Units Subcutaneous Q4H  . insulin detemir  20 Units Subcutaneous Daily  . levothyroxine  50 mcg Intravenous Daily  . mouth rinse  15 mL Mouth Rinse 10 times per day  . pantoprazole sodium  40 mg Per Tube Daily  . QUEtiapine  50 mg Oral BID  . sodium chloride flush  10-40 mL Intracatheter Q12H  . vitamin C  500 mg Per Tube Daily  . zinc sulfate  220 mg Per Tube Daily   Continuous Infusions: . sodium chloride 50 mL/hr at 12/19/18 1200  . feeding supplement (VITAL HIGH PROTEIN) 50 mL/hr at 12/18/18 2237  . fentaNYL infusion INTRAVENOUS 200 mcg/hr (12/19/18 1200)  . midazolam 3 mg/hr (12/19/18 1200)  . norepinephrine (LEVOPHED) Adult infusion Stopped (12/18/18 1559)   PRN Meds:.acetaminophen, fentaNYL, hydrALAZINE, ipratropium-albuterol, nitroGLYCERIN, [DISCONTINUED] ondansetron **OR** ondansetron (ZOFRAN) IV, polyethylene glycol, vecuronium  Antibiotics  :    Anti-infectives (From admission, onward)   Start     Dose/Rate Route Frequency Ordered Stop   12/15/18 2000  remdesivir 100 mg in sodium chloride 0.9 % 250 mL IVPB     100 mg 500 mL/hr over 30 Minutes Intravenous Every 24 hours 11/26/2018 1845 12/18/18 2053   12/15/2018 2000  remdesivir 200 mg in sodium chloride 0.9 % 250 mL IVPB     200 mg 500 mL/hr over 30 Minutes Intravenous Once 12/05/2018 1845 12/05/2018 2116       Time Spent in minutes  45   Oren Binet M.D on 12/19/2018 at 2:00 PM  To page go to [email or web address] - use universal password  [facility] -  Office  [phone]  The patient is critically ill with multiple organ system failure and requires high complexity decision making for assessment and support, frequent evaluation and titration of therapies, advanced monitoring, review of radiographic studies and interpretation of complex  data.   See all Orders from today for further details   Admit date - 11/29/2018    5     Objective:   Vitals:   12/19/18 1000 12/19/18 1100 12/19/18 1137 12/19/18 1200  BP: 125/67 139/63 140/63   Pulse: 83 76 74 73  Resp: (!) 28 (!) 28 (!) 28 (!) 25  Temp: 99 F (37.2 C) 99.3 F (37.4 C) 99.5 F (37.5 C) 99.5 F (37.5 C)  TempSrc:    Bladder  SpO2: 98% 98% 98% 98%  Weight:      Height:        Wt Readings from Last 3 Encounters:  12/19/18 94.1 kg  03/02/18 95.5 kg  01/13/18 95.7 kg     Intake/Output Summary (Last 24 hours) at 12/19/2018 1400 Last data filed at 12/19/2018 1200 Gross per 24 hour  Intake 3151.6 ml  Output 2000 ml  Net 1151.6 ml     Physical Exam Gen Exam:Intubated, Sedated-not in any distress HEENT:atraumatic, normocephalic Chest: B/L clear to auscultation anteriorly CVS:S1S2 regular Abdomen:soft non tender, non distended Extremities:no edema Neurology: difficult to examine given sedation/intubation Skin: no rash   Data Review:    CBC Recent Labs  Lab 12/15/18 0505  12/16/18 0500 12/17/18 0455 12/18/18 0500 12/18/18 0829 12/19/18 0500  WBC 3.4*  --  7.2 6.4 6.3  --  7.8  HGB 11.4*   < > 11.0* 11.2* 12.8 12.2 11.6*  HCT 35.5*   < > 34.8* 34.3* 39.5 36.0 38.3  PLT 180  --  205 220 238  --  230  MCV 94.9  --  95.6 95.3 94.3  --  98.5  MCH 30.5  --  30.2 31.1 30.5  --  29.8  MCHC 32.1  --  31.6 32.7 32.4  --  30.3  RDW 14.7  --  14.6 14.4 14.2  --  14.4  LYMPHSABS 0.4*  --  0.4* 0.5* 0.5*  --  0.4*  MONOABS 0.1  --  0.5 0.5 0.5  --  0.5  EOSABS 0.0  --  0.0 0.0 0.0  --  0.0  BASOSABS 0.0  --  0.0 0.0 0.0  --  0.0   < > = values in this interval not displayed.    Chemistries  Recent Labs  Lab 12/15/18 0505  12/15/18 1850 12/16/18 0500 12/17/18 0455 12/18/18 0500 12/18/18 0829 12/19/18 0500  NA 138   < >  --  141 142 143 143 146*  K 3.7   < >  --  4.2 3.3* 3.6 3.6 4.1  CL 106  --   --  110 106 105  --  111  CO2 22  --   --  21* 27 28  --  24  GLUCOSE 230*  --   --  212* 194* 234*  --  210*  BUN 36*  --   --  53* 59*  67*  --  84*  CREATININE 1.81*  --   --  1.61* 1.44* 1.24*  --  1.44*  CALCIUM 7.9*  --   --  7.9* 7.6* 7.7*  --  7.6*  MG 2.0  --  2.0 2.1  --  1.9  --  2.2  AST 40  --   --  33 32 32  --  29  ALT 34  --   --  30 30 36  --  35  ALKPHOS 69  --   --  68 72 102  --  92  BILITOT 0.2*  --   --  0.3 0.2* 0.4  --  0.2*   < > = values in this interval not displayed.   ------------------------------------------------------------------------------------------------------------------ Recent Labs    12/18/18 0500 12/19/18 0500  TRIG 429* 404*    Lab Results  Component Value Date   HGBA1C 5.6 11/08/2016   ------------------------------------------------------------------------------------------------------------------ No results for input(s): TSH, T4TOTAL, T3FREE, THYROIDAB in the last 72 hours.  Invalid input(s): FREET3 ------------------------------------------------------------------------------------------------------------------ Recent Labs    12/18/18 0500 12/19/18 0500  FERRITIN 197 164    Coagulation profile No results for input(s): INR, PROTIME in the last 168 hours.  Recent Labs    12/18/18 0500 12/19/18 0500  DDIMER 3.44* 6.58*    Cardiac Enzymes No results for input(s): CKMB, TROPONINI, MYOGLOBIN in the last 168 hours.  Invalid input(s): CK ------------------------------------------------------------------------------------------------------------------    Component Value Date/Time   BNP 240.7 (H) 12/16/2018 1300    Micro Results Recent Results (from the past 240 hour(s))  MRSA PCR Screening     Status: None   Collection Time: 12/06/2018  5:57 PM   Specimen: Nasal Mucosa; Nasopharyngeal  Result Value Ref Range Status   MRSA by PCR NEGATIVE NEGATIVE Final    Comment:        The GeneXpert MRSA Assay (FDA approved for NASAL specimens only), is one component of a comprehensive MRSA colonization surveillance program. It is not intended to diagnose MRSA  infection nor to guide or monitor treatment for MRSA infections. Performed at [facility], [place] [address] [place], [place] [address]     Radiology Reports Dg Abd 1 View  Result Date: 11/21/2018 CLINICAL DATA:  Orogastric tube placement. EXAM: ABDOMEN - 1 VIEW COMPARISON:  None FINDINGS: Orogastric tube tip is in the distal body of the stomach. No dilated bowel.  Previous lumbar fusion at L5-S1. IMPRESSION: OG tube tip is in the distal stomach. Electronically Signed   By: Lorriane Shire M.D.   On: 12/13/2018 20:47   Dg Chest Port 1 View  Result Date: 12/18/2018 CLINICAL DATA:  Shock. EXAM: PORTABLE CHEST 1 VIEW COMPARISON:  Radiograph of same day. FINDINGS: Stable cardiomediastinal silhouette. Endotracheal and feeding tubes are in good position. Right-sided PICC line is unchanged in position. No pneumothorax or pleural effusion is noted. Stable diffuse bilateral lung opacities are noted consistent with pneumonia. Bony thorax unremarkable. IMPRESSION: Stable support apparatus. Stable bilateral lung opacities consistent with pneumonia. Aortic Atherosclerosis (ICD10-I70.0). Electronically Signed   By: Marijo Conception M.D.   On: 12/18/2018 17:20   Dg Chest Port 1 View  Result Date: 12/18/2018 CLINICAL DATA:  Shortness of breath, pneumonia, COVID-19 virus EXAM: PORTABLE CHEST 1 VIEW COMPARISON:  12/15/2018 FINDINGS: Endotracheal tube 3.2 cm above the carina. Exam is rotated to the left. Right upper extremity PICC line tip mid SVC level. Stable cardiomegaly and diffuse mixed interstitial and airspace opacities throughout both lungs. Low lung volumes persist. No significant change in aeration. No developing large effusion or pneumothorax. Aorta atherosclerotic. IMPRESSION: Stable cardiomegaly and diffuse mixed interstitial and airspace process compatible with pneumonia/viral pneumonia. Electronically Signed   By: Jerilynn Mages.  Shick M.D.   On: 12/18/2018 11:12   Dg Chest Port 1 View  Result  Date: 12/15/2018 CLINICAL DATA:  Pulmonary infiltrates. Endotracheally intubated. COVID-19. EXAM: PORTABLE CHEST 1 VIEW 10:25 a.m. COMPARISON:  12/15/2018 at 5:09 a.m. and 11/22/2018 FINDINGS: Endotracheal tube is in good position 4 cm above the carina. NG tube tip is below the diaphragm. Central line tip  is in the superior vena cava just below the carina, unchanged. Extensive bilateral pulmonary infiltrates persist, slightly increased at the left base. No effusions.  No acute bone abnormality. IMPRESSION: Slight progression of pulmonary infiltrates at the left base. No other change. Electronically Signed   By: Lorriane Shire M.D.   On: 12/15/2018 11:15   Portable Chest 1 View  Result Date: 12/15/2018 CLINICAL DATA:  Shortness of breath.  COVID-19. EXAM: PORTABLE CHEST 1 VIEW COMPARISON:  12/04/2018. FINDINGS: Patient is rotated to the right. Endotracheal tube tip noted just above the right mainstem bronchus. Retraction of approximately 2 cm suggested. NG tube noted with tip below left hemidiaphragm. Right IJ line stable position. Heart size stable. Diffuse severe bilat interstitial infiltrates are again noted. Persistent low lung volumes. No pleural effusion or pneumothorax. IMPRESSION: 1. Endotracheal tube tip is noted just above the right mainstem bronchus. Retraction of approximately 2 cm suggested. NG tube and right IJ line stable position. 2. Diffuse severe bilateral pulmonary interstitial infiltrates again noted. Persistent low lung volumes. Critical Value/emergent results were called by telephone at the time of interpretation on 12/15/2018 at 7:01 am to nurse Elmyra Ricks, who verbally acknowledged these results. Electronically Signed   By: Marcello Moores  Register   On: 12/15/2018 07:02   Dg Chest Port 1 View  Result Date: 11/25/2018 CLINICAL DATA:  Acute respiratory failure with hypoxia. EXAM: PORTABLE CHEST 1 VIEW COMPARISON:  December 14, 2018 FINDINGS: The endotracheal tube terminates above the carina by  approximately 2.1 cm. The right-sided central venous catheter is well position. The enteric tube extends below the left hemidiaphragm. The heart size is enlarged. Aortic calcifications are noted. Again seen are diffuse bilateral hazy airspace opacities with some significant improvement from prior study. This may be in part due to approved imaging technique. There is no definite pneumothorax. There are likely small bilateral pleural effusions. IMPRESSION: 1. Lines and tubes as above. 2. Persistent but slightly improved multifocal airspace opacities which can be seen in patients with pulmonary edema or an atypical infectious process. Electronically Signed   By: Constance Holster M.D.   On: 11/28/2018 19:07   [place] Ekg Site Rite  Result Date: 12/08/2018 If Site Rite image not attached, placement could not be confirmed due to current cardiac rhythm.

## 2018-12-19 NOTE — Progress Notes (Signed)
Family was called and updated. Facetimed with pt. RN answered questions to best of ability. Family expressed appreciation for update and care.

## 2018-12-20 ENCOUNTER — Inpatient Hospital Stay (HOSPITAL_COMMUNITY): Payer: Medicare HMO

## 2018-12-20 DIAGNOSIS — J80 Acute respiratory distress syndrome: Secondary | ICD-10-CM

## 2018-12-20 DIAGNOSIS — U071 COVID-19: Principal | ICD-10-CM

## 2018-12-20 DIAGNOSIS — J988 Other specified respiratory disorders: Secondary | ICD-10-CM

## 2018-12-20 LAB — COMPREHENSIVE METABOLIC PANEL
ALT: 30 U/L (ref 0–44)
AST: 22 U/L (ref 15–41)
Albumin: 2.2 g/dL — ABNORMAL LOW (ref 3.5–5.0)
Alkaline Phosphatase: 76 U/L (ref 38–126)
Anion gap: 10 (ref 5–15)
BUN: 99 mg/dL — ABNORMAL HIGH (ref 8–23)
CO2: 29 mmol/L (ref 22–32)
Calcium: 8 mg/dL — ABNORMAL LOW (ref 8.9–10.3)
Chloride: 112 mmol/L — ABNORMAL HIGH (ref 98–111)
Creatinine, Ser: 1.52 mg/dL — ABNORMAL HIGH (ref 0.44–1.00)
GFR calc Af Amer: 38 mL/min — ABNORMAL LOW (ref >60)
GFR calc non Af Amer: 33 mL/min — ABNORMAL LOW (ref >60)
Glucose, Bld: 144 mg/dL — ABNORMAL HIGH (ref 70–99)
Potassium: 3.6 mmol/L (ref 3.5–5.1)
Sodium: 151 mmol/L — ABNORMAL HIGH (ref 135–145)
Total Bilirubin: 0.1 mg/dL — ABNORMAL LOW (ref 0.3–1.2)
Total Protein: 4.5 g/dL — ABNORMAL LOW (ref 6.5–8.1)

## 2018-12-20 LAB — CBC
HCT: 37.9 % (ref 36.0–46.0)
Hemoglobin: 11.8 g/dL — ABNORMAL LOW (ref 12.0–15.0)
MCH: 31.1 pg (ref 26.0–34.0)
MCHC: 31.1 g/dL (ref 30.0–36.0)
MCV: 100 fL (ref 80.0–100.0)
Platelets: 226 10*3/uL (ref 150–400)
RBC: 3.79 MIL/uL — ABNORMAL LOW (ref 3.87–5.11)
RDW: 14.6 % (ref 11.5–15.5)
WBC: 6.7 10*3/uL (ref 4.0–10.5)
nRBC: 0 % (ref 0.0–0.2)

## 2018-12-20 LAB — MAGNESIUM: Magnesium: 2.3 mg/dL (ref 1.7–2.4)

## 2018-12-20 LAB — GLUCOSE, CAPILLARY
Glucose-Capillary: 146 mg/dL — ABNORMAL HIGH (ref 70–99)
Glucose-Capillary: 153 mg/dL — ABNORMAL HIGH (ref 70–99)
Glucose-Capillary: 197 mg/dL — ABNORMAL HIGH (ref 70–99)
Glucose-Capillary: 287 mg/dL — ABNORMAL HIGH (ref 70–99)
Glucose-Capillary: 215 mg/dL — ABNORMAL HIGH (ref 70–99)
Glucose-Capillary: 255 mg/dL — ABNORMAL HIGH (ref 70–99)

## 2018-12-20 LAB — C-REACTIVE PROTEIN: CRP: 1.4 mg/dL — ABNORMAL HIGH (ref <1.0)

## 2018-12-20 LAB — PROCALCITONIN: Procalcitonin: <0.1 ng/mL

## 2018-12-20 LAB — LACTIC ACID, PLASMA: Lactic Acid, Venous: 1.3 mmol/L (ref 0.5–1.9)

## 2018-12-20 LAB — FERRITIN: Ferritin: 134 ng/mL (ref 11–307)

## 2018-12-20 LAB — HEMOGLOBIN A1C
Hgb A1c MFr Bld: 6.4 % — ABNORMAL HIGH (ref 4.8–5.6)
Mean Plasma Glucose: 136.98 mg/dL

## 2018-12-20 LAB — D-DIMER, QUANTITATIVE: D-Dimer, Quant: 5.2 ug/mL-FEU — ABNORMAL HIGH (ref 0.00–0.50)

## 2018-12-20 MED ORDER — LINEZOLID 600 MG/300ML IV SOLN
600.0000 mg | Freq: Two times a day (BID) | INTRAVENOUS | Status: DC
  Administered 2018-12-20 – 2018-12-21 (×3): 600 mg via INTRAVENOUS
  Filled 2018-12-20 (×4): qty 300

## 2018-12-20 MED ORDER — FREE WATER
250.0000 mL | Freq: Four times a day (QID) | Status: DC
  Administered 2018-12-20: 12:00:00 250 mL

## 2018-12-20 MED ORDER — SODIUM CHLORIDE 0.9 % IV SOLN
2.0000 g | Freq: Two times a day (BID) | INTRAVENOUS | Status: DC
  Administered 2018-12-20 – 2018-12-22 (×5): 2 g via INTRAVENOUS
  Filled 2018-12-20 (×5): qty 2

## 2018-12-20 MED ORDER — FREE WATER
300.0000 mL | Status: DC
  Administered 2018-12-20 – 2018-12-21 (×5): 300 mL

## 2018-12-20 MED ORDER — SODIUM CHLORIDE 0.45 % IV SOLN
INTRAVENOUS | Status: DC
  Administered 2018-12-20 – 2018-12-31 (×7): via INTRAVENOUS

## 2018-12-20 NOTE — Progress Notes (Signed)
NAME:  NESIAH JUMP, MRN:  [id number], DOB:  10-21-1940, LOS: 6 ADMISSION DATE:  11/20/2018, CONSULTATION DATE:  12/01/2018 REFERRING MD:  Sloan Leiter, CHIEF COMPLAINT:  Dyspnea   Brief History   Female with a history of diastolic heart failure admitted on June 29 for ARDS in the setting of COVID-19 pneumonia.  Required intubation at [facility] emergency room.  Past Medical History  Diastolic heart failure History of stroke (mini stroke) Obstructive sleep apnea on CPAP Hypertension Hyperlipidemia Coronary artery disease, had PCI in 4888 Chronic diastolic heart failure Chronic kidney disease GERD Allergic rhinitis  Significant Hospital Events   June 29 admission July 2, severe vent dyssynchrony, multiple ventilator changes, pressure support, SIMV July 3, oxygenation worsening overnight, tidal volume 12cc/kg IBW on SIMV July 4 -  oxygenation worsening overnight, tidal volume 12cc/kg IBW on SIMV  Consults:  Pulmonary and critical care medicine  Procedures:  June 29 endotracheal tube> June 29 right internal jugular central venous line>   Significant Diagnostic Tests:    Micro Data:  June 26 SARS-CoV-2 positive  Antimicrobials:  June 29 remdesivir June 29 Actemra June 29 solumedrol June 29 convalescent plasma   Interim history/subjective:   December 20, 2018- on Versed and fentanyl drips.  Last paralytic dose was 48 hours ago.  On 50% FiO2 with a PEEP of 12 -> PO2 58.  Making urine but mild AKI with a creatinine of 1.52 and rising sodium to 151...  Concern from previous days that she might have developed some ventilator associated lung injury.  Overall no change.  Currently in supine ventilation.  Off levophed  Noted at the time of this dictation patient got dyssynchronous with the ventilator and desaturated.  Order for as needed paralytic given.  Having rising temperature particularly since December 18, 2018 with a T-max of 101 today  Objective   Blood pressure 127/67, pulse  100, temperature (!) 100.4 F (38 C), resp. rate 18, height 4\' 10"  (1.473 m), weight 94.1 kg, SpO2 (!) 88 %.    Vent Mode: PRVC FiO2 (%):  [50 %] 50 % Set Rate:  [28 bmp] 28 bmp Vt Set:  [300 mL] 300 mL PEEP:  [12 cmH20] 12 cmH20 Plateau Pressure:  [20 cmH20-22 cmH20] 21 cmH20   Intake/Output Summary (Last 24 hours) at 12/20/2018 1155 Last data filed at 12/20/2018 1100 Gross per 24 hour  Intake 3243.35 ml  Output 2420 ml  Net 823.35 ml   Filed Weights   12/17/18 0500 12/18/18 0500 12/19/18 0500  Weight: 94.2 kg 91.9 kg 94.1 kg   General Appearance:  Looks criticall ill OBESE -no Head:  Normocephalic, without obvious abnormality, atraumatic Eyes:  PERRL -yes, conjunctiva/corneas -Muddy   Ears:  Normal external ear canals, both ears Nose:  G tube - no Throat:  ETT TUBE - yes , OG tube - yes Neck:  Supple,  No enlargement/tenderness/nodules Lungs: Clear to auscultation bilaterally, Ventilator   Synchrony - yes Heart:  S1 and S2 normal, no murmur, CVP - no.  Pressors - no Abdomen:  Soft, no masses, no organomegaly Genitalia / Rectal:  Not done Extremities:  Extremities- intact Skin:  ntact in exposed areas . Sacral area - not examined Neurologic:  Sedation - sedation gtt -> RASS - -3*      LABS    PULMONARY Recent Labs  Lab 12/02/2018 1945 12/15/18 0100 12/15/18 0447 12/15/18 1501 12/18/18 0829  PHART 7.312* 7.293* 7.389 7.311* 7.424  PCO2ART 42.3 41.2 34.9 41.5 41.7  PO2ART 293.0*  118.0* 101.0 76.0* 58.0*  HCO3 21.5 20.0 21.1 20.9 27.4  TCO2 23 21* 22 22 29   O2SAT 100.0 98.0 98.0 94.0 91.0    CBC Recent Labs  Lab 12/18/18 0500 12/18/18 0829 12/19/18 0500 12/20/18 0405  HGB 12.8 12.2 11.6* 11.8*  HCT 39.5 36.0 38.3 37.9  WBC 6.3  --  7.8 6.7  PLT 238  --  230 226    COAGULATION No results for input(s): INR in the last 168 hours.  CARDIAC  No results for input(s): TROPONINI in the last 168 hours. No results for input(s): PROBNP in the last 168  hours.   CHEMISTRY Recent Labs  Lab 12/09/2018 1850  12/15/18 0505  12/15/18 1850 12/16/18 0500 12/17/18 0455 12/18/18 0500 12/18/18 0829 12/19/18 0500 12/20/18 0405  NA 135   < > 138   < >  --  141 142 143 143 146* 151*  K 4.3   < > 3.7   < >  --  4.2 3.3* 3.6 3.6 4.1 3.6  CL 104  --  106  --   --  110 106 105  --  111 112*  CO2 19*  --  22  --   --  21* 27 28  --  24 29  GLUCOSE 191*  --  230*  --   --  212* 194* 234*  --  210* 144*  BUN 30*  --  36*  --   --  53* 59* 67*  --  84* 99*  CREATININE 1.87*  --  1.81*  --   --  1.61* 1.44* 1.24*  --  1.44* 1.52*  CALCIUM 8.1*  --  7.9*  --   --  7.9* 7.6* 7.7*  --  7.6* 8.0*  MG 1.9  --  2.0  --  2.0 2.1  --  1.9  --  2.2 2.3  PHOS 3.3  --  3.0  --  2.9 2.8  --   --   --   --   --    < > = values in this interval not displayed.   Estimated Creatinine Clearance: 30.9 mL/min (A) (by C-G formula based on SCr of 1.52 mg/dL (H)).   LIVER Recent Labs  Lab 12/16/18 0500 12/17/18 0455 12/18/18 0500 12/19/18 0500 12/20/18 0405  AST 33 32 32 29 22  ALT 30 30 36 35 30  ALKPHOS 68 72 102 92 76  BILITOT 0.3 0.2* 0.4 0.2* 0.1*  PROT 5.3* 5.0* 5.3* 4.7* 4.5*  ALBUMIN 2.4* 2.4* 2.5* 2.3* 2.2*     INFECTIOUS Recent Labs  Lab 12/06/2018 1850  PROCALCITON <0.10     ENDOCRINE CBG (last 3)  Recent Labs    12/19/18 2001 12/20/18 0407 12/20/18 0757  GLUCAP 202* 146* 153*         IMAGING x48h  - image(s) personally visualized  -   highlighted in bold Dg Chest Port 1 View  Result Date: 12/18/2018 CLINICAL DATA:  Shock. EXAM: PORTABLE CHEST 1 VIEW COMPARISON:  Radiograph of same day. FINDINGS: Stable cardiomediastinal silhouette. Endotracheal and feeding tubes are in good position. Right-sided PICC line is unchanged in position. No pneumothorax or pleural effusion is noted. Stable diffuse bilateral lung opacities are noted consistent with pneumonia. Bony thorax unremarkable. IMPRESSION: Stable support apparatus. Stable  bilateral lung opacities consistent with pneumonia. Aortic Atherosclerosis (ICD10-I70.0). Electronically Signed   By: Marijo Conception M.D.   On: 12/18/2018 17:20   Dg Chest Port 1v Same Day  Result Date: 12/20/2018 CLINICAL DATA:  Dyspnea, COVID-19 positive EXAM: PORTABLE CHEST 1 VIEW COMPARISON:  12/18/2018 chest radiograph. FINDINGS: Right PICC terminates in upper third of the SVC. Endotracheal tube tip is 4.1 cm above the carina. Enteric tube enters stomach with the tip not seen on this image. Stable cardiomediastinal silhouette with normal heart size. No pneumothorax. No pleural effusion. Patchy hazy opacities throughout both lungs without appreciable change. IMPRESSION: 1. Well-positioned support structures. 2. Patchy hazy opacities throughout both lungs without appreciable change, compatible with multilobar pneumonia. Electronically Signed   By: Ilona Sorrel M.D.   On: 12/20/2018 10:15   Antibiotics Given (last 72 hours)    Date/Time Action Medication Dose Rate   12/17/18 2035 New Bag/Given   remdesivir 100 mg in sodium chloride 0.9 % 250 mL IVPB 100 mg 500 mL/hr   12/18/18 2023 New Bag/Given   remdesivir 100 mg in sodium chloride 0.9 % 250 mL IVPB 100 mg 500 mL/hr       Resolved Hospital Problem list     Assessment & Plan:  ARDS due to COVID-19 pneumonia: Oxygenation worsening in last 24 hours, presumably related to worsening ARDS   12/20/2018 - > does not meet criteria for SBT/Extubation in setting of Acute Respiratory Failure due to ARDS from COVID and new concern for VAP   PLAN - contnue decadron - PRVC  - ARDS protocol - prn paralytics - give one 12/20/2018 12:06 PM - If worse, prone (nearing threshold) - Adjust RASS score to -4 with intermittent paralytic protocol.  Start Versed infusion, continue fentanyl infusion, intermittent vecuronium - sedation with opioid gtt and benzo gtt + seroquel + fent patch  Sepsis  - new on 12/20/2018  PLAN  - pan culture  - start linezolid  and cefepime (  AKI with hypernatermia   - worse on 12/20/2018  PLAN  -free water   Baseline asthma? on Fasenra as outpatient, unclear pulmonary function testing, notes from [facility] just inidcate allergic rhinitis and cough Prn albuterol  Hx of chronic Diastolic heart failure: HOld diuresis Continue to hold metoprolol and nitrates    Best practice:  Diet: tube feeding Pain/Anxiety/Delirium protocol (if indicated): as above VAP protocol (if indicated): yes DVT prophylaxis: lovenox GI prophylaxis: Pantoprazole for stress ulcer prophylaxis Glucose control: SSI Mobility: passive range of motion Code Status: full Family Communication: per [facility] Disposition: remain in ICU   ATTESTATION & SIGNATURE   The patient ARAH ARO is critically ill with multiple organ systems failure and requires high complexity decision making for assessment and support, frequent evaluation and titration of therapies, application of advanced monitoring technologies and extensive interpretation of multiple databases.   Critical Care Time devoted to patient care services described in this note is  30  Minutes. This time reflects time of care of this signee Dr Brand Males. This critical care time does not reflect procedure time, or teaching time or supervisory time of PA/NP/Med student/Med Resident etc but could involve care discussion time     Dr. Brand Males, M.D., [facility].C.P Pulmonary and Critical Care Medicine Staff Physician [facility] Pager: [phone], If no answer or between  15:00h - 7:00h: call [phone]  12/20/2018 11:55 AM

## 2018-12-20 NOTE — Progress Notes (Signed)
PROGRESS NOTE                                                                                                                                                                                                             Patient Demographics:    Jody Taylor, is a 78 y.o. female, DOB - 06/26/1940, XKG:[id number]  Outpatient Primary MD for the patient is Nicoletta Dress, MD    LOS - 6  No chief complaint on file.      Brief Narrative: Patient is a 78 y.o. female with PMHx of HTN, CAD, chronic diastolic heart failure, severe persistent asthma on benralizumab, hypothyroidism, CKD stage III-presented to [facility] with acute respiratory distress-was found to have acute hypoxic respiratory failure secondary to COVID-19 pneumonia and emergently intubated and subsequently transferred to [facility].   Subjective:    [name] today remains intubated and sedated.  Per nursing staff-no major issues overnight.   Assessment  & Plan :   Acute Hypoxic Resp Failure due to Covid 19 Viral pneumonia and superimposed ?  Bacterial pneumonia: Remained stable overnight-ventilator management per PCCM.  Hospital course complicated due to ventilator dyssynchrony requiring as needed paralytics.  Due to concern for bacterial pneumonia-PCCM has started patient on Zyvox and cefepime.Marland KitchenHas received maximal medical treatment including steroids, Remdesivir, Actemra and plasma.  Continue to follow inflammatory markers.  COVID-19 Labs:  Recent Labs    12/18/18 0500 12/19/18 0500 12/20/18 0405 12/20/18 0406  DDIMER 3.44* 6.58* 5.20*  --   FERRITIN 197 164  --  134  CRP 3.5* 1.9*  --  1.4*    No results found for: SARSCOV2NAA   COVID-19 Medications: 6/29>> Actemra 6/29>> Remdesivir 6/29>> convalescent plasma 6/29>> Solu-Medrol  Vent Settings: Vent Mode: PRVC FiO2 (%):  [50 %-70 %] 70 % Set Rate:  [28 bmp] 28 bmp Vt Set:   [300 mL] 300 mL PEEP:  [12 cmH20] 12 cmH20 Plateau Pressure:  [18 cmH20-22 cmH20] 18 cmH20  ABG:    Component Value Date/Time   PHART 7.424 12/18/2018 0829   PCO2ART 41.7 12/18/2018 0829   PO2ART 58.0 (L) 12/18/2018 0829   HCO3 27.4 12/18/2018 0829   TCO2 29 12/18/2018 0829   ACIDBASEDEF 5.0 (H) 12/15/2018 1501   O2SAT 91.0 12/18/2018 0829    Steroid-induced hyperglycemia on top of prediabetes: CBGs stable-continue  20 months of Levemir and SSI-follow and adjust accordingly.  A1c 6.4.  HTN: BP stable-continue with as needed hydralazine  Hypernatremia: Have increased free water via tube-and have started on half-normal saline.  Repeat electrolytes tomorrow.  CKD stage III: Creatinine close to usual baseline-follow  CAD: No anginal symptoms evident-continue aspirin  Chronic diastolic heart failure: This appears stable-continue Lasix-follow renal function, I/O's and daily weights  History of severe persistent asthma: No wheezing-continue bronchodilators-on benralizumab as outpatient.  Dyslipidemia: Continue statin  Hypothyroidism: Continue Synthroid  Dyslipidemia: Continue statin  GERD: Continue PPI  Anxiety/depression: continue Seroquel  Condition - Extremely Guarded  Family Communication  : Daughter updated over the phone.  Family understand the critical condition of this patient-with concern for superimposed bacterial pneumonia.  Family realize that if her condition worsens any further-there is a good chance that she may not survive this hospitalization.  Code Status : DNR  Diet :  Diet Order    None       Disposition Plan  :  Remain inpatient  Consults  :  PCCM  Procedures  :    ETT>> 6/29 Right IJ placed at [facility] 6/29  GI prophylaxis: PPI  DVT Prophylaxis  :  Lovenox twice daily dosing-watch d-dimer  Lab Results  Component Value Date   PLT 226 12/20/2018    Inpatient Medications  Scheduled Meds: . sodium chloride   Intravenous  Once  . artificial tears  1 application Both Eyes T3M  . aspirin  81 mg Per Tube Daily  . chlorhexidine gluconate (MEDLINE KIT)  15 mL Mouth Rinse BID  . Chlorhexidine Gluconate Cloth  6 each Topical Daily  . clonazePAM  1 mg Oral BID  . dexamethasone (DECADRON) injection  6 mg Intravenous Q12H  . enoxaparin (LOVENOX) injection  40 mg Subcutaneous Q12H  . fentaNYL  1 patch Transdermal Q72H  . free water  300 mL Per Tube Q4H  . insulin aspart  0-15 Units Subcutaneous Q4H  . insulin detemir  20 Units Subcutaneous Daily  . levothyroxine  50 mcg Intravenous Daily  . mouth rinse  15 mL Mouth Rinse 10 times per day  . pantoprazole sodium  40 mg Per Tube Daily  . QUEtiapine  50 mg Oral BID  . sodium chloride flush  10-40 mL Intracatheter Q12H  . vitamin C  500 mg Per Tube Daily  . zinc sulfate  220 mg Per Tube Daily   Continuous Infusions: . sodium chloride 50 mL/hr at 12/20/18 1200  . ceFEPime (MAXIPIME) IV 2 g (12/20/18 1410)  . feeding supplement (VITAL HIGH PROTEIN) 1,000 mL (12/20/18 1400)  . fentaNYL infusion INTRAVENOUS 200 mcg/hr (12/20/18 1200)  . linezolid (ZYVOX) IV 600 mg (12/20/18 1300)  . midazolam 4 mg/hr (12/20/18 1200)   PRN Meds:.acetaminophen, fentaNYL, hydrALAZINE, ipratropium-albuterol, nitroGLYCERIN, [DISCONTINUED] ondansetron **OR** ondansetron (ZOFRAN) IV, polyethylene glycol, vecuronium  Antibiotics  :    Anti-infectives (From admission, onward)   Start     Dose/Rate Route Frequency Ordered Stop   12/20/18 1230  linezolid (ZYVOX) IVPB 600 mg     600 mg 300 mL/hr over 60 Minutes Intravenous Every 12 hours 12/20/18 1210     12/20/18 1230  ceFEPIme (MAXIPIME) 2 g in sodium chloride 0.9 % 100 mL IVPB     2 g 200 mL/hr over 30 Minutes Intravenous Every 12 hours 12/20/18 1229     12/15/18 2000  remdesivir 100 mg in sodium chloride 0.9 % 250 mL IVPB     100  mg 500 mL/hr over 30 Minutes Intravenous Every 24 hours 12/02/2018 1845 12/18/18 2053   11/19/2018 2000   remdesivir 200 mg in sodium chloride 0.9 % 250 mL IVPB     200 mg 500 mL/hr over 30 Minutes Intravenous Once 11/27/2018 1845 11/22/2018 2116       Time Spent in minutes  45   Oren Binet M.D on 12/20/2018 at 2:27 PM  To page go to [email or web address] - use universal password  [facility] -  Office  [phone]  The patient is critically ill with multiple organ system failure and requires high complexity decision making for assessment and support, frequent evaluation and titration of therapies, advanced monitoring, review of radiographic studies and interpretation of complex data.   See all Orders from today for further details   Admit date - 11/22/2018    6    Objective:   Vitals:   12/20/18 1206 12/20/18 1300 12/20/18 1330 12/20/18 1400  BP: 127/67 134/83 134/81 132/63  Pulse: (!) 106 (!) 113 (!) 117 (!) 120  Resp: (!) 34 _0 Temp: (!) 100.8 F (38.2 C)     TempSrc:      SpO2: (!) 88% (!) 89% 92% 93%  Weight:      Height:        Wt Readings from Last 3 Encounters:  12/19/18 94.1 kg  03/02/18 95.5 kg  01/13/18 95.7 kg     Intake/Output Summary (Last 24 hours) at 12/20/2018 1427 Last data filed at 12/20/2018 1200 Gross per 24 hour  Intake 3225.04 ml  Output 2345 ml  Net 880.04 ml     Physical Exam General appearance: Intubated sedated Eyes:no scleral icterus. HEENT: Atraumatic and Normocephalic Neck: supple, no JVD. Resp:Good air entry bilaterally,no added sounds heard anteriorly CVS: S1 S2 regular, no murmurs.  GI: Bowel sounds present, Non tender and not distended with no gaurding, rigidity or rebound. Extremities: B/L Lower Ext shows no edema, both legs are warm to touch    Data Review:    CBC Recent Labs  Lab 12/15/18 0505  12/16/18 0500 12/17/18 0455 12/18/18 0500 12/18/18 0829 12/19/18 0500 12/20/18 0405  WBC 3.4*  --  7.2 6.4 6.3  --  7.8 6.7  HGB 11.4*   < > 11.0* 11.2* 12.8 12.2 11.6* 11.8*  HCT 35.5*   < > 34.8* 34.3* 39.5  36.0 38.3 37.9  PLT 180  --  205 220 238  --  230 226  MCV 94.9  --  95.6 95.3 94.3  --  98.5 100.0  MCH 30.5  --  30.2 31.1 30.5  --  29.8 31.1  MCHC 32.1  --  31.6 32.7 32.4  --  30.3 31.1  RDW 14.7  --  14.6 14.4 14.2  --  14.4 14.6  LYMPHSABS 0.4*  --  0.4* 0.5* 0.5*  --  0.4*  --   MONOABS 0.1  --  0.5 0.5 0.5  --  0.5  --   EOSABS 0.0  --  0.0 0.0 0.0  --  0.0  --   BASOSABS 0.0  --  0.0 0.0 0.0  --  0.0  --    < > = values in this interval not displayed.    Chemistries  Recent Labs  Lab 12/15/18 1850 12/16/18 0500 12/17/18 0455 12/18/18 0500 12/18/18 0829 12/19/18 0500 12/20/18 0405  NA  --  141 142 143 143 146* 151*  K  --  4.2 3.3* 3.6 3.6 4.1 3.6  CL  --  110 106 105  --  111 112*  CO2  --  21* 27 28  --  24 29  GLUCOSE  --  212* 194* 234*  --  210* 144*  BUN  --  53* 59* 67*  --  84* 99*  CREATININE  --  1.61* 1.44* 1.24*  --  1.44* 1.52*  CALCIUM  --  7.9* 7.6* 7.7*  --  7.6* 8.0*  MG 2.0 2.1  --  1.9  --  2.2 2.3  AST  --  33 32 32  --  29 22  ALT  --  30 30 36  --  35 30  ALKPHOS  --  68 72 102  --  92 76  BILITOT  --  0.3 0.2* 0.4  --  0.2* 0.1*   ------------------------------------------------------------------------------------------------------------------ Recent Labs    12/18/18 0500 12/19/18 0500  TRIG 429* 404*    Lab Results  Component Value Date   HGBA1C 6.4 (H) 12/20/2018   ------------------------------------------------------------------------------------------------------------------ No results for input(s): TSH, T4TOTAL, T3FREE, THYROIDAB in the last 72 hours.  Invalid input(s): FREET3 ------------------------------------------------------------------------------------------------------------------ Recent Labs    12/19/18 0500 12/20/18 0406  FERRITIN 164 134    Coagulation profile No results for input(s): INR, PROTIME in the last 168 hours.  Recent Labs    12/19/18 0500 12/20/18 0405  DDIMER 6.58* 5.20*    Cardiac  Enzymes No results for input(s): CKMB, TROPONINI, MYOGLOBIN in the last 168 hours.  Invalid input(s): CK ------------------------------------------------------------------------------------------------------------------    Component Value Date/Time   BNP 240.7 (H) 12/16/2018 1300    Micro Results Recent Results (from the past 240 hour(s))  MRSA PCR Screening     Status: None   Collection Time: 12/15/2018  5:57 PM   Specimen: Nasal Mucosa; Nasopharyngeal  Result Value Ref Range Status   MRSA by PCR NEGATIVE NEGATIVE Final    Comment:        The GeneXpert MRSA Assay (FDA approved for NASAL specimens only), is one component of a comprehensive MRSA colonization surveillance program. It is not intended to diagnose MRSA infection nor to guide or monitor treatment for MRSA infections. Performed at [facility], [place] [address], [place], [place] [address]     Radiology Reports Dg Abd 1 View  Result Date: 11/27/2018 CLINICAL DATA:  Orogastric tube placement. EXAM: ABDOMEN - 1 VIEW COMPARISON:  None FINDINGS: Orogastric tube tip is in the distal body of the stomach. No dilated bowel.  Previous lumbar fusion at L5-S1. IMPRESSION: OG tube tip is in the distal stomach. Electronically Signed   By: Lorriane Shire M.D.   On: 11/22/2018 20:47   Dg Chest Port 1 View  Result Date: 12/18/2018 CLINICAL DATA:  Shock. EXAM: PORTABLE CHEST 1 VIEW COMPARISON:  Radiograph of same day. FINDINGS: Stable cardiomediastinal silhouette. Endotracheal and feeding tubes are in good position. Right-sided PICC line is unchanged in position. No pneumothorax or pleural effusion is noted. Stable diffuse bilateral lung opacities are noted consistent with pneumonia. Bony thorax unremarkable. IMPRESSION: Stable support apparatus. Stable bilateral lung opacities consistent with pneumonia. Aortic Atherosclerosis (ICD10-I70.0). Electronically Signed   By: Marijo Conception M.D.   On: 12/18/2018 17:20   Dg  Chest Port 1 View  Result Date: 12/18/2018 CLINICAL DATA:  Shortness of breath, pneumonia, COVID-19 virus EXAM: PORTABLE CHEST 1 VIEW COMPARISON:  12/15/2018 FINDINGS: Endotracheal tube 3.2 cm above the carina. Exam is rotated to the left. Right upper extremity PICC line tip mid  SVC level. Stable cardiomegaly and diffuse mixed interstitial and airspace opacities throughout both lungs. Low lung volumes persist. No significant change in aeration. No developing large effusion or pneumothorax. Aorta atherosclerotic. IMPRESSION: Stable cardiomegaly and diffuse mixed interstitial and airspace process compatible with pneumonia/viral pneumonia. Electronically Signed   By: Jerilynn Mages.  Shick M.D.   On: 12/18/2018 11:12   Dg Chest Port 1 View  Result Date: 12/15/2018 CLINICAL DATA:  Pulmonary infiltrates. Endotracheally intubated. COVID-19. EXAM: PORTABLE CHEST 1 VIEW 10:25 a.m. COMPARISON:  12/15/2018 at 5:09 a.m. and 11/18/2018 FINDINGS: Endotracheal tube is in good position 4 cm above the carina. NG tube tip is below the diaphragm. Central line tip is in the superior vena cava just below the carina, unchanged. Extensive bilateral pulmonary infiltrates persist, slightly increased at the left base. No effusions.  No acute bone abnormality. IMPRESSION: Slight progression of pulmonary infiltrates at the left base. No other change. Electronically Signed   By: Lorriane Shire M.D.   On: 12/15/2018 11:15   Portable Chest 1 View  Result Date: 12/15/2018 CLINICAL DATA:  Shortness of breath.  COVID-19. EXAM: PORTABLE CHEST 1 VIEW COMPARISON:  12/04/2018. FINDINGS: Patient is rotated to the right. Endotracheal tube tip noted just above the right mainstem bronchus. Retraction of approximately 2 cm suggested. NG tube noted with tip below left hemidiaphragm. Right IJ line stable position. Heart size stable. Diffuse severe bilat interstitial infiltrates are again noted. Persistent low lung volumes. No pleural effusion or pneumothorax.  IMPRESSION: 1. Endotracheal tube tip is noted just above the right mainstem bronchus. Retraction of approximately 2 cm suggested. NG tube and right IJ line stable position. 2. Diffuse severe bilateral pulmonary interstitial infiltrates again noted. Persistent low lung volumes. Critical Value/emergent results were called by telephone at the time of interpretation on 12/15/2018 at 7:01 am to nurse Elmyra Ricks, who verbally acknowledged these results. Electronically Signed   By: Marcello Moores  Register   On: 12/15/2018 07:02   Dg Chest Port 1 View  Result Date: 11/22/2018 CLINICAL DATA:  Acute respiratory failure with hypoxia. EXAM: PORTABLE CHEST 1 VIEW COMPARISON:  December 14, 2018 FINDINGS: The endotracheal tube terminates above the carina by approximately 2.1 cm. The right-sided central venous catheter is well position. The enteric tube extends below the left hemidiaphragm. The heart size is enlarged. Aortic calcifications are noted. Again seen are diffuse bilateral hazy airspace opacities with some significant improvement from prior study. This may be in part due to approved imaging technique. There is no definite pneumothorax. There are likely small bilateral pleural effusions. IMPRESSION: 1. Lines and tubes as above. 2. Persistent but slightly improved multifocal airspace opacities which can be seen in patients with pulmonary edema or an atypical infectious process. Electronically Signed   By: Constance Holster M.D.   On: 11/25/2018 19:07   Dg Chest Port 1v Same Day  Result Date: 12/20/2018 CLINICAL DATA:  Dyspnea, COVID-19 positive EXAM: PORTABLE CHEST 1 VIEW COMPARISON:  12/18/2018 chest radiograph. FINDINGS: Right PICC terminates in upper third of the SVC. Endotracheal tube tip is 4.1 cm above the carina. Enteric tube enters stomach with the tip not seen on this image. Stable cardiomediastinal silhouette with normal heart size. No pneumothorax. No pleural effusion. Patchy hazy opacities throughout both lungs without  appreciable change. IMPRESSION: 1. Well-positioned support structures. 2. Patchy hazy opacities throughout both lungs without appreciable change, compatible with multilobar pneumonia. Electronically Signed   By: Ilona Sorrel M.D.   On: 12/20/2018 10:15   [place] Ekg Site Rite  Result  Date: 12/04/2018 If Site Rite image not attached, placement could not be confirmed due to current cardiac rhythm.

## 2018-12-20 NOTE — Progress Notes (Signed)
Pt changed to PC d/t desynchrony with vent and odd breathing pattern which was corrected with this change. RT will follow up with ABG in 1 hour.

## 2018-12-20 NOTE — Progress Notes (Signed)
Pharmacy Antibiotic Note  Jody Taylor is a 78 y.o. female admitted on 12/12/2018 with hypoxic respiratory failure, +COVID-19, intubated at [facility] before transfer to [place].   Concern for new VAP developing, Tm 101, vent dyssynchrony. Pharmacy has been consulted for Cefepime dosing, Linezolid per PCCM.  Plan: Cefepime 2g IV q12h Linezolid 600 mg IV q12h. Follow up renal function, culture results, and clinical course.   Height: 4\' 10"  (147.3 cm) Weight: 207 lb 7.3 oz (94.1 kg) IBW/kg (Calculated) : 40.9  Temp (24hrs), Avg:99.6 F (37.6 C), Min:99 F (37.2 C), Max:100.8 F (38.2 C)  Recent Labs  Lab 12/16/18 0500 12/17/18 0455 12/18/18 0500 12/19/18 0500 12/20/18 0405  WBC 7.2 6.4 6.3 7.8 6.7  CREATININE 1.61* 1.44* 1.24* 1.44* 1.52*    Estimated Creatinine Clearance: 30.9 mL/min (A) (by C-G formula based on SCr of 1.52 mg/dL (H)).    Allergies  Allergen Reactions  . Ace Inhibitors Other (See Comments) and Cough    CHEST PAIN  . Other     Patient reports receiving blood after a miscarriage "years ago". She states she broke out from receiving this blood. Has not received any since  . Codeine Nausea And Vomiting  . Levaquin [Levofloxacin] Nausea And Vomiting    Antimicrobials this admission: 6/29 Remdesivir >>7/3 6/29 Actemra 6/30 cPlasma 7/5 Linezolid >> 7/5 Cefepime >>   Dose adjustments this admission:  Microbiology results: 6/29 MRSA PCR: negative 7/5 TA:  7/5 BCx: 7/5 UCx:   Thank you for allowing pharmacy to be a part of this patient's care.  Gretta Arab PharmD, BCPS Clinical pharmacist phone 7am- 5pm: [phone] 12/20/2018 12:26 PM

## 2018-12-20 NOTE — Progress Notes (Signed)
Updated daughter and grand-daughter on patient all questions answered at this time.

## 2018-12-20 NOTE — Progress Notes (Signed)
Face time with family. All questions are answered.

## 2018-12-21 ENCOUNTER — Inpatient Hospital Stay (HOSPITAL_COMMUNITY): Payer: Medicare HMO

## 2018-12-21 DIAGNOSIS — R7989 Other specified abnormal findings of blood chemistry: Secondary | ICD-10-CM

## 2018-12-21 DIAGNOSIS — M7989 Other specified soft tissue disorders: Secondary | ICD-10-CM

## 2018-12-21 DIAGNOSIS — U071 COVID-19: Secondary | ICD-10-CM

## 2018-12-21 LAB — MAGNESIUM: Magnesium: 2.2 mg/dL (ref 1.7–2.4)

## 2018-12-21 LAB — POCT I-STAT 7, (LYTES, BLD GAS, ICA,H+H)
Acid-Base Excess: 2 mmol/L (ref 0.0–2.0)
Acid-Base Excess: 2 mmol/L (ref 0.0–2.0)
Bicarbonate: 28.5 mmol/L — ABNORMAL HIGH (ref 20.0–28.0)
Bicarbonate: 25.9 mmol/L (ref 20.0–28.0)
Calcium, Ion: 1.23 mmol/L (ref 1.15–1.40)
Calcium, Ion: 1.25 mmol/L (ref 1.15–1.40)
HCT: 31 % — ABNORMAL LOW (ref 36.0–46.0)
HCT: 31 % — ABNORMAL LOW (ref 36.0–46.0)
Hemoglobin: 10.5 g/dL — ABNORMAL LOW (ref 12.0–15.0)
Hemoglobin: 10.5 g/dL — ABNORMAL LOW (ref 12.0–15.0)
O2 Saturation: 91 %
O2 Saturation: 92 %
Patient temperature: 37.5
Patient temperature: 37.5
Potassium: 4.1 mmol/L (ref 3.5–5.1)
Potassium: 3.9 mmol/L (ref 3.5–5.1)
Sodium: 146 mmol/L — ABNORMAL HIGH (ref 135–145)
Sodium: 146 mmol/L — ABNORMAL HIGH (ref 135–145)
TCO2: 30 mmol/L (ref 22–32)
TCO2: 27 mmol/L (ref 22–32)
pCO2 arterial: 54.6 mmHg — ABNORMAL HIGH (ref 32.0–48.0)
pCO2 arterial: 38.3 mmHg (ref 32.0–48.0)
pH, Arterial: 7.328 — ABNORMAL LOW (ref 7.350–7.450)
pH, Arterial: 7.439 (ref 7.350–7.450)
pO2, Arterial: 70 mmHg — ABNORMAL LOW (ref 83.0–108.0)
pO2, Arterial: 61 mmHg — ABNORMAL LOW (ref 83.0–108.0)

## 2018-12-21 LAB — GLUCOSE, CAPILLARY
Glucose-Capillary: 128 mg/dL — ABNORMAL HIGH (ref 70–99)
Glucose-Capillary: 137 mg/dL — ABNORMAL HIGH (ref 70–99)
Glucose-Capillary: 152 mg/dL — ABNORMAL HIGH (ref 70–99)
Glucose-Capillary: 210 mg/dL — ABNORMAL HIGH (ref 70–99)
Glucose-Capillary: 211 mg/dL — ABNORMAL HIGH (ref 70–99)
Glucose-Capillary: 213 mg/dL — ABNORMAL HIGH (ref 70–99)
Glucose-Capillary: 225 mg/dL — ABNORMAL HIGH (ref 70–99)
Glucose-Capillary: 285 mg/dL — ABNORMAL HIGH (ref 70–99)

## 2018-12-21 LAB — COMPREHENSIVE METABOLIC PANEL
ALT: 23 U/L (ref 0–44)
AST: 26 U/L (ref 15–41)
Albumin: 2.1 g/dL — ABNORMAL LOW (ref 3.5–5.0)
Alkaline Phosphatase: 82 U/L (ref 38–126)
Anion gap: 10 (ref 5–15)
BUN: 79 mg/dL — ABNORMAL HIGH (ref 8–23)
CO2: 26 mmol/L (ref 22–32)
Calcium: 8.2 mg/dL — ABNORMAL LOW (ref 8.9–10.3)
Chloride: 112 mmol/L — ABNORMAL HIGH (ref 98–111)
Creatinine, Ser: 1.31 mg/dL — ABNORMAL HIGH (ref 0.44–1.00)
GFR calc Af Amer: 46 mL/min — ABNORMAL LOW (ref >60)
GFR calc non Af Amer: 39 mL/min — ABNORMAL LOW (ref >60)
Glucose, Bld: 240 mg/dL — ABNORMAL HIGH (ref 70–99)
Potassium: 3.9 mmol/L (ref 3.5–5.1)
Sodium: 148 mmol/L — ABNORMAL HIGH (ref 135–145)
Total Bilirubin: 0.2 mg/dL — ABNORMAL LOW (ref 0.3–1.2)
Total Protein: 4.5 g/dL — ABNORMAL LOW (ref 6.5–8.1)

## 2018-12-21 LAB — CBC
HCT: 35.8 % — ABNORMAL LOW (ref 36.0–46.0)
Hemoglobin: 11.1 g/dL — ABNORMAL LOW (ref 12.0–15.0)
MCH: 30.9 pg (ref 26.0–34.0)
MCHC: 31 g/dL (ref 30.0–36.0)
MCV: 99.7 fL (ref 80.0–100.0)
Platelets: 200 10*3/uL (ref 150–400)
RBC: 3.59 MIL/uL — ABNORMAL LOW (ref 3.87–5.11)
RDW: 14.5 % (ref 11.5–15.5)
WBC: 7.2 10*3/uL (ref 4.0–10.5)
nRBC: 0 % (ref 0.0–0.2)

## 2018-12-21 LAB — D-DIMER, QUANTITATIVE: D-Dimer, Quant: 6.8 ug/mL-FEU — ABNORMAL HIGH (ref 0.00–0.50)

## 2018-12-21 LAB — C-REACTIVE PROTEIN: CRP: <0.8 mg/dL (ref <1.0)

## 2018-12-21 LAB — PROCALCITONIN: Procalcitonin: <0.1 ng/mL

## 2018-12-21 LAB — FERRITIN: Ferritin: 146 ng/mL (ref 11–307)

## 2018-12-21 MED ORDER — FREE WATER
350.0000 mL | Status: DC
  Administered 2018-12-21 – 2018-12-29 (×48): 350 mL

## 2018-12-21 NOTE — Progress Notes (Signed)
After decreasing, and then stopping sedation, pt continuously not responding. Not even with facial muscles as during am assessment. MD called and notified. Will do CT

## 2018-12-21 NOTE — Progress Notes (Signed)
NAME:  Jody Taylor, MRN:  [id number], DOB:  Oct 10, 1940, LOS: 7 ADMISSION DATE:  12/04/2018, CONSULTATION DATE:  12/11/2018 REFERRING MD:  Sloan Leiter, CHIEF COMPLAINT:  Dyspnea   Brief History   Female with a history of diastolic heart failure admitted on June 29 for ARDS in the setting of COVID-19 pneumonia.  Required intubation at [facility] emergency room.  Past Medical History  Diastolic heart failure History of stroke (mini stroke) Obstructive sleep apnea on CPAP Hypertension Hyperlipidemia Coronary artery disease, had PCI in 0272 Chronic diastolic heart failure Chronic kidney disease GERD Allergic rhinitis  Significant Hospital Events   June 29 admission July 2, severe vent dyssynchrony, multiple ventilator changes, pressure support, SIMV July 3, oxygenation worsening overnight, tidal volume 12cc/kg IBW on SIMV July 4 -  oxygenation worsening overnight, tidal volume 12cc/kg IBW on SIMV July 5 - December 20, 2018- on Versed and fentanyl drips.  Last paralytic dose was 48 hours ago.  On 50% FiO2 with a PEEP of 12 -> PO2 58.  Making urine but mild AKI with a creatinine of 1.52 and rising sodium to 151...  Concern from previous days that she might have developed some ventilator associated lung injury.  Overall no change.  Currently in supine ventilation.  Off levophed. Noted at the time of this dictation patient got dyssynchronous with the ventilator and desaturated.  Order for as needed paralytic given. Having rising temperature particularly since December 18, 2018 with a T-max of 101 today  Consults:  Pulmonary and critical care medicine  Procedures:  June 29 endotracheal tube> June 29 right internal jugular central venous line>   Significant Diagnostic Tests:    Micro Data:  June 26 SARS-CoV-2 positive  Antimicrobials:  June 29 remdesivir June 29 Actemra June 29 solumedrol June 29 convalescent plasma   Interim history/subjective:   7/6 - fent 200, versed 2mg  gtt.  Needed  prn vec x 2 last ngiht -> changed to PCV and etter.  Now supine . 40% fio2, peep 12 -> pulse ox 91%. Not on perssors . Making urine.   Objective   Blood pressure (!) 126/59, pulse 81, temperature 99.7 F (37.6 C), resp. rate (!) 28, height 4\' 10"  (1.473 m), weight 96.9 kg, SpO2 90 %.    Vent Mode: PCV FiO2 (%):  [40 %-70 %] 40 % Set Rate:  [28 bmp] 28 bmp Vt Set:  [300 mL] 300 mL PEEP:  [12 cmH20] 12 cmH20 Plateau Pressure:  [18 cmH20-28 cmH20] 26 cmH20   Intake/Output Summary (Last 24 hours) at 12/21/2018 1204 Last data filed at 12/21/2018 1100 Gross per 24 hour  Intake 5307.31 ml  Output 2045 ml  Net 3262.31 ml   Filed Weights   12/18/18 0500 12/19/18 0500 12/21/18 0500  Weight: 91.9 kg 94.1 kg 96.9 kg  General Appearance:  Looks criticall ill Head:  Normocephalic, without obvious abnormality, atraumatic Eyes:  PERRL - yes, conjunctiva/corneas - muddy     Ears:  Normal external ear canals, both ears Nose:  G tube - no Throat:  ETT TUBE - yes , OG tube - yes Neck:  Supple,  No enlargement/tenderness/nodules Lungs: crackles, Ventilator   Synchrony - yes Heart:  S1 and S2 normal, no murmur, CVP - no.  Pressors - no Abdomen:  Soft, no masses, no organomegaly Genitalia / Rectal:  Not done Extremities:  Extremities- intact with deema Skin:  ntact in exposed areas . Sacral area - not examined Neurologic:  Sedation - sedation gtt -> RASS - -  3 .        LABS    PULMONARY Recent Labs  Lab 12/15/18 0447 12/15/18 1501 12/18/18 0829 12/21/18 0007 12/21/18 0452  PHART 7.389 7.311* 7.424 7.328* 7.439  PCO2ART 34.9 41.5 41.7 54.6* 38.3  PO2ART 101.0 76.0* 58.0* 70.0* 61.0*  HCO3 21.1 20.9 27.4 28.5* 25.9  TCO2 22 22 29 30 27   O2SAT 98.0 94.0 91.0 91.0 92.0    CBC Recent Labs  Lab 12/19/18 0500 12/20/18 0405 12/21/18 0007 12/21/18 0450 12/21/18 0452  HGB 11.6* 11.8* 10.5* 11.1* 10.5*  HCT 38.3 37.9 31.0* 35.8* 31.0*  WBC 7.8 6.7  --  7.2  --   PLT 230 226  --  200   --     COAGULATION No results for input(s): INR in the last 168 hours.  CARDIAC  No results for input(s): TROPONINI in the last 168 hours. No results for input(s): PROBNP in the last 168 hours.   CHEMISTRY Recent Labs  Lab 12/09/2018 1850  12/15/18 0505  12/15/18 1850 12/16/18 0500 12/17/18 0455 12/18/18 0500  12/19/18 0500 12/20/18 0405 12/21/18 0007 12/21/18 0450 12/21/18 0452  NA 135   < > 138   < >  --  141 142 143   < > 146* 151* 146* 148* 146*  K 4.3   < > 3.7   < >  --  4.2 3.3* 3.6   < > 4.1 3.6 4.1 3.9 3.9  CL 104  --  106  --   --  110 106 105  --  111 112*  --  112*  --   CO2 19*  --  22  --   --  21* 27 28  --  24 29  --  26  --   GLUCOSE 191*  --  230*  --   --  212* 194* 234*  --  210* 144*  --  240*  --   BUN 30*  --  36*  --   --  53* 59* 67*  --  84* 99*  --  79*  --   CREATININE 1.87*  --  1.81*  --   --  1.61* 1.44* 1.24*  --  1.44* 1.52*  --  1.31*  --   CALCIUM 8.1*  --  7.9*  --   --  7.9* 7.6* 7.7*  --  7.6* 8.0*  --  8.2*  --   MG 1.9  --  2.0  --  2.0 2.1  --  1.9  --  2.2 2.3  --  2.2  --   PHOS 3.3  --  3.0  --  2.9 2.8  --   --   --   --   --   --   --   --    < > = values in this interval not displayed.   Estimated Creatinine Clearance: 36.5 mL/min (A) (by C-G formula based on SCr of 1.31 mg/dL (H)).   LIVER Recent Labs  Lab 12/17/18 0455 12/18/18 0500 12/19/18 0500 12/20/18 0405 12/21/18 0450  AST 32 32 29 22 26   ALT 30 36 35 30 23  ALKPHOS 72 102 92 76 82  BILITOT 0.2* 0.4 0.2* 0.1* 0.2*  PROT 5.0* 5.3* 4.7* 4.5* 4.5*  ALBUMIN 2.4* 2.5* 2.3* 2.2* 2.1*     INFECTIOUS Recent Labs  Lab 11/17/2018 1850 12/20/18 1330 12/21/18 0450  LATICACIDVEN  --  1.3  --   PROCALCITON <0.10 <0.10 <0.10  ENDOCRINE CBG (last 3)  Recent Labs    12/20/18 2327 12/21/18 0354 12/21/18 0740  GLUCAP 255* 225* 210*         IMAGING x48h  - image(s) personally visualized  -   highlighted in bold Dg Chest Port 1v Same Day  Result  Date: 12/20/2018 CLINICAL DATA:  Dyspnea, COVID-19 positive EXAM: PORTABLE CHEST 1 VIEW COMPARISON:  12/18/2018 chest radiograph. FINDINGS: Right PICC terminates in upper third of the SVC. Endotracheal tube tip is 4.1 cm above the carina. Enteric tube enters stomach with the tip not seen on this image. Stable cardiomediastinal silhouette with normal heart size. No pneumothorax. No pleural effusion. Patchy hazy opacities throughout both lungs without appreciable change. IMPRESSION: 1. Well-positioned support structures. 2. Patchy hazy opacities throughout both lungs without appreciable change, compatible with multilobar pneumonia. Electronically Signed   By: Ilona Sorrel M.D.   On: 12/20/2018 10:15   Vas [place] Lower Extremity Venous (dvt)  Result Date: 12/21/2018  Lower Venous Study Indications: Swelling, and hypoxic-intubated and elevated d-dimer. and positive for Covid-19.  Comparison Study: No prior. Performing Technologist: Oda Cogan RDMS, RVT  Examination Guidelines: A complete evaluation includes B-mode imaging, spectral Doppler, color Doppler, and power Doppler as needed of all accessible portions of each vessel. Bilateral testing is considered an integral part of a complete examination. Limited examinations for reoccurring indications may be performed as noted.  +---------+---------------+---------+-----------+----------+-------+  RIGHT     Compressibility Phasicity Spontaneity Properties Summary  +---------+---------------+---------+-----------+----------+-------+  CFV       Full            Yes       Yes                             +---------+---------------+---------+-----------+----------+-------+  SFJ       Full                                                      +---------+---------------+---------+-----------+----------+-------+  FV Prox   Full                                                      +---------+---------------+---------+-----------+----------+-------+  FV Mid    Full                                                       +---------+---------------+---------+-----------+----------+-------+  FV Distal Full                                                      +---------+---------------+---------+-----------+----------+-------+  PFV       Full                                                      +---------+---------------+---------+-----------+----------+-------+  POP       Full            Yes       Yes                             +---------+---------------+---------+-----------+----------+-------+  PTV       Full                                                      +---------+---------------+---------+-----------+----------+-------+  PERO      Full                                                      +---------+---------------+---------+-----------+----------+-------+   +---------+---------------+---------+-----------+----------+-------+  LEFT      Compressibility Phasicity Spontaneity Properties Summary  +---------+---------------+---------+-----------+----------+-------+  CFV       Full            Yes       Yes                             +---------+---------------+---------+-----------+----------+-------+  SFJ       Full                                                      +---------+---------------+---------+-----------+----------+-------+  FV Prox   Full                                                      +---------+---------------+---------+-----------+----------+-------+  FV Mid    Full                                                      +---------+---------------+---------+-----------+----------+-------+  FV Distal Full                                                      +---------+---------------+---------+-----------+----------+-------+  PFV       Full                                                      +---------+---------------+---------+-----------+----------+-------+  POP       Full            Yes       Yes                              +---------+---------------+---------+-----------+----------+-------+  PTV       Full                                                      +---------+---------------+---------+-----------+----------+-------+  PERO      Full                                                      +---------+---------------+---------+-----------+----------+-------+     Summary: Right: There is no evidence of deep vein thrombosis in the lower extremity. Left: There is no evidence of deep vein thrombosis in the lower extremity.  *See table(s) above for measurements and observations.    Preliminary    Antibiotics Given (last 72 hours)    Date/Time Action Medication Dose Rate   12/18/18 2023 New Bag/Given   remdesivir 100 mg in sodium chloride 0.9 % 250 mL IVPB 100 mg 500 mL/hr   12/20/18 1300 New Bag/Given   linezolid (ZYVOX) IVPB 600 mg 600 mg 300 mL/hr   12/20/18 1410 New Bag/Given   ceFEPIme (MAXIPIME) 2 g in sodium chloride 0.9 % 100 mL IVPB 2 g 200 mL/hr   12/20/18 2118 New Bag/Given   ceFEPIme (MAXIPIME) 2 g in sodium chloride 0.9 % 100 mL IVPB 2 g 200 mL/hr   12/20/18 2240 New Bag/Given   linezolid (ZYVOX) IVPB 600 mg 600 mg 300 mL/hr   12/21/18 7026 New Bag/Given   ceFEPIme (MAXIPIME) 2 g in sodium chloride 0.9 % 100 mL IVPB 2 g 200 mL/hr       Resolved Hospital Problem list     Assessment & Plan:  ARDS due to COVID-19 pneumonia: Oxygenation worsening in last 24 hours, presumably related to worsening ARDS  12/21/2018 - > does not meet criteria for SBT/Extubation in setting of Acute Respiratory Failure due to  ARDS from covid 19 and vent dysnchronly    PLAN - contnue decadron - PRVC  - ARDS protocol - prn paralytics -  - If worse, prone (nearing threshold) - Adjust RASS score to -4 with intermittent paralytic protocol.  Start Versed infusion, continue fentanyl infusion, intermittent vecuronium - sedation with opioid gtt and benzo gtt + seroquel + fent patch  Sepsis witg ever  - new on 12/20/2018,  results pending 7/6 but PCT negative x 3  PLAN  - await  - dc  linezolid - continue cefepime but low threshold to dc 7/7/0-2  AKI with hypernatermia   - improved 7/6  PLAN  -free water   Baseline asthma? on Fasenra as outpatient, unclear pulmonary function testing, notes from [place] [facility] just inidcate allergic rhinitis and cough Prn albuterol  Hx of chronic Diastolic heart failure: HOld diuresis Continue to hold metoprolol and nitrates    Best practice:  Diet: tube feeding Pain/Anxiety/Delirium protocol (if indicated): as above VAP protocol (if indicated): yes DVT prophylaxis: lovenox GI prophylaxis: Pantoprazole for stress ulcer prophylaxis Glucose control: SSI Mobility: passive range of motion Code Status: full Family Communication: per triad Disposition: remain in ICU   ATTESTATION & SIGNATURE   The patient ALENA BLANKENBECKLER is critically ill with multiple organ systems failure and requires high complexity decision making for assessment and support, frequent evaluation and  titration of therapies, application of advanced monitoring technologies and extensive interpretation of multiple databases.   Critical Care Time devoted to patient care services described in this note is  30  Minutes. This time reflects time of care of this signee Dr Brand Males. This critical care time does not reflect procedure time, or teaching time or supervisory time of PA/NP/Med student/Med Resident etc but could involve care discussion time     Dr. Brand Males, M.D., [facility].C.P Pulmonary and Critical Care Medicine Staff Physician [facility] Pager: [phone], If no answer or between  15:00h - 7:00h: call [phone]  12/21/2018 12:04 PM

## 2018-12-21 NOTE — Progress Notes (Signed)
Informed by RN earlier-that patient has been off sedation since 7 AM this morning-and that she remains unresponsive.  Have ordered a CT scan of the head stat that is currently pending.  Have spoken with patient's son over the phone-they are aware of the potential catastrophic diagnosis of a stroke on top of her other medical conditions outlined above.  Family is also aware that if indeed she has had a stroke or has significant anoxic injury-further work-up will not likely change management.  We will see how she does over the next few days-if she remains unresponsive in spite of not being on any sedation-family aware that we may not have any other options apart from pursuing hospice care.  We will continue to follow closely.

## 2018-12-21 NOTE — Progress Notes (Signed)
PROGRESS NOTE                                                                                                                                                                                                             Patient Demographics:    Jody Taylor, is a 78 y.o. female, DOB - 1940-10-02, YFR:[id number]  Outpatient Primary MD for the patient is Nicoletta Dress, MD    LOS - 7  No chief complaint on file.      Brief Narrative: Patient is a 78 y.o. female with PMHx of HTN, CAD, chronic diastolic heart failure, severe persistent asthma on benralizumab, hypothyroidism, CKD stage III-presented to [facility] with acute respiratory distress-was found to have acute hypoxic respiratory failure secondary to COVID-19 pneumonia and emergently intubated and subsequently transferred to [facility].   Subjective:    Jody Taylor today remains intubated and sedated.  Per nursing staff-no major issues overnight.   Assessment  & Plan :   Acute Hypoxic Resp Failure due to Covid 19 Viral pneumonia and superimposed ?  Bacterial pneumonia: Remained stable overnight-remains intubated-Hospital course has been complicated by ventricular dyssynchrony-was changed to PCV mode overnight which she seems to tolerate better per RT.  Due to concern for bacterial pneumonia-PCCM started patient on Zyvox and cefepime on 7/5.  For COVID and pneumonia-patient has received maximal medical treatment including a course of steroids, Actemra along with convalescent plasma.  Remains on intravenous steroids which will be tapered gradually.   COVID-19 Labs:  Recent Labs    12/19/18 0500 12/20/18 0405 12/20/18 0406 12/21/18 0450  DDIMER 6.58* 5.20*  --  6.80*  FERRITIN 164  --  134 146  CRP 1.9*  --  1.4* <0.8    No results found for: SARSCOV2NAA   COVID-19 Medications: 6/29>> Actemra 6/29>> Remdesivir 6/29>> convalescent  plasma 6/29>> Solu-Medrol  Vent Settings: Vent Mode: PCV FiO2 (%):  [40 %-70 %] 40 % Set Rate:  [28 bmp] 28 bmp Vt Set:  [300 mL] 300 mL PEEP:  [12 cmH20] 12 cmH20 Plateau Pressure:  [18 cmH20-28 cmH20] 26 cmH20  ABG:    Component Value Date/Time   PHART 7.439 12/21/2018 0452   PCO2ART 38.3 12/21/2018 0452   PO2ART 61.0 (L) 12/21/2018 0452   HCO3 25.9 12/21/2018 0452   TCO2 27 12/21/2018 0452  ACIDBASEDEF 5.0 (H) 12/15/2018 1501   O2SAT 92.0 12/21/2018 0452    Steroid-induced hyperglycemia on top of prediabetes: CBGs stable-continue 20 months of Levemir and SSI-follow and adjust accordingly.  A1c 6.4.  HTN: BP stable-continue with as needed hydralazine  Hypernatremia: Improving-continue gentle hydration with half-normal saline, increase free water via tube to 350 cc every 4 hours.  Repeat electrolytes tomorrow.  Elevated d-dimer: Lower extremity Dopplers on 7/6 negative (preliminary results) for DVT-on twice daily dosing of prophylactic Lovenox.  CKD stage III: Creatinine close to usual baseline-follow  CAD: No anginal symptoms evident-continue aspirin  Chronic diastolic heart failure: Appears compensated-continue Lasix-follow renal function, I/O's and daily weights  History of severe persistent asthma: No wheezing-continue bronchodilators-on benralizumab as outpatient.  Dyslipidemia: Continue statin  Hypothyroidism: Continue Synthroid  Dyslipidemia: Continue statin  GERD: Continue PPI  Anxiety/depression: continue Seroquel  Condition - Extremely Guarded  Family Communication  : Daughter updated over the phone.  Code Status : DNR  Diet :  Diet Order    None       Disposition Plan  :  Remain inpatient  Consults  :  PCCM  Procedures  :    ETT>> 6/29 Right IJ [facility] at [facility]>> 6/29  GI prophylaxis: PPI  DVT Prophylaxis  :  Lovenox twice daily dosing-watch d-dimer  Lab Results  Component Value Date   PLT 200 12/21/2018     Inpatient Medications  Scheduled Meds:  sodium chloride   Intravenous Once   artificial tears  1 application Both Eyes H4T   aspirin  81 mg Per Tube Daily   chlorhexidine gluconate (MEDLINE KIT)  15 mL Mouth Rinse BID   Chlorhexidine Gluconate Cloth  6 each Topical Daily   clonazePAM  1 mg Oral BID   dexamethasone (DECADRON) injection  6 mg Intravenous Q12H   enoxaparin (LOVENOX) injection  40 mg Subcutaneous Q12H   fentaNYL  1 patch Transdermal Q72H   free water  300 mL Per Tube Q4H   insulin aspart  0-15 Units Subcutaneous Q4H   insulin detemir  20 Units Subcutaneous Daily   levothyroxine  50 mcg Intravenous Daily   mouth rinse  15 mL Mouth Rinse 10 times per day   pantoprazole sodium  40 mg Per Tube Daily   QUEtiapine  50 mg Oral BID   sodium chloride flush  10-40 mL Intracatheter Q12H   vitamin C  500 mg Per Tube Daily   zinc sulfate  220 mg Per Tube Daily   Continuous Infusions:  sodium chloride 50 mL/hr at 12/21/18 1100   ceFEPime (MAXIPIME) IV 2 g (12/21/18 0937)   feeding supplement (VITAL HIGH PROTEIN) 1,000 mL (12/20/18 1400)   fentaNYL infusion INTRAVENOUS 50 mcg/hr (12/21/18 1100)   linezolid (ZYVOX) IV 600 mg (12/20/18 2240)   midazolam Stopped (12/21/18 0912)   PRN Meds:.acetaminophen, fentaNYL, hydrALAZINE, ipratropium-albuterol, nitroGLYCERIN, [DISCONTINUED] ondansetron **OR** ondansetron (ZOFRAN) IV, polyethylene glycol, vecuronium  Antibiotics  :    Anti-infectives (From admission, onward)   Start     Dose/Rate Route Frequency Ordered Stop   12/20/18 1230  linezolid (ZYVOX) IVPB 600 mg     600 mg 300 mL/hr over 60 Minutes Intravenous Every 12 hours 12/20/18 1210     12/20/18 1230  ceFEPIme (MAXIPIME) 2 g in sodium chloride 0.9 % 100 mL IVPB     2 g 200 mL/hr over 30 Minutes Intravenous Every 12 hours 12/20/18 1229     12/15/18 2000  remdesivir 100 mg in sodium chloride  0.9 % 250 mL IVPB     100 mg 500 mL/hr over 30 Minutes  Intravenous Every 24 hours 12/03/2018 1845 12/18/18 2053   11/16/2018 2000  remdesivir 200 mg in sodium chloride 0.9 % 250 mL IVPB     200 mg 500 mL/hr over 30 Minutes Intravenous Once 11/20/2018 1845 11/25/2018 2116       Time Spent in minutes  45   Oren Binet M.D on 12/21/2018 at 11:51 AM  To page go to [email or web address] - use universal password  [facility] -  Office  [phone]  The patient is critically ill with multiple organ system failure and requires high complexity decision making for assessment and support, frequent evaluation and titration of therapies, advanced monitoring, review of radiographic studies and interpretation of complex data.   See all Orders from today for further details   Admit date - 11/26/2018    7    Objective:   Vitals:   12/21/18 0731 12/21/18 0800 12/21/18 0900 12/21/18 1000  BP: 140/81 135/73 136/72 (!) 126/59  Pulse: 69 82 61 81  Resp: (!) 28 (!) 28 (!) 28 (!) 28  Temp: 99.3 F (37.4 C) 99.3 F (37.4 C) 99.5 F (37.5 C) 99.7 F (37.6 C)  TempSrc:      SpO2: 98% (!) 86% 91% 90%  Weight:      Height:        Wt Readings from Last 3 Encounters:  12/21/18 96.9 kg  03/02/18 95.5 kg  01/13/18 95.7 kg     Intake/Output Summary (Last 24 hours) at 12/21/2018 1151 Last data filed at 12/21/2018 1100 Gross per 24 hour  Intake 5417.45 ml  Output 2045 ml  Net 3372.45 ml     Physical Exam General appearance: Intubated/sedated Eyes:no scleral icterus. HEENT: Atraumatic and Normocephalic Neck: supple, no JVD. Resp:Good air entry bilaterally,no rales or rhonchi heard anteriorly CVS: S1 S2 regular, no murmurs.  GI: Bowel sounds present, Non tender and not distended with no gaurding, rigidity or rebound. Extremities: B/L Lower Ext shows no edema, both legs are warm to touch Neurology: Unable to evaluate-as patient is intubated and sedated.     Data Review:    CBC Recent Labs  Lab 12/15/18 0505  12/16/18 0500 12/17/18 0455  12/18/18 0500  12/19/18 0500 12/20/18 0405 12/21/18 0007 12/21/18 0450 12/21/18 0452  WBC 3.4*  --  7.2 6.4 6.3  --  7.8 6.7  --  7.2  --   HGB 11.4*   < > 11.0* 11.2* 12.8   < > 11.6* 11.8* 10.5* 11.1* 10.5*  HCT 35.5*   < > 34.8* 34.3* 39.5   < > 38.3 37.9 31.0* 35.8* 31.0*  PLT 180  --  205 220 238  --  230 226  --  200  --   MCV 94.9  --  95.6 95.3 94.3  --  98.5 100.0  --  99.7  --   MCH 30.5  --  30.2 31.1 30.5  --  29.8 31.1  --  30.9  --   MCHC 32.1  --  31.6 32.7 32.4  --  30.3 31.1  --  31.0  --   RDW 14.7  --  14.6 14.4 14.2  --  14.4 14.6  --  14.5  --   LYMPHSABS 0.4*  --  0.4* 0.5* 0.5*  --  0.4*  --   --   --   --   MONOABS 0.1  --  0.5 0.5 0.5  --  0.5  --   --   --   --   EOSABS 0.0  --  0.0 0.0 0.0  --  0.0  --   --   --   --   BASOSABS 0.0  --  0.0 0.0 0.0  --  0.0  --   --   --   --    < > = values in this interval not displayed.    Chemistries  Recent Labs  Lab 12/16/18 0500 12/17/18 0455 12/18/18 0500  12/19/18 0500 12/20/18 0405 12/21/18 0007 12/21/18 0450 12/21/18 0452  NA 141 142 143   < > 146* 151* 146* 148* 146*  K 4.2 3.3* 3.6   < > 4.1 3.6 4.1 3.9 3.9  CL 110 106 105  --  111 112*  --  112*  --   CO2 21* 27 28  --  24 29  --  26  --   GLUCOSE 212* 194* 234*  --  210* 144*  --  240*  --   BUN 53* 59* 67*  --  84* 99*  --  79*  --   CREATININE 1.61* 1.44* 1.24*  --  1.44* 1.52*  --  1.31*  --   CALCIUM 7.9* 7.6* 7.7*  --  7.6* 8.0*  --  8.2*  --   MG 2.1  --  1.9  --  2.2 2.3  --  2.2  --   AST 33 32 32  --  29 22  --  26  --   ALT 30 30 36  --  35 30  --  23  --   ALKPHOS 68 72 102  --  92 76  --  82  --   BILITOT 0.3 0.2* 0.4  --  0.2* 0.1*  --  0.2*  --    < > = values in this interval not displayed.   ------------------------------------------------------------------------------------------------------------------ Recent Labs    12/19/18 0500  TRIG 404*    Lab Results  Component Value Date   HGBA1C 6.4 (H) 12/20/2018    ------------------------------------------------------------------------------------------------------------------ No results for input(s): TSH, T4TOTAL, T3FREE, THYROIDAB in the last 72 hours.  Invalid input(s): FREET3 ------------------------------------------------------------------------------------------------------------------ Recent Labs    12/20/18 0406 12/21/18 0450  FERRITIN 134 146    Coagulation profile No results for input(s): INR, PROTIME in the last 168 hours.  Recent Labs    12/20/18 0405 12/21/18 0450  DDIMER 5.20* 6.80*    Cardiac Enzymes No results for input(s): CKMB, TROPONINI, MYOGLOBIN in the last 168 hours.  Invalid input(s): CK ------------------------------------------------------------------------------------------------------------------    Component Value Date/Time   BNP 240.7 (H) 12/16/2018 1300    Micro Results Recent Results (from the past 240 hour(s))  MRSA PCR Screening     Status: None   Collection Time: 11/17/2018  5:57 PM   Specimen: Nasal Mucosa; Nasopharyngeal  Result Value Ref Range Status   MRSA by PCR NEGATIVE NEGATIVE Final    Comment:        The GeneXpert MRSA Assay (FDA approved for NASAL specimens only), is one component of a comprehensive MRSA colonization surveillance program. It is not intended to diagnose MRSA infection nor to guide or monitor treatment for MRSA infections. Performed at [facility], [place] [address], [place], [place] [address]   Culture, respiratory (non-expectorated)     Status: None (Preliminary result)   Collection Time: 12/20/18 12:03 PM   Specimen: Tracheal Aspirate; Respiratory  Result Value Ref Range Status  Specimen Description   Final    TRACHEAL ASPIRATE Performed at [facility] [address], [place], [place] [address]    Special Requests   Final    Immunocompromised Performed at [facility], [place] [address],  [place], [place] [address]    Gram Stain   Final    FEW WBC PRESENT, PREDOMINANTLY PMN RARE SQUAMOUS EPITHELIAL CELLS PRESENT MODERATE GRAM POSITIVE COCCI IN CLUSTERS RARE GRAM POSITIVE RODS RARE BUDDING YEAST SEEN    Culture   Final    CULTURE REINCUBATED FOR BETTER GROWTH Performed at [facility], [place] [address] [place], [place] [address]    Report Status PENDING  Incomplete    Radiology Reports Dg Abd 1 View  Result Date: 12/02/2018 CLINICAL DATA:  Orogastric tube placement. EXAM: ABDOMEN - 1 VIEW COMPARISON:  None FINDINGS: Orogastric tube tip is in the distal body of the stomach. No dilated bowel.  Previous lumbar fusion at L5-S1. IMPRESSION: OG tube tip is in the distal stomach. Electronically Signed   By: Lorriane Shire M.D.   On: 12/13/2018 20:47   Dg Chest Port 1 View  Result Date: 12/18/2018 CLINICAL DATA:  Shock. EXAM: PORTABLE CHEST 1 VIEW COMPARISON:  Radiograph of same day. FINDINGS: Stable cardiomediastinal silhouette. Endotracheal and feeding tubes are in good position. Right-sided PICC line is unchanged in position. No pneumothorax or pleural effusion is noted. Stable diffuse bilateral lung opacities are noted consistent with pneumonia. Bony thorax unremarkable. IMPRESSION: Stable support apparatus. Stable bilateral lung opacities consistent with pneumonia. Aortic Atherosclerosis (ICD10-I70.0). Electronically Signed   By: Marijo Conception M.D.   On: 12/18/2018 17:20   Dg Chest Port 1 View  Result Date: 12/18/2018 CLINICAL DATA:  Shortness of breath, pneumonia, COVID-19 virus EXAM: PORTABLE CHEST 1 VIEW COMPARISON:  12/15/2018 FINDINGS: Endotracheal tube 3.2 cm above the carina. Exam is rotated to the left. Right upper extremity PICC line tip mid SVC level. Stable cardiomegaly and diffuse mixed interstitial and airspace opacities throughout both lungs. Low lung volumes persist. No significant change in aeration. No developing large effusion or pneumothorax. Aorta  atherosclerotic. IMPRESSION: Stable cardiomegaly and diffuse mixed interstitial and airspace process compatible with pneumonia/viral pneumonia. Electronically Signed   By: Jerilynn Mages.  Shick M.D.   On: 12/18/2018 11:12   Dg Chest Port 1 View  Result Date: 12/15/2018 CLINICAL DATA:  Pulmonary infiltrates. Endotracheally intubated. COVID-19. EXAM: PORTABLE CHEST 1 VIEW 10:25 a.m. COMPARISON:  12/15/2018 at 5:09 a.m. and 12/09/2018 FINDINGS: Endotracheal tube is in good position 4 cm above the carina. NG tube tip is below the diaphragm. Central line tip is in the superior vena cava just below the carina, unchanged. Extensive bilateral pulmonary infiltrates persist, slightly increased at the left base. No effusions.  No acute bone abnormality. IMPRESSION: Slight progression of pulmonary infiltrates at the left base. No other change. Electronically Signed   By: Lorriane Shire M.D.   On: 12/15/2018 11:15   Portable Chest 1 View  Result Date: 12/15/2018 CLINICAL DATA:  Shortness of breath.  COVID-19. EXAM: PORTABLE CHEST 1 VIEW COMPARISON:  12/04/2018. FINDINGS: Patient is rotated to the right. Endotracheal tube tip noted just above the right mainstem bronchus. Retraction of approximately 2 cm suggested. NG tube noted with tip below left hemidiaphragm. Right IJ line stable position. Heart size stable. Diffuse severe bilat interstitial infiltrates are again noted. Persistent low lung volumes. No pleural effusion or pneumothorax. IMPRESSION: 1. Endotracheal tube tip is noted just above the right mainstem  bronchus. Retraction of approximately 2 cm suggested. NG tube and right IJ line stable position. 2. Diffuse severe bilateral pulmonary interstitial infiltrates again noted. Persistent low lung volumes. Critical Value/emergent results were called by telephone at the time of interpretation on 12/15/2018 at 7:01 am to nurse Elmyra Ricks, who verbally acknowledged these results. Electronically Signed   By: Marcello Moores  Register   On:  12/15/2018 07:02   Dg Chest Port 1 View  Result Date: 11/17/2018 CLINICAL DATA:  Acute respiratory failure with hypoxia. EXAM: PORTABLE CHEST 1 VIEW COMPARISON:  December 14, 2018 FINDINGS: The endotracheal tube terminates above the carina by approximately 2.1 cm. The right-sided central venous catheter is well position. The enteric tube extends below the left hemidiaphragm. The heart size is enlarged. Aortic calcifications are noted. Again seen are diffuse bilateral hazy airspace opacities with some significant improvement from prior study. This may be in part due to approved imaging technique. There is no definite pneumothorax. There are likely small bilateral pleural effusions. IMPRESSION: 1. Lines and tubes as above. 2. Persistent but slightly improved multifocal airspace opacities which can be seen in patients with pulmonary edema or an atypical infectious process. Electronically Signed   By: Constance Holster M.D.   On: 12/03/2018 19:07   Dg Chest Port 1v Same Day  Result Date: 12/20/2018 CLINICAL DATA:  Dyspnea, COVID-19 positive EXAM: PORTABLE CHEST 1 VIEW COMPARISON:  12/18/2018 chest radiograph. FINDINGS: Right PICC terminates in upper third of the SVC. Endotracheal tube tip is 4.1 cm above the carina. Enteric tube enters stomach with the tip not seen on this image. Stable cardiomediastinal silhouette with normal heart size. No pneumothorax. No pleural effusion. Patchy hazy opacities throughout both lungs without appreciable change. IMPRESSION: 1. Well-positioned support structures. 2. Patchy hazy opacities throughout both lungs without appreciable change, compatible with multilobar pneumonia. Electronically Signed   By: Ilona Sorrel M.D.   On: 12/20/2018 10:15   Vas [place] Lower Extremity Venous (dvt)  Result Date: 12/21/2018  Lower Venous Study Indications: Swelling, and hypoxic-intubated and elevated d-dimer. and positive for Covid-19.  Comparison Study: No prior. Performing Technologist: Oda Cogan RDMS, RVT  Examination Guidelines: A complete evaluation includes B-mode imaging, spectral Doppler, color Doppler, and power Doppler as needed of all accessible portions of each vessel. Bilateral testing is considered an integral part of a complete examination. Limited examinations for reoccurring indications may be performed as noted.  +---------+---------------+---------+-----------+----------+-------+  RIGHT     Compressibility Phasicity Spontaneity Properties Summary  +---------+---------------+---------+-----------+----------+-------+  CFV       Full            Yes       Yes                             +---------+---------------+---------+-----------+----------+-------+  SFJ       Full                                                      +---------+---------------+---------+-----------+----------+-------+  FV Prox   Full                                                      +---------+---------------+---------+-----------+----------+-------+  FV Mid    Full                                                      +---------+---------------+---------+-----------+----------+-------+  FV Distal Full                                                      +---------+---------------+---------+-----------+----------+-------+  PFV       Full                                                      +---------+---------------+---------+-----------+----------+-------+  POP       Full            Yes       Yes                             +---------+---------------+---------+-----------+----------+-------+  PTV       Full                                                      +---------+---------------+---------+-----------+----------+-------+  PERO      Full                                                      +---------+---------------+---------+-----------+----------+-------+   +---------+---------------+---------+-----------+----------+-------+  LEFT      Compressibility Phasicity Spontaneity Properties Summary   +---------+---------------+---------+-----------+----------+-------+  CFV       Full            Yes       Yes                             +---------+---------------+---------+-----------+----------+-------+  SFJ       Full                                                      +---------+---------------+---------+-----------+----------+-------+  FV Prox   Full                                                      +---------+---------------+---------+-----------+----------+-------+  FV Mid    Full                                                      +---------+---------------+---------+-----------+----------+-------+  FV Distal Full                                                      +---------+---------------+---------+-----------+----------+-------+  PFV       Full                                                      +---------+---------------+---------+-----------+----------+-------+  POP       Full            Yes       Yes                             +---------+---------------+---------+-----------+----------+-------+  PTV       Full                                                      +---------+---------------+---------+-----------+----------+-------+  PERO      Full                                                      +---------+---------------+---------+-----------+----------+-------+     Summary: Right: There is no evidence of deep vein thrombosis in the lower extremity. Left: There is no evidence of deep vein thrombosis in the lower extremity.  *See table(s) above for measurements and observations.    Preliminary    [place] Ekg Site Rite  Result Date: 11/29/2018 If Site Rite image not attached, placement could not be confirmed due to current cardiac rhythm.

## 2018-12-21 NOTE — Progress Notes (Signed)
Re-eval at bedside  RN reports unresponsivleness with WUA off sedatiion gtt- change from yesterday  O RASS 04 Pupils not dilated  Plan Stat CT chead      SIGNATURE    Dr. Brand Males, M.D., F.C.C.P,  Pulmonary and Critical Care Medicine Staff Physician, [place] Director - Interstitial Lung Disease  Program  Pulmonary [place] [facility] [place], [place], [address]  Pager: [phone], If no answer or between  15:00h - 7:00h: call [phone] Telephone: [phone]  2:02 PM 12/21/2018

## 2018-12-21 NOTE — Progress Notes (Signed)
Family called and updated vie Facetime.  Going to CT

## 2018-12-21 NOTE — Progress Notes (Signed)
CT hed - nil acute Duplex LE - negt DVT     SIGNATURE    Dr. Brand Males, M.D., F.C.C.P,  Pulmonary and Critical Care Medicine Staff Physician, [place] Director - Interstitial Lung Disease  Program  Pulmonary [place] [facility] [place], [place], [address]  Pager: [phone], If no answer or between  15:00h - 7:00h: call [phone] Telephone: [phone]  5:53 PM 12/21/2018   Ct Head Wo Contrast  Result Date: 12/21/2018 CLINICAL DATA:  Unresponsive, altered level of consciousness unexplained, history of coronary artery disease post MI, asthma, CHF, hypertension, former smoker EXAM: CT HEAD WITHOUT CONTRAST TECHNIQUE: Contiguous axial images were obtained from the base of the skull through the vertex without intravenous contrast. Sagittal and coronal MPR images reconstructed from axial data set. COMPARISON:  None FINDINGS: Brain: Generalized atrophy. Cavum septum pellucidum and vergae. No midline shift or mass effect. Small vessel chronic ischemic changes of deep cerebral white matter. 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 Streak artifacts from skull base. No definite intracranial hemorrhage, mass lesion or evidence of acute infarction. No extra-axial fluid collections. Vascular: Atherosclerotic calcifications of internal carotid arteries at skull base Skull: Intact Sinuses/Orbits: Small amount of fluid dependently in RIGHT sphenoid sinus. Remaining sinuses clear. Orbits clear. Other: N/A IMPRESSION: Atrophy with small vessel chronic ischemic changes of deep cerebral white matter. 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 No acute intracranial abnormalities. Electronically Signed   By: Lavonia Dana M.D.   On: 12/21/2018 16:49   Vas [place] Lower Extremity Venous (dvt)  Result Date: 12/21/2018  Lower Venous Study Indications: Swelling, and hypoxic-intubated and elevated d-dimer. and positive for Covid-19.  Comparison Study: No prior.  Performing Technologist: Oda Cogan RDMS, RVT  Examination Guidelines: A complete evaluation includes B-mode imaging, spectral Doppler, color Doppler, and power Doppler as needed of all accessible portions of each vessel. Bilateral testing is considered an integral part of a complete examination. Limited examinations for reoccurring indications may be performed as noted.  +---------+---------------+---------+-----------+----------+-------+ RIGHT    CompressibilityPhasicitySpontaneityPropertiesSummary +---------+---------------+---------+-----------+----------+-------+ CFV      Full           Yes      Yes                          +---------+---------------+---------+-----------+----------+-------+ SFJ      Full                                                 +---------+---------------+---------+-----------+----------+-------+ FV Prox  Full                                                 +---------+---------------+---------+-----------+----------+-------+ FV Mid   Full                                                 +---------+---------------+---------+-----------+----------+-------+ FV DistalFull                                                 +---------+---------------+---------+-----------+----------+-------+  PFV      Full                                                 +---------+---------------+---------+-----------+----------+-------+ POP      Full           Yes      Yes                          +---------+---------------+---------+-----------+----------+-------+ PTV      Full                                                 +---------+---------------+---------+-----------+----------+-------+ PERO     Full                                                 +---------+---------------+---------+-----------+----------+-------+   +---------+---------------+---------+-----------+----------+-------+ LEFT      CompressibilityPhasicitySpontaneityPropertiesSummary +---------+---------------+---------+-----------+----------+-------+ CFV      Full           Yes      Yes                          +---------+---------------+---------+-----------+----------+-------+ SFJ      Full                                                 +---------+---------------+---------+-----------+----------+-------+ FV Prox  Full                                                 +---------+---------------+---------+-----------+----------+-------+ FV Mid   Full                                                 +---------+---------------+---------+-----------+----------+-------+ FV DistalFull                                                 +---------+---------------+---------+-----------+----------+-------+ PFV      Full                                                 +---------+---------------+---------+-----------+----------+-------+ POP      Full           Yes      Yes                          +---------+---------------+---------+-----------+----------+-------+ PTV  Full                                                 +---------+---------------+---------+-----------+----------+-------+ PERO     Full                                                 +---------+---------------+---------+-----------+----------+-------+     Summary: Right: There is no evidence of deep vein thrombosis in the lower extremity. Left: There is no evidence of deep vein thrombosis in the lower extremity.  *See table(s) above for measurements and observations.    Preliminary

## 2018-12-21 NOTE — Progress Notes (Signed)
Pt transported to separate room for staffing concerns.  Pt transported on 100% O2 and suctioned before transport.  No complications during transport. RT will continue to monitor.

## 2018-12-21 NOTE — Progress Notes (Signed)
Venous lower ext  has been completed. Refer to [facility] under chart review to view preliminary results.   12/21/2018  11:38 AM Jozy Mcphearson, Bonnye Fava

## 2018-12-21 NOTE — Progress Notes (Deleted)
Cardiology Office Note:    Date:  12/21/2018   ID:  Jody Taylor, DOB 1940-11-08, MRN [id number]  PCP:  Nicoletta Dress, MD  Cardiologist:  Shirlee More, MD    Referring MD: Nicoletta Dress, MD    ASSESSMENT:    No diagnosis found. PLAN:    In order of problems listed above:  1. ***   Next appointment: ***   Medication Adjustments/Labs and Tests Ordered: Current medicines are reviewed at length with the patient today.  Concerns regarding medicines are outlined above.  No orders of the defined types were placed in this encounter.  No orders of the defined types were placed in this encounter.   No chief complaint on file.   History of Present Illness:    Jody Taylor is a 78 y.o. female with a hx of CAD, diastolic CHF, Dyslipidemia, HTN and  severe persistent bronchial asthma on Benralizumab      last seen 01/13/18. Compliance with diet, lifestyle and medications: *** Past Medical History:  Diagnosis Date  . 2-vessel coronary artery disease   . Adult hypothyroidism   . Allergic rhinitis 09/12/2016  . Anemia of chronic disease   . Arthritis   . Asthma   . CAD in native artery 05/02/2015   Overview:   S/P PCI and stent x 2. Last cardiac cath August 2010 with mild nonobstructive CAD and normal LV function PCI and stent of proximal [facility] 1999 Cath Nov 2016:Angiographic findings Cardiac Arteries and Lesion Findings LMCA: Normal. LAD: Normal. LCx: Normal. RCA: Abnormal. Lesion on R PDA: Ostial.35% stenosis 5 mm length . Pre procedure TIMI III flow was noted. Good run off was present.Bifurcation lesion.  Cath 05/08/15:Mild non-obstructive coronary artery disease. Normal LV function  . Cancer ([place]) 03/31/2017  . Chronic diastolic CHF (congestive heart failure) ([place])   . Chronic GERD   . Chronic kidney disease, stage 3 (moderate) ([place])    patient denies (listed in 07/20/16 PCP notes-Dr. Nelda Bucks)  . CKD (chronic kidney disease) 06/05/2015  . Colon polyp   .  Cough 09/12/2016  . Depression    since hysterectomy   . Dyspnea 09/12/2016  . Hallucinations 08/17/2013  . Heart attack ([place])   . Hyperlipidemia LDL goal <70   . Hypertension, essential, benign   . Obstructive sleep apnea 09/12/2016  . Polio   . PONV (postoperative nausea and vomiting)   . PONV (postoperative nausea and vomiting)   . 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 Sleep apnea    wears CPAP  . Spondylolisthesis of lumbosacral region 11/07/2016  . [facility]    "i had a mini stroke I didn't even know I had it" found on MRI  . Vaginal atrophy 01/30/2016  . Vitamin D deficiency   . Wheezing 09/12/2016    Past Surgical History:  Procedure Laterality Date  . APPENDECTOMY    . BACK SURGERY  11/07/2016   Lumbar region/ 4 screws and 2 rods  . CAROTID STENT    . CHOLECYSTECTOMY    . CORONARY ANGIOPLASTY WITH STENT PLACEMENT    . CORONARY BALLOON ANGIOPLASTY    . TOTAL ABDOMINAL HYSTERECTOMY      Current Medications: Current Facility-Administered Medications for the 12/22/18 encounter (Appointment) with Richardo Priest, MD  Medication  . Benralizumab SOSY 30 mg   No outpatient medications have been marked as taking for the 12/22/18 encounter (Appointment) with Richardo Priest, MD.     Allergies:   Ace inhibitors, Other, Codeine,  and Levaquin [levofloxacin]   Social History   Socioeconomic History  . Marital status: Married    Spouse name: Not on file  . Number of children: Not on file  . Years of education: Not on file  . Highest education level: Not on file  Occupational History  . Not on file  Social Needs  . Financial resource strain: Not on file  . Food insecurity    Worry: Not on file    Inability: Not on file  . Transportation needs    Medical: Not on file    Non-medical: Not on file  Tobacco Use  . Smoking status: Former Smoker    Quit date: 02/14/1965    Years since quitting: 53.8  . Smokeless tobacco: Never Used  . Tobacco comment: for about one  month  Substance and Sexual Activity  . Alcohol use: No  . Drug use: No  . Sexual activity: Not on file  Lifestyle  . Physical activity    Days per week: Not on file    Minutes per session: Not on file  . Stress: Not on file  Relationships  . Social Herbalist on phone: Not on file    Gets together: Not on file    Attends religious service: Not on file    Active member of club or organization: Not on file    Attends meetings of clubs or organizations: Not on file    Relationship status: Not on file  Other Topics Concern  . Not on file  Social History Narrative  . Not on file     Family History: The patient's ***family history includes Asthma in her mother; Colon cancer in her brother; Diabetes in her mother; Heart disease in her father; Stroke in her father and mother. ROS:   Please see the history of present illness.    All other systems reviewed and are negative.  EKGs/Labs/Other Studies Reviewed:    The following studies were reviewed today:  EKG:  EKG ordered today and personally reviewed.  The ekg ordered today demonstrates ***  Recent Labs: 12/16/2018: B Natriuretic Peptide 240.7 12/21/2018: ALT 23; BUN 79; Creatinine, Ser 1.31; Hemoglobin 10.5; Magnesium 2.2; Platelets 200; Potassium 3.9; Sodium 146  Recent Lipid Panel    Component Value Date/Time   TRIG 404 (H) 12/19/2018 0500    Physical Exam:    VS:  LMP  (LMP Unknown)     Wt Readings from Last 3 Encounters:  12/21/18 213 lb 10 oz (96.9 kg)  03/02/18 210 lb 9.6 oz (95.5 kg)  01/13/18 211 lb (95.7 kg)     GEN: *** Well nourished, well developed in no acute distress HEENT: Normal NECK: No JVD; No carotid bruits LYMPHATICS: No lymphadenopathy CARDIAC: ***RRR, no murmurs, rubs, gallops RESPIRATORY:  Clear to auscultation without rales, wheezing or rhonchi  ABDOMEN: Soft, non-tender, non-distended MUSCULOSKELETAL:  No edema; No deformity  SKIN: Warm and dry NEUROLOGIC:  Alert and oriented x 3  PSYCHIATRIC:  Normal affect    Signed, Shirlee More, MD  12/21/2018 7:49 AM    [place]

## 2018-12-22 ENCOUNTER — Inpatient Hospital Stay (HOSPITAL_COMMUNITY): Payer: Medicare HMO

## 2018-12-22 ENCOUNTER — Ambulatory Visit: Payer: Medicare Other | Admitting: Cardiology

## 2018-12-22 DIAGNOSIS — I361 Nonrheumatic tricuspid (valve) insufficiency: Secondary | ICD-10-CM

## 2018-12-22 LAB — COMPREHENSIVE METABOLIC PANEL
ALT: 19 U/L (ref 0–44)
AST: 27 U/L (ref 15–41)
Albumin: 2 g/dL — ABNORMAL LOW (ref 3.5–5.0)
Alkaline Phosphatase: 78 U/L (ref 38–126)
Anion gap: 7 (ref 5–15)
BUN: 66 mg/dL — ABNORMAL HIGH (ref 8–23)
CO2: 26 mmol/L (ref 22–32)
Calcium: 7.9 mg/dL — ABNORMAL LOW (ref 8.9–10.3)
Chloride: 111 mmol/L (ref 98–111)
Creatinine, Ser: 1.07 mg/dL — ABNORMAL HIGH (ref 0.44–1.00)
GFR calc Af Amer: 58 mL/min — ABNORMAL LOW (ref >60)
GFR calc non Af Amer: 50 mL/min — ABNORMAL LOW (ref >60)
Glucose, Bld: 156 mg/dL — ABNORMAL HIGH (ref 70–99)
Potassium: 3.6 mmol/L (ref 3.5–5.1)
Sodium: 144 mmol/L (ref 135–145)
Total Bilirubin: 0.3 mg/dL (ref 0.3–1.2)
Total Protein: 4.3 g/dL — ABNORMAL LOW (ref 6.5–8.1)

## 2018-12-22 LAB — URINE CULTURE: Culture: >=100000 — AB

## 2018-12-22 LAB — GLUCOSE, CAPILLARY
Glucose-Capillary: 135 mg/dL — ABNORMAL HIGH (ref 70–99)
Glucose-Capillary: 140 mg/dL — ABNORMAL HIGH (ref 70–99)
Glucose-Capillary: 142 mg/dL — ABNORMAL HIGH (ref 70–99)
Glucose-Capillary: 190 mg/dL — ABNORMAL HIGH (ref 70–99)
Glucose-Capillary: 226 mg/dL — ABNORMAL HIGH (ref 70–99)
Glucose-Capillary: 247 mg/dL — ABNORMAL HIGH (ref 70–99)

## 2018-12-22 LAB — C-REACTIVE PROTEIN: CRP: <0.8 mg/dL (ref <1.0)

## 2018-12-22 LAB — CBC
HCT: 37.7 % (ref 36.0–46.0)
Hemoglobin: 11.8 g/dL — ABNORMAL LOW (ref 12.0–15.0)
MCH: 31.1 pg (ref 26.0–34.0)
MCHC: 31.3 g/dL (ref 30.0–36.0)
MCV: 99.5 fL (ref 80.0–100.0)
Platelets: 198 10*3/uL (ref 150–400)
RBC: 3.79 MIL/uL — ABNORMAL LOW (ref 3.87–5.11)
RDW: 14.5 % (ref 11.5–15.5)
WBC: 9.1 10*3/uL (ref 4.0–10.5)
nRBC: 0.2 % (ref 0.0–0.2)

## 2018-12-22 LAB — MAGNESIUM: Magnesium: 2.3 mg/dL (ref 1.7–2.4)

## 2018-12-22 LAB — D-DIMER, QUANTITATIVE: D-Dimer, Quant: 6.4 ug/mL-FEU — ABNORMAL HIGH (ref 0.00–0.50)

## 2018-12-22 LAB — ECHOCARDIOGRAM LIMITED
Height: 58 in
Weight: 3470.92 oz

## 2018-12-22 LAB — PROCALCITONIN: Procalcitonin: <0.1 ng/mL

## 2018-12-22 LAB — FERRITIN: Ferritin: 163 ng/mL (ref 11–307)

## 2018-12-22 LAB — HEPARIN LEVEL (UNFRACTIONATED): Heparin Unfractionated: 1.46 IU/mL — ABNORMAL HIGH (ref 0.30–0.70)

## 2018-12-22 MED ORDER — HEPARIN (PORCINE) 25000 UT/250ML-% IV SOLN: 700.0000 [IU]/h | INTRAVENOUS | Status: DC

## 2018-12-22 MED ORDER — DEXMEDETOMIDINE HCL IN NACL 400 MCG/100ML IV SOLN: 0.4000 ug/kg/h | INTRAVENOUS | Status: DC

## 2018-12-22 MED ORDER — CHLORHEXIDINE GLUCONATE 0.12 % MT SOLN
15.0000 mL | Freq: Two times a day (BID) | OROMUCOSAL | Status: DC
  Administered 2018-12-22 – 2018-12-31 (×19): 15 mL via OROMUCOSAL
  Filled 2018-12-22 (×2): qty 15

## 2018-12-22 MED ORDER — HEPARIN (PORCINE) 25000 UT/250ML-% IV SOLN
700.0000 [IU]/h | INTRAVENOUS | Status: DC
  Administered 2018-12-22: 12:00:00 850 [IU]/h via INTRAVENOUS
  Filled 2018-12-22: qty 250

## 2018-12-22 MED ORDER — FENTANYL CITRATE (PF) 100 MCG/2ML IJ SOLN
25.0000 ug | INTRAMUSCULAR | Status: DC | PRN
  Administered 2018-12-22: 25 ug via INTRAVENOUS
  Administered 2018-12-23 (×2): 100 ug via INTRAVENOUS
  Filled 2018-12-22 (×3): qty 2

## 2018-12-22 NOTE — Progress Notes (Signed)
  Echocardiogram 2D Echocardiogram limited has been performed.  Jody Taylor M 12/22/2018, 1:35 PM

## 2018-12-22 NOTE — Progress Notes (Signed)
ANTICOAGULATION CONSULT NOTE - Initial Consult  Pharmacy Consult for heparin Indication: atrial fibrillation  Allergies  Allergen Reactions  . Ace Inhibitors Other (See Comments) and Cough    CHEST PAIN  . Other     Patient reports receiving blood after a miscarriage "years ago". She states she broke out from receiving this blood. Has not received any since  . Codeine Nausea And Vomiting  . Levaquin [Levofloxacin] Nausea And Vomiting    Patient Measurements: Height: 4\' 10"  (147.3 cm) Weight: 216 lb 14.9 oz (98.4 kg) IBW/kg (Calculated) : 40.9 Heparin Dosing Weight: 64 kg   Vital Signs: Temp: 98.1 F (36.7 C) (07/07 0800) Temp Source: Axillary (07/07 0800) BP: 103/71 (07/07 0829) Pulse Rate: 74 (07/07 0829)  Labs: Recent Labs    12/20/18 0405  12/21/18 0450 12/21/18 0452 12/22/18 0510  HGB 11.8*   < > 11.1* 10.5* 11.8*  HCT 37.9   < > 35.8* 31.0* 37.7  PLT 226  --  200  --  198  CREATININE 1.52*  --  1.31*  --  1.07*   < > = values in this interval not displayed.    Estimated Creatinine Clearance: 45.1 mL/min (A) (by C-G formula based on SCr of 1.07 mg/dL (H)).   Medical History: Past Medical History:  Diagnosis Date  . 2-vessel coronary artery disease   . Adult hypothyroidism   . Allergic rhinitis 09/12/2016  . Anemia of chronic disease   . Arthritis   . Asthma   . CAD in native artery 05/02/2015   Overview:   S/P PCI and stent x 2. Last cardiac cath August 2010 with mild nonobstructive CAD and normal LV function PCI and stent of proximal [facility] 1999 Cath Nov 2016:Angiographic findings Cardiac Arteries and Lesion Findings LMCA: Normal. LAD: Normal. LCx: Normal. RCA: Abnormal. Lesion on R PDA: Ostial.35% stenosis 5 mm length . Pre procedure TIMI III flow was noted. Good run off was present.Bifurcation lesion.  Cath 05/08/15:Mild non-obstructive coronary artery disease. Normal LV function  . Cancer ([place]) 03/31/2017  . Chronic diastolic CHF (congestive heart failure)  ([place])   . Chronic GERD   . Chronic kidney disease, stage 3 (moderate) ([place])    patient denies (listed in 07/20/16 PCP notes-Dr. Nelda Bucks)  . CKD (chronic kidney disease) 06/05/2015  . Colon polyp   . Cough 09/12/2016  . Depression    since hysterectomy   . Dyspnea 09/12/2016  . Hallucinations 08/17/2013  . Heart attack ([place])   . Hyperlipidemia LDL goal <70   . Hypertension, essential, benign   . Obstructive sleep apnea 09/12/2016  . Polio   . PONV (postoperative nausea and vomiting)   . PONV (postoperative nausea and vomiting)   . 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 Sleep apnea    wears CPAP  . Spondylolisthesis of lumbosacral region 11/07/2016  . Stroke [facility])    "i had a mini stroke I didn't even know I had it" found on MRI  . Vaginal atrophy 01/30/2016  . Vitamin D deficiency   . Wheezing 09/12/2016    Medications:  Facility-Administered Medications Prior to Admission  Medication Dose Route Frequency Provider Last Rate Last Dose  . Benralizumab SOSY 30 mg  30 mg Subcutaneous Q8 Weeks Kozlow, Donnamarie Poag, MD   30 mg at 10/22/18 1120   Medications Prior to Admission  Medication Sig Dispense Refill Last Dose  . cefUROXime (CEFTIN) 250 MG tablet Take 250 mg by mouth 2 (two) times a day. Take 250mg  by  mouth twice daily for seven (7) days     . levothyroxine (SYNTHROID) 100 MCG tablet    unknown  . methylPREDNISolone (MEDROL DOSEPAK) 4 MG TBPK tablet Take 1-6 tablets by mouth See admin instructions. Take 6 tabs by mouth the first day, then decrease dosage by 1 tablet each day until complete.   unknown  . montelukast (SINGULAIR) 10 MG tablet TAKE ONE TABLET BY MOUTH AT BEDTIME (Patient taking differently: Take 10 mg by mouth at bedtime. ) 30 tablet 0 unknown  . nitroGLYCERIN (NITROSTAT) 0.4 MG SL tablet      . promethazine (PHENERGAN) 25 MG tablet Take 25 mg by mouth 4 (four) times daily as needed for nausea or vomiting.    unknown  . TRELEGY ELLIPTA 100-62.5-25 MCG/INH AEPB  Inhale 1 Dose into the lungs daily. Rinse, gargle, and spit after use. 75 each 5 unknown    Assessment: 39 YOF diagnosed with COVID. She developed Afib during her ICU stay with CHADSVASc score of 7 (Age, female gender, HTN, stroke, CHF). Pharmacy consulted to start IV heparin. She is currently on Lovenox for VTE prophylaxis.  H/H low stable, Plt wnl   Goal of Therapy:  Heparin level 0.3-0.5 units/ml Monitor platelets by anticoagulation protocol: Yes   Plan:  -Stop Lovenox  -Start IV heparin at 850 units/hr. No bolus given old lacunar infarcts on CT -F/u 8 hr HL -Monitor daily HL, CBC and s/s of bleeding   Albertina Parr, PharmD., BCPS Clinical Pharmacist Clinical phone for 12/22/18 until 5pm: [phone]

## 2018-12-22 NOTE — Progress Notes (Signed)
PROGRESS NOTE                                                                                                                                                                                                             Patient Demographics:    Jody Taylor, is a 78 y.o. female, DOB - 1940/08/24, VVO:[id number]  Outpatient Primary MD for the patient is Nicoletta Dress, MD    LOS - 8  No chief complaint on file.      Brief Narrative: Patient is a 78 y.o. female with PMHx of HTN, CAD, chronic diastolic heart failure, severe persistent asthma on benralizumab, hypothyroidism, CKD stage III-presented to [facility] with acute respiratory distress-was found to have acute hypoxic respiratory failure secondary to COVID-19 pneumonia and emergently intubated and subsequently transferred to [facility] course complicated by ventricular dyssynchrony requiring prn paralytics, atrial fibrillation-and concern for encephalopathy as she has been at times not responsive even when off sedation.  See below for further details   Subjective:    Jody Taylor today remains intubated and sedated.  Per nursing staff-no major issues overnight.   Assessment  & Plan :   Acute Hypoxic Resp Failure due to Covid 19 Viral pneumonia: Continue full ventilator support per PCCM.  Patient has completed maximal medical therapy including Remdesivir, Actemra, convalescent plasma and remains on steroids.  There was concern for superimposed bacterial pneumonia-and was briefly started on Zyvox and cefepime by PCCM-but since this is felt to be unlikely-all antibiotics have been discontinued on 7/7.  COVID-19 Labs:  Recent Labs    12/20/18 0405 12/20/18 0406 12/21/18 0450 12/22/18 0510  DDIMER 5.20*  --  6.80* 6.40*  FERRITIN  --  134 146 163  CRP  --  1.4* <0.8 <0.8    No results found for: SARSCOV2NAA   COVID-19  Medications: 6/29>> Actemra 6/29>> Remdesivir 6/29>> convalescent plasma 6/29>> Solu-Medrol  Vent Settings: Vent Mode: PCV FiO2 (%):  [40 %] 40 % Set Rate:  [28 bmp] 28 bmp PEEP:  [8 cmH20-10 cmH20] 8 cmH20 Plateau Pressure:  [22 cmH20-27 cmH20] 25 cmH20  ABG:    Component Value Date/Time   PHART 7.439 12/21/2018 0452   PCO2ART 38.3 12/21/2018 0452   PO2ART 61.0 (L) 12/21/2018 0452   HCO3 25.9 12/21/2018 0452   TCO2 27 12/21/2018 0452  ACIDBASEDEF 5.0 (H) 12/15/2018 1501   O2SAT 92.0 12/21/2018 0452    Acute toxic/metabolic encephalopathy: On 7/6-concern that patient was unresponsive- while not on sedation-this morning-responding to sternal rub-opens eyes-seems to withdraw extremities to pain-stopping transdermal fentanyl, oral benzos and Seroquel.  CT head on 7/6 without any acute abnormalities.  Reassess tomorrow once all above agents have washed out.  Even if she has had anoxic injury or a small stroke not evident on CT-doubt further work-up will change management outcome.  Steroid-induced hyperglycemia on top of prediabetes: CBGs stable-continue 20 months of Levemir and SSI-follow and adjust accordingly.  A1c 6.4.  HTN: BP stable-continue with as needed hydralazine  Hypernatremia: Resolved-KVO all IV fluids, continue free water via tube.  PAF: Remains rate controlled-seen on telemetry this morning.  Started on IV heparin.  Follow echo  Elevated d-dimer: Lower extremity Dopplers on 7/6 negative (preliminary results) for DVT-was on twice daily dosing of prophylactic Lovenox-now switched to IV heparin as patient is in atrial fibrillation  CKD stage III: Creatinine close to usual baseline-follow  CAD: No anginal symptoms evident-continue aspirin  Chronic diastolic heart failure: Appears compensated-continue Lasix-follow renal function, I/O's and daily weights  History of severe persistent asthma: No wheezing-continue bronchodilators-on benralizumab as  outpatient.  Dyslipidemia: Continue statin  Hypothyroidism: Continue Synthroid  Dyslipidemia: Continue statin  GERD: Continue PPI  Anxiety/depression: Holding Seroquel today  Condition - Extremely Guarded  Family Communication  : Son updated over the phone on 7/7  Code Status : DNR  Diet :  Diet Order    None       Disposition Plan  :  Remain inpatient  Consults  :  PCCM  Procedures  :    ETT>> 6/29 Right IJ [facility] at [facility]>> 6/29  GI prophylaxis: PPI  DVT Prophylaxis  : IV heparin  Lab Results  Component Value Date   PLT 198 12/22/2018    Inpatient Medications  Scheduled Meds:  sodium chloride   Intravenous Once   aspirin  81 mg Per Tube Daily   chlorhexidine  15 mL Mouth/Throat BID   Chlorhexidine Gluconate Cloth  6 each Topical Daily   dexamethasone (DECADRON) injection  6 mg Intravenous Q12H   free water  350 mL Per Tube Q4H   insulin aspart  0-15 Units Subcutaneous Q4H   insulin detemir  20 Units Subcutaneous Daily   levothyroxine  50 mcg Intravenous Daily   mouth rinse  15 mL Mouth Rinse 10 times per day   pantoprazole sodium  40 mg Per Tube Daily   QUEtiapine  50 mg Oral BID   sodium chloride flush  10-40 mL Intracatheter Q12H   vitamin C  500 mg Per Tube Daily   zinc sulfate  220 mg Per Tube Daily   Continuous Infusions:  sodium chloride 10 mL/hr at 12/22/18 1200   dexmedetomidine (PRECEDEX) IV infusion     feeding supplement (VITAL HIGH PROTEIN) 1,000 mL (12/20/18 1400)   heparin 850 Units/hr (12/22/18 1217)   midazolam Stopped (12/21/18 0912)   PRN Meds:.acetaminophen, fentaNYL (SUBLIMAZE) injection, hydrALAZINE, ipratropium-albuterol, nitroGLYCERIN, [DISCONTINUED] ondansetron **OR** ondansetron (ZOFRAN) IV, polyethylene glycol  Antibiotics  :    Anti-infectives (From admission, onward)   Start     Dose/Rate Route Frequency Ordered Stop   12/20/18 1230  linezolid (ZYVOX) IVPB 600 mg  Status:   Discontinued     600 mg 300 mL/hr over 60 Minutes Intravenous Every 12 hours 12/20/18 1210 12/21/18 1227   12/20/18 1230  ceFEPIme (MAXIPIME)  2 g in sodium chloride 0.9 % 100 mL IVPB  Status:  Discontinued     2 g 200 mL/hr over 30 Minutes Intravenous Every 12 hours 12/20/18 1229 12/22/18 1113   12/15/18 2000  remdesivir 100 mg in sodium chloride 0.9 % 250 mL IVPB     100 mg 500 mL/hr over 30 Minutes Intravenous Every 24 hours 12/01/2018 1845 12/18/18 2053   11/29/2018 2000  remdesivir 200 mg in sodium chloride 0.9 % 250 mL IVPB     200 mg 500 mL/hr over 30 Minutes Intravenous Once 12/04/2018 1845 12/03/2018 2116       Time Spent in minutes  45   Oren Binet M.D on 12/22/2018 at 12:45 PM  To page go to [email or web address] - use universal password  [facility] -  Office  [phone]  The patient is critically ill with multiple organ system failure and requires high complexity decision making for assessment and support, frequent evaluation and titration of therapies, advanced monitoring, review of radiographic studies and interpretation of complex data.   See all Orders from today for further details   Admit date - 12/15/2018    8    Objective:   Vitals:   12/22/18 0800 12/22/18 0829 12/22/18 1100 12/22/18 1200  BP: 103/71 103/71 103/62 (!) 120/56  Pulse: 80 74  78  Resp: (!) 25 (!) 27 (!) 28 (!) 24  Temp: 98.1 F (36.7 C)   98.5 F (36.9 C)  TempSrc: Axillary   Axillary  SpO2: 94% 96%  94%  Weight:      Height:        Wt Readings from Last 3 Encounters:  12/22/18 98.4 kg  03/02/18 95.5 kg  01/13/18 95.7 kg     Intake/Output Summary (Last 24 hours) at 12/22/2018 1245 Last data filed at 12/22/2018 1200 Gross per 24 hour  Intake 3470.43 ml  Output 1525 ml  Net 1945.43 ml     Physical Exam General appearance: Intubated-sedated-with vigorous sternal rub-she opens eyes-moves extremities-But does not follow commands. Eyes:no scleral icterus. HEENT: Atraumatic and  Normocephalic Neck: supple, no JVD. Resp:Good air entry bilaterally,no rales or rhonchi heard anteriorly CVS: S1 S2 regular, no murmurs.  GI: Bowel sounds present, Non tender and not distended with no gaurding, rigidity or rebound. Extremities: B/L Lower Ext shows no edema, both legs are warm to touch   Data Review:    CBC Recent Labs  Lab 12/16/18 0500 12/17/18 0455 12/18/18 0500  12/19/18 0500 12/20/18 0405 12/21/18 0007 12/21/18 0450 12/21/18 0452 12/22/18 0510  WBC 7.2 6.4 6.3  --  7.8 6.7  --  7.2  --  9.1  HGB 11.0* 11.2* 12.8   < > 11.6* 11.8* 10.5* 11.1* 10.5* 11.8*  HCT 34.8* 34.3* 39.5   < > 38.3 37.9 31.0* 35.8* 31.0* 37.7  PLT 205 220 238  --  230 226  --  200  --  198  MCV 95.6 95.3 94.3  --  98.5 100.0  --  99.7  --  99.5  MCH 30.2 31.1 30.5  --  29.8 31.1  --  30.9  --  31.1  MCHC 31.6 32.7 32.4  --  30.3 31.1  --  31.0  --  31.3  RDW 14.6 14.4 14.2  --  14.4 14.6  --  14.5  --  14.5  LYMPHSABS 0.4* 0.5* 0.5*  --  0.4*  --   --   --   --   --   MONOABS  0.5 0.5 0.5  --  0.5  --   --   --   --   --   EOSABS 0.0 0.0 0.0  --  0.0  --   --   --   --   --   BASOSABS 0.0 0.0 0.0  --  0.0  --   --   --   --   --    < > = values in this interval not displayed.    Chemistries  Recent Labs  Lab 12/18/18 0500  12/19/18 0500 12/20/18 0405 12/21/18 0007 12/21/18 0450 12/21/18 0452 12/22/18 0510  NA 143   < > 146* 151* 146* 148* 146* 144  K 3.6   < > 4.1 3.6 4.1 3.9 3.9 3.6  CL 105  --  111 112*  --  112*  --  111  CO2 28  --  24 29  --  26  --  26  GLUCOSE 234*  --  210* 144*  --  240*  --  156*  BUN 67*  --  84* 99*  --  79*  --  66*  CREATININE 1.24*  --  1.44* 1.52*  --  1.31*  --  1.07*  CALCIUM 7.7*  --  7.6* 8.0*  --  8.2*  --  7.9*  MG 1.9  --  2.2 2.3  --  2.2  --  2.3  AST 32  --  29 22  --  26  --  27  ALT 36  --  35 30  --  23  --  19  ALKPHOS 102  --  92 76  --  82  --  78  BILITOT 0.4  --  0.2* 0.1*  --  0.2*  --  0.3   < > = values in this  interval not displayed.   ------------------------------------------------------------------------------------------------------------------ No results for input(s): CHOL, HDL, LDLCALC, TRIG, CHOLHDL, LDLDIRECT in the last 72 hours.  Lab Results  Component Value Date   HGBA1C 6.4 (H) 12/20/2018   ------------------------------------------------------------------------------------------------------------------ No results for input(s): TSH, T4TOTAL, T3FREE, THYROIDAB in the last 72 hours.  Invalid input(s): FREET3 ------------------------------------------------------------------------------------------------------------------ Recent Labs    12/21/18 0450 12/22/18 0510  FERRITIN 146 163    Coagulation profile No results for input(s): INR, PROTIME in the last 168 hours.  Recent Labs    12/21/18 0450 12/22/18 0510  DDIMER 6.80* 6.40*    Cardiac Enzymes No results for input(s): CKMB, TROPONINI, MYOGLOBIN in the last 168 hours.  Invalid input(s): CK ------------------------------------------------------------------------------------------------------------------    Component Value Date/Time   BNP 240.7 (H) 12/16/2018 1300    Micro Results Recent Results (from the past 240 hour(s))  MRSA PCR Screening     Status: None   Collection Time: 11/27/2018  5:57 PM   Specimen: Nasal Mucosa; Nasopharyngeal  Result Value Ref Range Status   MRSA by PCR NEGATIVE NEGATIVE Final    Comment:        The GeneXpert MRSA Assay (FDA approved for NASAL specimens only), is one component of a comprehensive MRSA colonization surveillance program. It is not intended to diagnose MRSA infection nor to guide or monitor treatment for MRSA infections. Performed at [facility], [place] [address] [place], [place] [address]   Culture, respiratory (non-expectorated)     Status: None (Preliminary result)   Collection Time: 12/20/18 12:03 PM   Specimen: Tracheal Aspirate;  Respiratory  Result Value Ref Range Status   Specimen Description   Final  TRACHEAL ASPIRATE Performed at [facility], [place] [address], [place], [place] [address]    Special Requests   Final    Immunocompromised Performed at [facility], [place] [address], [place], [place] [address]    Gram Stain   Final    FEW WBC PRESENT, PREDOMINANTLY PMN RARE SQUAMOUS EPITHELIAL CELLS PRESENT MODERATE GRAM POSITIVE COCCI IN CLUSTERS RARE GRAM POSITIVE RODS RARE BUDDING YEAST SEEN    Culture   Final    FEW Consistent with normal respiratory flora. Performed at [facility], [place] [address], [place], [place] [address]    Report Status PENDING  Incomplete  Culture, Urine     Status: Abnormal   Collection Time: 12/20/18 12:03 PM   Specimen: Urine, Clean Catch  Result Value Ref Range Status   Specimen Description   Final    URINE, CLEAN CATCH Performed at [facility], [place] [address], [place], [place] [address]    Special Requests   Final    Immunocompromised Performed at [facility], [place] [address], [place], [place] [address]    Culture >=100,000 COLONIES/mL STAPHYLOCOCCUS EPIDERMIDIS (A)  Final   Report Status 12/22/2018 FINAL  Final   Organism ID, Bacteria STAPHYLOCOCCUS EPIDERMIDIS (A)  Final      Susceptibility   Staphylococcus epidermidis - MIC*    CIPROFLOXACIN >=8 RESISTANT Resistant     GENTAMICIN <=0.5 SENSITIVE Sensitive     NITROFURANTOIN <=16 SENSITIVE Sensitive     OXACILLIN >=4 RESISTANT Resistant     TETRACYCLINE <=1 SENSITIVE Sensitive     VANCOMYCIN 2 SENSITIVE Sensitive     TRIMETH/SULFA <=10 SENSITIVE Sensitive     CLINDAMYCIN <=0.25 SENSITIVE Sensitive     RIFAMPIN <=0.5 SENSITIVE Sensitive     Inducible Clindamycin NEGATIVE Sensitive     * >=100,000 COLONIES/mL STAPHYLOCOCCUS EPIDERMIDIS  Culture, blood (Routine X 2) w Reflex to ID Panel     Status: None (Preliminary result)    Collection Time: 12/20/18 12:40 PM   Specimen: BLOOD  Result Value Ref Range Status   Specimen Description   Final    BLOOD LEFT ANTECUBITAL Performed at [place] [address], [place], [place] [address]    Special Requests   Final    BOTTLES DRAWN AEROBIC ONLY Blood Culture adequate volume Performed at [place] [address] [place], [place] [address]    Culture   Final    NO GROWTH 2 DAYS Performed at [place] [address] [place], [place] [address]    Report Status PENDING  Incomplete  Culture, blood (Routine X 2) w Reflex to ID Panel     Status: None (Preliminary result)   Collection Time: 12/20/18 12:45 PM   Specimen: BLOOD  Result Value Ref Range Status   Specimen Description   Final    BLOOD LEFT HAND Performed at [place] [address], [place], [place] [address]    Special Requests   Final    BOTTLES DRAWN AEROBIC ONLY Blood Culture adequate volume Performed at [place] [address], [place], [place] [address]    Culture   Final    NO GROWTH 2 DAYS Performed at [facility] [address], [place], [place] [address]    Report Status PENDING  Incomplete    Radiology Reports Dg Abd 1 View  Result Date: 12/07/2018 CLINICAL DATA:  Orogastric tube placement. EXAM: ABDOMEN -  1 VIEW COMPARISON:  None FINDINGS: Orogastric tube tip is in the distal body of the stomach. No dilated bowel.  Previous lumbar fusion at L5-S1. IMPRESSION: OG tube tip is in the distal stomach. Electronically Signed   By: Lorriane Shire M.D.   On: 12/12/2018 20:47   Ct Head Wo Contrast  Result Date: 12/21/2018 CLINICAL DATA:  Unresponsive, altered level of consciousness unexplained, history of coronary artery disease post MI, asthma, CHF, hypertension, former smoker EXAM: CT HEAD WITHOUT CONTRAST TECHNIQUE: Contiguous axial images were obtained from the base of the skull through  the vertex without intravenous contrast. Sagittal and coronal MPR images reconstructed from axial data set. COMPARISON:  None FINDINGS: Brain: Generalized atrophy. Cavum septum pellucidum and vergae. No midline shift or mass effect. Small vessel chronic ischemic changes of deep cerebral white matter. 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 Streak artifacts from skull base. No definite intracranial hemorrhage, mass lesion or evidence of acute infarction. No extra-axial fluid collections. Vascular: Atherosclerotic calcifications of internal carotid arteries at skull base Skull: Intact Sinuses/Orbits: Small amount of fluid dependently in RIGHT sphenoid sinus. Remaining sinuses clear. Orbits clear. Other: N/A IMPRESSION: Atrophy with small vessel chronic ischemic changes of deep cerebral white matter. 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 No acute intracranial abnormalities. Electronically Signed   By: Lavonia Dana M.D.   On: 12/21/2018 16:49   Dg Chest Port 1 View  Result Date: 12/22/2018 CLINICAL DATA:  Endotracheal tube. EXAM: PORTABLE CHEST 1 VIEW COMPARISON:  12/20/2018. FINDINGS: Endotracheal tube, feeding tube, right PICC line stable position. Heart size stable. Diffuse prominent bilateral pulmonary interstitial prominence again noted. Low lung volumes with progressive atelectasis/infiltrates both lung bases. No prominent pleural effusion. No pneumothorax. IMPRESSION: 1.  Lines and tubes in stable position. 2. Diffuse prominent bilateral pulmonary interstitial prominence again noted. Low lung volumes with progressive atelectasis/infiltrates both lung bases. Electronically Signed   By: Marcello Moores  Register   On: 12/22/2018 06:44   Dg Chest Port 1 View  Result Date: 12/18/2018 CLINICAL DATA:  Shock. EXAM: PORTABLE CHEST 1 VIEW COMPARISON:  Radiograph of same day. FINDINGS: Stable cardiomediastinal silhouette. Endotracheal and feeding tubes are in good position. Right-sided PICC line is unchanged in position. No  pneumothorax or pleural effusion is noted. Stable diffuse bilateral lung opacities are noted consistent with pneumonia. Bony thorax unremarkable. IMPRESSION: Stable support apparatus. Stable bilateral lung opacities consistent with pneumonia. Aortic Atherosclerosis (ICD10-I70.0). Electronically Signed   By: Marijo Conception M.D.   On: 12/18/2018 17:20   Dg Chest Port 1 View  Result Date: 12/18/2018 CLINICAL DATA:  Shortness of breath, pneumonia, COVID-19 virus EXAM: PORTABLE CHEST 1 VIEW COMPARISON:  12/15/2018 FINDINGS: Endotracheal tube 3.2 cm above the carina. Exam is rotated to the left. Right upper extremity PICC line tip mid SVC level. Stable cardiomegaly and diffuse mixed interstitial and airspace opacities throughout both lungs. Low lung volumes persist. No significant change in aeration. No developing large effusion or pneumothorax. Aorta atherosclerotic. IMPRESSION: Stable cardiomegaly and diffuse mixed interstitial and airspace process compatible with pneumonia/viral pneumonia. Electronically Signed   By: Jerilynn Mages.  Shick M.D.   On: 12/18/2018 11:12   Dg Chest Port 1 View  Result Date: 12/15/2018 CLINICAL DATA:  Pulmonary infiltrates. Endotracheally intubated. COVID-19. EXAM: PORTABLE CHEST 1 VIEW 10:25 a.m. COMPARISON:  12/15/2018 at 5:09 a.m. and 11/23/2018 FINDINGS: Endotracheal tube is in good position 4 cm above the carina. NG tube tip is below the diaphragm. Central line tip is in the superior vena cava just below the  carina, unchanged. Extensive bilateral pulmonary infiltrates persist, slightly increased at the left base. No effusions.  No acute bone abnormality. IMPRESSION: Slight progression of pulmonary infiltrates at the left base. No other change. Electronically Signed   By: Lorriane Shire M.D.   On: 12/15/2018 11:15   Portable Chest 1 View  Result Date: 12/15/2018 CLINICAL DATA:  Shortness of breath.  COVID-19. EXAM: PORTABLE CHEST 1 VIEW COMPARISON:  12/04/2018. FINDINGS: Patient is  rotated to the right. Endotracheal tube tip noted just above the right mainstem bronchus. Retraction of approximately 2 cm suggested. NG tube noted with tip below left hemidiaphragm. Right IJ line stable position. Heart size stable. Diffuse severe bilat interstitial infiltrates are again noted. Persistent low lung volumes. No pleural effusion or pneumothorax. IMPRESSION: 1. Endotracheal tube tip is noted just above the right mainstem bronchus. Retraction of approximately 2 cm suggested. NG tube and right IJ line stable position. 2. Diffuse severe bilateral pulmonary interstitial infiltrates again noted. Persistent low lung volumes. Critical Value/emergent results were called by telephone at the time of interpretation on 12/15/2018 at 7:01 am to nurse Elmyra Ricks, who verbally acknowledged these results. Electronically Signed   By: Marcello Moores  Register   On: 12/15/2018 07:02   Dg Chest Port 1 View  Result Date: 12/15/2018 CLINICAL DATA:  Acute respiratory failure with hypoxia. EXAM: PORTABLE CHEST 1 VIEW COMPARISON:  December 14, 2018 FINDINGS: The endotracheal tube terminates above the carina by approximately 2.1 cm. The right-sided central venous catheter is well position. The enteric tube extends below the left hemidiaphragm. The heart size is enlarged. Aortic calcifications are noted. Again seen are diffuse bilateral hazy airspace opacities with some significant improvement from prior study. This may be in part due to approved imaging technique. There is no definite pneumothorax. There are likely small bilateral pleural effusions. IMPRESSION: 1. Lines and tubes as above. 2. Persistent but slightly improved multifocal airspace opacities which can be seen in patients with pulmonary edema or an atypical infectious process. Electronically Signed   By: Constance Holster M.D.   On: 11/28/2018 19:07   Dg Chest Port 1v Same Day  Result Date: 12/20/2018 CLINICAL DATA:  Dyspnea, COVID-19 positive EXAM: PORTABLE CHEST 1 VIEW  COMPARISON:  12/18/2018 chest radiograph. FINDINGS: Right PICC terminates in upper third of the SVC. Endotracheal tube tip is 4.1 cm above the carina. Enteric tube enters stomach with the tip not seen on this image. Stable cardiomediastinal silhouette with normal heart size. No pneumothorax. No pleural effusion. Patchy hazy opacities throughout both lungs without appreciable change. IMPRESSION: 1. Well-positioned support structures. 2. Patchy hazy opacities throughout both lungs without appreciable change, compatible with multilobar pneumonia. Electronically Signed   By: Ilona Sorrel M.D.   On: 12/20/2018 10:15   Vas [place] Lower Extremity Venous (dvt)  Result Date: 12/21/2018  Lower Venous Study Indications: Swelling, and hypoxic-intubated and elevated d-dimer. and positive for Covid-19.  Comparison Study: No prior. Performing Technologist: Oda Cogan RDMS, RVT  Examination Guidelines: A complete evaluation includes B-mode imaging, spectral Doppler, color Doppler, and power Doppler as needed of all accessible portions of each vessel. Bilateral testing is considered an integral part of a complete examination. Limited examinations for reoccurring indications may be performed as noted.  +---------+---------------+---------+-----------+----------+-------+  RIGHT     Compressibility Phasicity Spontaneity Properties Summary  +---------+---------------+---------+-----------+----------+-------+  CFV       Full            Yes       Yes                             +---------+---------------+---------+-----------+----------+-------+  SFJ       Full                                                      +---------+---------------+---------+-----------+----------+-------+  FV Prox   Full                                                      +---------+---------------+---------+-----------+----------+-------+  FV Mid    Full                                                       +---------+---------------+---------+-----------+----------+-------+  FV Distal Full                                                      +---------+---------------+---------+-----------+----------+-------+  PFV       Full                                                      +---------+---------------+---------+-----------+----------+-------+  POP       Full            Yes       Yes                             +---------+---------------+---------+-----------+----------+-------+  PTV       Full                                                      +---------+---------------+---------+-----------+----------+-------+  PERO      Full                                                      +---------+---------------+---------+-----------+----------+-------+   +---------+---------------+---------+-----------+----------+-------+  LEFT      Compressibility Phasicity Spontaneity Properties Summary  +---------+---------------+---------+-----------+----------+-------+  CFV       Full            Yes       Yes                             +---------+---------------+---------+-----------+----------+-------+  SFJ       Full                                                      +---------+---------------+---------+-----------+----------+-------+  FV Prox   Full                                                      +---------+---------------+---------+-----------+----------+-------+  FV Mid    Full                                                      +---------+---------------+---------+-----------+----------+-------+  FV Distal Full                                                      +---------+---------------+---------+-----------+----------+-------+  PFV       Full                                                      +---------+---------------+---------+-----------+----------+-------+  POP       Full            Yes       Yes                             +---------+---------------+---------+-----------+----------+-------+  PTV       Full                                                       +---------+---------------+---------+-----------+----------+-------+  PERO      Full                                                      +---------+---------------+---------+-----------+----------+-------+     Summary: Right: There is no evidence of deep vein thrombosis in the lower extremity. Left: There is no evidence of deep vein thrombosis in the lower extremity.  *See table(s) above for measurements and observations. Electronically signed by Ruta Hinds MD on 12/21/2018 at 8:37:01 PM.    Final    [place] Ekg Site Rite  Result Date: 12/04/2018 If Site Rite image not attached, placement could not be confirmed due to current cardiac rhythm.

## 2018-12-22 NOTE — Progress Notes (Signed)
ANTICOAGULATION CONSULT NOTE - Initial Consult  Pharmacy Consult for heparin Indication: atrial fibrillation  Allergies  Allergen Reactions  . Ace Inhibitors Other (See Comments) and Cough    CHEST PAIN  . Other     Patient reports receiving blood after a miscarriage "years ago". She states she broke out from receiving this blood. Has not received any since  . Codeine Nausea And Vomiting  . Levaquin [Levofloxacin] Nausea And Vomiting    Patient Measurements: Height: 4\' 10"  (147.3 cm) Weight: 216 lb 14.9 oz (98.4 kg) IBW/kg (Calculated) : 40.9 Heparin Dosing Weight: 64 kg   Vital Signs: Temp: 97.7 F (36.5 C) (07/07 2000) Temp Source: Axillary (07/07 2000) BP: 119/87 (07/07 2200) Pulse Rate: 87 (07/07 2200)  Labs: Recent Labs    12/20/18 0405  12/21/18 0450 12/21/18 0452 12/22/18 0510 12/22/18 2145  HGB 11.8*   < > 11.1* 10.5* 11.8*  --   HCT 37.9   < > 35.8* 31.0* 37.7  --   PLT 226  --  200  --  198  --   HEPARINUNFRC  --   --   --   --   --  1.46*  CREATININE 1.52*  --  1.31*  --  1.07*  --    < > = values in this interval not displayed.    Estimated Creatinine Clearance: 45.1 mL/min (A) (by C-G formula based on SCr of 1.07 mg/dL (H)).   Medical History: Past Medical History:  Diagnosis Date  . 2-vessel coronary artery disease   . Adult hypothyroidism   . Allergic rhinitis 09/12/2016  . Anemia of chronic disease   . Arthritis   . Asthma   . CAD in native artery 05/02/2015   Overview:   S/P PCI and stent x 2. Last cardiac cath August 2010 with mild nonobstructive CAD and normal LV function PCI and stent of proximal [facility] 1999 Cath Nov 2016:Angiographic findings Cardiac Arteries and Lesion Findings LMCA: Normal. LAD: Normal. LCx: Normal. RCA: Abnormal. Lesion on R PDA: Ostial.35% stenosis 5 mm length . Pre procedure TIMI III flow was noted. Good run off was present.Bifurcation lesion.  Cath 05/08/15:Mild non-obstructive coronary artery disease. Normal LV function   . Cancer ([place]) 03/31/2017  . Chronic diastolic CHF (congestive heart failure) ([place])   . Chronic GERD   . Chronic kidney disease, stage 3 (moderate) ([place])    patient denies (listed in 07/20/16 PCP notes-Dr. Nelda Bucks)  . CKD (chronic kidney disease) 06/05/2015  . Colon polyp   . Cough 09/12/2016  . Depression    since hysterectomy   . Dyspnea 09/12/2016  . Hallucinations 08/17/2013  . Heart attack ([place])   . Hyperlipidemia LDL goal <70   . Hypertension, essential, benign   . Obstructive sleep apnea 09/12/2016  . Polio   . PONV (postoperative nausea and vomiting)   . PONV (postoperative nausea and vomiting)   . 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 Sleep apnea    wears CPAP  . Spondylolisthesis of lumbosacral region 11/07/2016  . Stroke [facility])    "i had a mini stroke I didn't even know I had it" found on MRI  . Vaginal atrophy 01/30/2016  . Vitamin D deficiency   . Wheezing 09/12/2016    Medications:  Facility-Administered Medications Prior to Admission  Medication Dose Route Frequency Provider Last Rate Last Dose  . Benralizumab SOSY 30 mg  30 mg Subcutaneous Q8 Weeks Kozlow, Donnamarie Poag, MD   30 mg at 10/22/18 1120  Medications Prior to Admission  Medication Sig Dispense Refill Last Dose  . cefUROXime (CEFTIN) 250 MG tablet Take 250 mg by mouth 2 (two) times a day. Take 250mg  by mouth twice daily for seven (7) days     . levothyroxine (SYNTHROID) 100 MCG tablet    unknown  . methylPREDNISolone (MEDROL DOSEPAK) 4 MG TBPK tablet Take 1-6 tablets by mouth See admin instructions. Take 6 tabs by mouth the first day, then decrease dosage by 1 tablet each day until complete.   unknown  . montelukast (SINGULAIR) 10 MG tablet TAKE ONE TABLET BY MOUTH AT BEDTIME (Patient taking differently: Take 10 mg by mouth at bedtime. ) 30 tablet 0 unknown  . nitroGLYCERIN (NITROSTAT) 0.4 MG SL tablet      . promethazine (PHENERGAN) 25 MG tablet Take 25 mg by mouth 4 (four) times daily as needed  for nausea or vomiting.    unknown  . TRELEGY ELLIPTA 100-62.5-25 MCG/INH AEPB Inhale 1 Dose into the lungs daily. Rinse, gargle, and spit after use. 86 each 5 unknown    Assessment: 76 YOF diagnosed with COVID. She developed Afib during her ICU stay with CHADSVASc score of 7 (Age, female gender, HTN, stroke, CHF). Pharmacy consulted to start IV heparin. She is currently on Lovenox for VTE prophylaxis.  H/H low stable, Plt wnl   First heparin level elevated at 1.46. No issues noted. The lab was a peripheral stick.  Goal of Therapy:  Heparin level 0.3-0.5 units/ml Monitor platelets by anticoagulation protocol: Yes   Plan:  Hold heparin gtt for 1 hr Then restart heparin gtt at at 700 units/hr Monitor daily heparin level, CBC, s/s of bleed   Elenor Quinones, PharmD, BCPS, BCIDP Clinical Pharmacist 12/22/2018 11:26 PM

## 2018-12-22 NOTE — Progress Notes (Signed)
NAME:  Jody Taylor, MRN:  [id number], DOB:  Jan 24, 1941, LOS: 8 ADMISSION DATE:  11/27/2018, CONSULTATION DATE:  12/02/2018 REFERRING MD:  Sloan Leiter, CHIEF COMPLAINT:  Dyspnea   Brief History   Female with a history of diastolic heart failure admitted on June 29 for ARDS in the setting of COVID-19 pneumonia.  Required intubation at [facility] emergency room.  Past Medical History  Diastolic heart failure History of stroke (mini stroke) Obstructive sleep apnea on CPAP Hypertension Hyperlipidemia Coronary artery disease, had PCI in 3532 Chronic diastolic heart failure Chronic kidney disease GERD Allergic rhinitis  Significant Hospital Events   June 29 admission July 2, severe vent dyssynchrony, multiple ventilator changes, pressure support, SIMV July 3, oxygenation worsening overnight, tidal volume 12cc/kg IBW on SIMV July 4 -  oxygenation worsening overnight, tidal volume 12cc/kg IBW on SIMV July 5 - December 20, 2018- on Versed and fentanyl drips.  Last paralytic dose was 48 hours ago.  On 50% FiO2 with a PEEP of 12 -> PO2 58.  Making urine but mild AKI with a creatinine of 1.52 and rising sodium to 151...  Concern from previous days that she might have developed some ventilator associated lung injury.  Overall no change.  Currently in supine ventilation.  Off levophed. Noted at the time of this dictation patient got dyssynchronous with the ventilator and desaturated.  Order for as needed paralytic given. Having rising temperature particularly since December 18, 2018 with a T-max of 101 today   7/6 - fent 200, versed 2mg  gtt.  Needed prn vec x 2 last ngiht -> changed to PCV and etter.  Now supine . 40% fio2, peep 12 -> pulse ox 91%. Not on perssors . Making urine.     Consults:  Pulmonary and critical care medicine  Procedures:  June 29 endotracheal tube> June 29 right internal jugular central venous line>   Significant Diagnostic Tests:    Micro Data:  June 26 SARS-CoV-2  positive 7/5 urine - 100k staph epidermidis  Antimicrobials:  June 29 remdesivir June 29 Actemra June 29 solumedrol - decadron 7/1 >> June 29 convalescent plasma  ............................. 7/5  zyvox > 7/6 7/5 - cefepime > 7.6    Interim history/subjective:    7/7 =- more awake (wd to pain and opens eyes) . CT head with  Old infarcts. Duplex LE - neg for DVT. Peep reduced from 10 to 5 -> pulse ox 74%. No pressors. Making urine.  Growing STaph epiddermidis in urine but PCT x 3 negative. Fever did respond to antibiotics\  Slow A Fib + HR 66. CHASVAS2  = 7 per pharmacy  Objective   Blood pressure 103/71, pulse 74, temperature 98.1 F (36.7 C), temperature source Axillary, resp. rate (!) 27, height 4\' 10"  (1.473 m), weight 98.4 kg, SpO2 96 %.    Vent Mode: PCV FiO2 (%):  [40 %] 40 % Set Rate:  [28 bmp] 28 bmp PEEP:  [8 DJM42-68 cmH20] 8 cmH20 Plateau Pressure:  [22 cmH20-27 cmH20] 25 cmH20   Intake/Output Summary (Last 24 hours) at 12/22/2018 1058 Last data filed at 12/22/2018 0800 Gross per 24 hour  Intake 2852.1 ml  Output 1185 ml  Net 1667.1 ml   Filed Weights   12/19/18 0500 12/21/18 0500 12/22/18 0500  Weight: 94.1 kg 96.9 kg 98.4 kg    General Appearance:  Looks criticall ill OBESE - + Head:  Normocephalic, without obvious abnormality, atraumatic Eyes:  PERRL - yesm, conjunctiva/corneas - muddu  Ears:  Normal external ear canals, both ears Nose:  G tube - no Throat:  ETT TUBE - yes , OG tube - yes Neck:  Supple,  No enlargement/tenderness/nodules Lungs: Clear to auscultation bilaterally, Ventilator   Synchrony - yes Heart:  S1 and S2 normal, no murmur, CVP - no.  Pressors - no Abdomen:  Soft, no masses, no organomegaly Genitalia / Rectal:  Not done Extremities:  Extremities- intact edema + Skin:  ntact in exposed areas . Sacral area - not examined Neurologic:  Sedation - klnopin schedule, seeroquel, fent patch -> RASS - -3 . Moves all 4s - wtihdraws to  pain. CAM-ICU - x . Orientation - x    Resolved Hospital Problem list     Assessment & Plan:  #Baseline - ashma? on Fasenra as outpatient, unclear pulmonary function testing, notes from [place] pulmonary just inidcate allergic rhinitis and cough    ARDS due to COVID-19 pneumonia with acute resp failure   12/22/2018 - > does not meet criteria for SBT/Extubation in setting of Acute Respiratory Failure due to ARDS though improved and encephalopathy though improving  PLAN - contnue decadron (need end dat) - PRVC  - ARDS protocol - dc paralytics prn (See CNS Section) - If worse, prone - Adjust RASS score to 0 to -1  Because of unresponsiveness 12/21/2018  - prn alb   Acute encephalopathy  - unrespnsiove off sedation gtt 12/21/2018 . CT head with old infarcts - some better 12/22/2018  Plan  - dc fent patch - dc klonopin - cotninue seroquel  - dc fent gtt  - dc versed gtt - start precedex gtt  - do fent prn  - if +ve WUA can be demonstrated -> and vent dysnch-> go back to fent gtt + versed gtt + prn paralytic    Sepsis witg ever  - new on 12/20/2018, r but PCT negative x 3 - Staph epidermidis on urine ? contaminant  PLAN  - dc all abx   AKI with hypernatermia   - improved 7/6  PLAN  -free water   Hx of chronic Diastolic heart failure: HOld diuresis Continue to hold metoprolol and nitrates    Best practice:  Diet: tube feeding Pain/Anxiety/Delirium protocol (if indicated): as above VAP protocol (if indicated): yes DVT prophylaxis: lovenox GI prophylaxis: Pantoprazole for stress ulcer prophylaxis Glucose control: SSI Mobility: passive range of motion Code Status: full Family Communication: per triad Disposition: remain in ICU   ATTESTATION & SIGNATURE   The patient Jody Taylor is critically ill with multiple organ systems failure and requires high complexity decision making for assessment and support, frequent evaluation and titration of therapies,  application of advanced monitoring technologies and extensive interpretation of multiple databases.   Critical Care Time devoted to patient care services described in this note is  30  Minutes. This time reflects time of care of this signee Dr Brand Males. This critical care time does not reflect procedure time, or teaching time or supervisory time of PA/NP/Med student/Med Resident etc but could involve care discussion time     Dr. Brand Males, M.D., [facility].C.P Pulmonary and Critical Care Medicine Staff Physician [facility] Pager: [phone], If no answer or between  15:00h - 7:00h: call [phone]  12/22/2018 10:58 AM    LABS for reference  [facility]  Lab 12/15/18 1501 12/18/18 0829 12/21/18 0007 12/21/18 0452  PHART 7.311* 7.424  7.328* 7.439  PCO2ART 41.5 41.7 54.6* 38.3  PO2ART 76.0* 58.0* 70.0* 61.0*  HCO3 20.9 27.4 28.5* 25.9  TCO2 22 29 30 27   O2SAT 94.0 91.0 91.0 92.0    CBC Recent Labs  Lab 12/20/18 0405  12/21/18 0450 12/21/18 0452 12/22/18 0510  HGB 11.8*   < > 11.1* 10.5* 11.8*  HCT 37.9   < > 35.8* 31.0* 37.7  WBC 6.7  --  7.2  --  9.1  PLT 226  --  200  --  198   < > = values in this interval not displayed.    COAGULATION No results for input(s): INR in the last 168 hours.  CARDIAC  No results for input(s): TROPONINI in the last 168 hours. No results for input(s): PROBNP in the last 168 hours.   CHEMISTRY Recent Labs  Lab 12/15/18 1850 12/16/18 0500  12/18/18 0500  12/19/18 0500 12/20/18 0405 12/21/18 0007 12/21/18 0450 12/21/18 0452 12/22/18 0510  NA  --  141   < > 143   < > 146* 151* 146* 148* 146* 144  K  --  4.2   < > 3.6   < > 4.1 3.6 4.1 3.9 3.9 3.6  CL  --  110   < > 105  --  111 112*  --  112*  --  111  CO2  --  21*   < > 28  --  24 29  --  26  --  26  GLUCOSE  --  212*   < > 234*  --  210* 144*  --  240*  --  156*  BUN  --  53*   < > 67*  --   84* 99*  --  79*  --  66*  CREATININE  --  1.61*   < > 1.24*  --  1.44* 1.52*  --  1.31*  --  1.07*  CALCIUM  --  7.9*   < > 7.7*  --  7.6* 8.0*  --  8.2*  --  7.9*  MG 2.0 2.1  --  1.9  --  2.2 2.3  --  2.2  --  2.3  PHOS 2.9 2.8  --   --   --   --   --   --   --   --   --    < > = values in this interval not displayed.   Estimated Creatinine Clearance: 45.1 mL/min (A) (by C-G formula based on SCr of 1.07 mg/dL (H)).   LIVER Recent Labs  Lab 12/18/18 0500 12/19/18 0500 12/20/18 0405 12/21/18 0450 12/22/18 0510  AST 32 29 22 26 27   ALT 36 35 30 23 19   ALKPHOS 102 92 76 82 78  BILITOT 0.4 0.2* 0.1* 0.2* 0.3  PROT 5.3* 4.7* 4.5* 4.5* 4.3*  ALBUMIN 2.5* 2.3* 2.2* 2.1* 2.0*     INFECTIOUS Recent Labs  Lab 12/20/18 1330 12/21/18 0450 12/22/18 0510  LATICACIDVEN 1.3  --   --   PROCALCITON <0.10 <0.10 <0.10     ENDOCRINE CBG (last 3)  Recent Labs    12/21/18 2321 12/22/18 0405 12/22/18 0751  GLUCAP 128* 140* 135*         IMAGING x48h  - image(s) personally visualized  -   highlighted in bold Ct Head Wo Contrast  Result Date: 12/21/2018 CLINICAL DATA:  Unresponsive, altered level of consciousness unexplained, history of coronary artery disease post MI, asthma, CHF, hypertension, former smoker EXAM: CT  HEAD WITHOUT CONTRAST TECHNIQUE: Contiguous axial images were obtained from the base of the skull through the vertex without intravenous contrast. Sagittal and coronal MPR images reconstructed from axial data set. COMPARISON:  None FINDINGS: Brain: Generalized atrophy. Cavum septum pellucidum and vergae. No midline shift or mass effect. Small vessel chronic ischemic changes of deep cerebral white matter. 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 Streak artifacts from skull base. No definite intracranial hemorrhage, mass lesion or evidence of acute infarction. No extra-axial fluid collections. Vascular: Atherosclerotic calcifications of internal carotid arteries at skull base  Skull: Intact Sinuses/Orbits: Small amount of fluid dependently in RIGHT sphenoid sinus. Remaining sinuses clear. Orbits clear. Other: N/A IMPRESSION: Atrophy with small vessel chronic ischemic changes of deep cerebral white matter. 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 No acute intracranial abnormalities. Electronically Signed   By: Lavonia Dana M.D.   On: 12/21/2018 16:49   Dg Chest Port 1 View  Result Date: 12/22/2018 CLINICAL DATA:  Endotracheal tube. EXAM: PORTABLE CHEST 1 VIEW COMPARISON:  12/20/2018. FINDINGS: Endotracheal tube, feeding tube, right PICC line stable position. Heart size stable. Diffuse prominent bilateral pulmonary interstitial prominence again noted. Low lung volumes with progressive atelectasis/infiltrates both lung bases. No prominent pleural effusion. No pneumothorax. IMPRESSION: 1.  Lines and tubes in stable position. 2. Diffuse prominent bilateral pulmonary interstitial prominence again noted. Low lung volumes with progressive atelectasis/infiltrates both lung bases. Electronically Signed   By: Marcello Moores  Register   On: 12/22/2018 06:44   Vas [place] Lower Extremity Venous (dvt)  Result Date: 12/21/2018  Lower Venous Study Indications: Swelling, and hypoxic-intubated and elevated d-dimer. and positive for Covid-19.  Comparison Study: No prior. Performing Technologist: Oda Cogan RDMS, RVT  Examination Guidelines: A complete evaluation includes B-mode imaging, spectral Doppler, color Doppler, and power Doppler as needed of all accessible portions of each vessel. Bilateral testing is considered an integral part of a complete examination. Limited examinations for reoccurring indications may be performed as noted.  +---------+---------------+---------+-----------+----------+-------+  RIGHT     Compressibility Phasicity Spontaneity Properties Summary  +---------+---------------+---------+-----------+----------+-------+  CFV       Full            Yes       Yes                              +---------+---------------+---------+-----------+----------+-------+  SFJ       Full                                                      +---------+---------------+---------+-----------+----------+-------+  FV Prox   Full                                                      +---------+---------------+---------+-----------+----------+-------+  FV Mid    Full                                                      +---------+---------------+---------+-----------+----------+-------+  FV Distal Full                                                      +---------+---------------+---------+-----------+----------+-------+  PFV       Full                                                      +---------+---------------+---------+-----------+----------+-------+  POP       Full            Yes       Yes                             +---------+---------------+---------+-----------+----------+-------+  PTV       Full                                                      +---------+---------------+---------+-----------+----------+-------+  PERO      Full                                                      +---------+---------------+---------+-----------+----------+-------+   +---------+---------------+---------+-----------+----------+-------+  LEFT      Compressibility Phasicity Spontaneity Properties Summary  +---------+---------------+---------+-----------+----------+-------+  CFV       Full            Yes       Yes                             +---------+---------------+---------+-----------+----------+-------+  SFJ       Full                                                      +---------+---------------+---------+-----------+----------+-------+  FV Prox   Full                                                      +---------+---------------+---------+-----------+----------+-------+  FV Mid    Full                                                      +---------+---------------+---------+-----------+----------+-------+  FV Distal Full                                                       +---------+---------------+---------+-----------+----------+-------+  PFV       Full                                                      +---------+---------------+---------+-----------+----------+-------+  POP       Full            Yes       Yes                             +---------+---------------+---------+-----------+----------+-------+  PTV       Full                                                      +---------+---------------+---------+-----------+----------+-------+  PERO      Full                                                      +---------+---------------+---------+-----------+----------+-------+     Summary: Right: There is no evidence of deep vein thrombosis in the lower extremity. Left: There is no evidence of deep vein thrombosis in the lower extremity.  *See table(s) above for measurements and observations. Electronically signed by Ruta Hinds MD on 12/21/2018 at 8:37:01 PM.    Final

## 2018-12-22 NOTE — Progress Notes (Signed)
Spoke with Daughter Oniya. Gave update on mother's care. Is very pleased with the care her mother has received at [facility]. Will facetime later in the day.

## 2018-12-23 ENCOUNTER — Inpatient Hospital Stay (HOSPITAL_COMMUNITY): Payer: Medicare HMO

## 2018-12-23 DIAGNOSIS — I4891 Unspecified atrial fibrillation: Secondary | ICD-10-CM

## 2018-12-23 LAB — COMPREHENSIVE METABOLIC PANEL
ALT: 23 U/L (ref 0–44)
AST: 30 U/L (ref 15–41)
Albumin: 2 g/dL — ABNORMAL LOW (ref 3.5–5.0)
Alkaline Phosphatase: 84 U/L (ref 38–126)
Anion gap: 9 (ref 5–15)
BUN: 65 mg/dL — ABNORMAL HIGH (ref 8–23)
CO2: 24 mmol/L (ref 22–32)
Calcium: 8.1 mg/dL — ABNORMAL LOW (ref 8.9–10.3)
Chloride: 109 mmol/L (ref 98–111)
Creatinine, Ser: 1.03 mg/dL — ABNORMAL HIGH (ref 0.44–1.00)
GFR calc Af Amer: >60 mL/min (ref >60)
GFR calc non Af Amer: 53 mL/min — ABNORMAL LOW (ref >60)
Glucose, Bld: 213 mg/dL — ABNORMAL HIGH (ref 70–99)
Potassium: 4.3 mmol/L (ref 3.5–5.1)
Sodium: 142 mmol/L (ref 135–145)
Total Bilirubin: 0.3 mg/dL (ref 0.3–1.2)
Total Protein: 4.6 g/dL — ABNORMAL LOW (ref 6.5–8.1)

## 2018-12-23 LAB — D-DIMER, QUANTITATIVE: D-Dimer, Quant: 3.29 ug/mL-FEU — ABNORMAL HIGH (ref 0.00–0.50)

## 2018-12-23 LAB — HEPARIN LEVEL (UNFRACTIONATED)
Heparin Unfractionated: 1.48 IU/mL — ABNORMAL HIGH (ref 0.30–0.70)
Heparin Unfractionated: 0.6 IU/mL (ref 0.30–0.70)

## 2018-12-23 LAB — CBC
HCT: 37.6 % (ref 36.0–46.0)
Hemoglobin: 11.9 g/dL — ABNORMAL LOW (ref 12.0–15.0)
MCH: 31.1 pg (ref 26.0–34.0)
MCHC: 31.6 g/dL (ref 30.0–36.0)
MCV: 98.2 fL (ref 80.0–100.0)
Platelets: 176 10*3/uL (ref 150–400)
RBC: 3.83 MIL/uL — ABNORMAL LOW (ref 3.87–5.11)
RDW: 14.7 % (ref 11.5–15.5)
WBC: 9.5 10*3/uL (ref 4.0–10.5)
nRBC: 0.3 % — ABNORMAL HIGH (ref 0.0–0.2)

## 2018-12-23 LAB — CULTURE, RESPIRATORY W GRAM STAIN: Culture: NORMAL

## 2018-12-23 LAB — FERRITIN: Ferritin: 27 ng/mL (ref 11–307)

## 2018-12-23 LAB — C-REACTIVE PROTEIN: CRP: <0.8 mg/dL (ref <1.0)

## 2018-12-23 LAB — GLUCOSE, CAPILLARY
Glucose-Capillary: 215 mg/dL — ABNORMAL HIGH (ref 70–99)
Glucose-Capillary: 203 mg/dL — ABNORMAL HIGH (ref 70–99)
Glucose-Capillary: 187 mg/dL — ABNORMAL HIGH (ref 70–99)
Glucose-Capillary: 237 mg/dL — ABNORMAL HIGH (ref 70–99)
Glucose-Capillary: 219 mg/dL — ABNORMAL HIGH (ref 70–99)

## 2018-12-23 MED ORDER — MIDAZOLAM HCL 2 MG/2ML IJ SOLN
INTRAMUSCULAR | Status: AC
  Filled 2018-12-23: qty 2

## 2018-12-23 MED ORDER — FENTANYL 2500MCG IN NS 250ML (10MCG/ML) PREMIX INFUSION
0.0000 ug/h | INTRAVENOUS | Status: DC
  Administered 2018-12-23: 25 ug/h via INTRAVENOUS
  Administered 2018-12-24: 09:00:00 100 ug/h via INTRAVENOUS
  Filled 2018-12-23 (×2): qty 250

## 2018-12-23 MED ORDER — FUROSEMIDE 10 MG/ML IJ SOLN
40.0000 mg | Freq: Once | INTRAMUSCULAR | Status: AC
  Administered 2018-12-23: 14:00:00 40 mg via INTRAVENOUS
  Filled 2018-12-23: qty 4

## 2018-12-23 MED ORDER — MIDAZOLAM HCL 2 MG/2ML IJ SOLN
1.0000 mg | INTRAMUSCULAR | Status: DC | PRN
  Administered 2018-12-23 – 2018-12-28 (×4): 2 mg via INTRAVENOUS
  Filled 2018-12-23 (×2): qty 2

## 2018-12-23 MED ORDER — HEPARIN (PORCINE) 25000 UT/250ML-% IV SOLN
500.0000 [IU]/h | INTRAVENOUS | Status: DC
  Administered 2018-12-23 – 2018-12-24 (×2): 500 [IU]/h via INTRAVENOUS
  Filled 2018-12-23: qty 250

## 2018-12-23 NOTE — Progress Notes (Signed)
ANTICOAGULATION CONSULT NOTE - Initial Consult  Pharmacy Consult for heparin Indication: atrial fibrillation  Allergies  Allergen Reactions  . Ace Inhibitors Other (See Comments) and Cough    CHEST PAIN  . Other     Patient reports receiving blood after a miscarriage "years ago". She states she broke out from receiving this blood. Has not received any since  . Codeine Nausea And Vomiting  . Levaquin [Levofloxacin] Nausea And Vomiting    Patient Measurements: Height: 4\' 10"  (147.3 cm) Weight: 217 lb 2.5 oz (98.5 kg) IBW/kg (Calculated) : 40.9 Heparin Dosing Weight: 64 kg   Vital Signs: Temp: 97 F (36.1 C) (07/08 0800) Temp Source: Oral (07/08 0800) BP: 145/72 (07/08 0800) Pulse Rate: 69 (07/08 0800)  Labs: Recent Labs    12/21/18 0450 12/21/18 0452 12/22/18 0510 12/22/18 2145 12/23/18 0612 12/23/18 0745  HGB 11.1* 10.5* 11.8*  --  11.9*  --   HCT 35.8* 31.0* 37.7  --  37.6  --   PLT 200  --  198  --  176  --   HEPARINUNFRC  --   --   --  1.46*  --  1.48*  CREATININE 1.31*  --  1.07*  --  1.03*  --     Estimated Creatinine Clearance: 46.9 mL/min (A) (by C-G formula based on SCr of 1.03 mg/dL (H)).   Medical History: Past Medical History:  Diagnosis Date  . 2-vessel coronary artery disease   . Adult hypothyroidism   . Allergic rhinitis 09/12/2016  . Anemia of chronic disease   . Arthritis   . Asthma   . CAD in native artery 05/02/2015   Overview:   S/P PCI and stent x 2. Last cardiac cath August 2010 with mild nonobstructive CAD and normal LV function PCI and stent of proximal [facility] 1999 Cath Nov 2016:Angiographic findings Cardiac Arteries and Lesion Findings LMCA: Normal. LAD: Normal. LCx: Normal. RCA: Abnormal. Lesion on R PDA: Ostial.35% stenosis 5 mm length . Pre procedure TIMI III flow was noted. Good run off was present.Bifurcation lesion.  Cath 05/08/15:Mild non-obstructive coronary artery disease. Normal LV function  . Cancer ([place]) 03/31/2017  . Chronic  diastolic CHF (congestive heart failure) ([place])   . Chronic GERD   . Chronic kidney disease, stage 3 (moderate) ([place])    patient denies (listed in 07/20/16 PCP notes-Dr. Nelda Bucks)  . CKD (chronic kidney disease) 06/05/2015  . Colon polyp   . Cough 09/12/2016  . Depression    since hysterectomy   . Dyspnea 09/12/2016  . Hallucinations 08/17/2013  . Heart attack ([place])   . Hyperlipidemia LDL goal <70   . Hypertension, essential, benign   . Obstructive sleep apnea 09/12/2016  . Polio   . PONV (postoperative nausea and vomiting)   . PONV (postoperative nausea and vomiting)   . 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 Sleep apnea    wears CPAP  . Spondylolisthesis of lumbosacral region 11/07/2016  . Stroke [facility])    "i had a mini stroke I didn't even know I had it" found on MRI  . Vaginal atrophy 01/30/2016  . Vitamin D deficiency   . Wheezing 09/12/2016    Medications:  Facility-Administered Medications Prior to Admission  Medication Dose Route Frequency Provider Last Rate Last Dose  . Benralizumab SOSY 30 mg  30 mg Subcutaneous Q8 Weeks Kozlow, Donnamarie Poag, MD   30 mg at 10/22/18 1120   Medications Prior to Admission  Medication Sig Dispense Refill Last Dose  .  cefUROXime (CEFTIN) 250 MG tablet Take 250 mg by mouth 2 (two) times a day. Take 250mg  by mouth twice daily for seven (7) days     . levothyroxine (SYNTHROID) 100 MCG tablet    unknown  . methylPREDNISolone (MEDROL DOSEPAK) 4 MG TBPK tablet Take 1-6 tablets by mouth See admin instructions. Take 6 tabs by mouth the first day, then decrease dosage by 1 tablet each day until complete.   unknown  . montelukast (SINGULAIR) 10 MG tablet TAKE ONE TABLET BY MOUTH AT BEDTIME (Patient taking differently: Take 10 mg by mouth at bedtime. ) 30 tablet 0 unknown  . nitroGLYCERIN (NITROSTAT) 0.4 MG SL tablet      . promethazine (PHENERGAN) 25 MG tablet Take 25 mg by mouth 4 (four) times daily as needed for nausea or vomiting.    unknown  .  TRELEGY ELLIPTA 100-62.5-25 MCG/INH AEPB Inhale 1 Dose into the lungs daily. Rinse, gargle, and spit after use. 22 each 5 unknown    Assessment: 39 YOF diagnosed with COVID. She developed Afib during her ICU stay with CHADSVASc score of 7 (Age, female gender, HTN, stroke, CHF). Pharmacy consulted to start IV heparin. She is currently on Lovenox for VTE prophylaxis.  H/H low stable, Plt wnl   Repeat heparin level remains elevated at 1.48. No issues noted. The lab was a peripheral stick.  Goal of Therapy:  Heparin level 0.3-0.5 units/ml Monitor platelets by anticoagulation protocol: Yes   Plan:  Hold heparin gtt for 1 hr Then restart heparin gtt at at 500 units/hr F/u HL in 6 hr. Will order another peripheral stick  Monitor daily heparin level, CBC, s/s of bleed   Albertina Parr, PharmD., BCPS Clinical Pharmacist Clinical phone for 12/23/18 until 5pm: [phone]

## 2018-12-23 NOTE — Progress Notes (Signed)
NAME:  Jody Taylor, MRN:  [id number], DOB:  1940/12/04, LOS: 9 ADMISSION DATE:  12/04/2018, CONSULTATION DATE:  6/29 REFERRING MD:  Sloan Leiter, CHIEF COMPLAINT:  Dyspnea   Brief History   78 y/o female with a history of diastolic heart failure admitted on June 29 for ARDS in setting of COVID 19 pneumonia.  Required intubation at [facility] emergency room.  Past Medical History  Diastolic heart failure History of stroke (mini stroke) Obstructive sleep apnea on CPAP Hypertension Hyperlipidemia Coronary artery disease, had PCI in 0347 Chronic diastolic heart failure Chronic kidney disease GERD Allergic rhinitis  Significant Hospital Events   June 29 admission July 2 severe vent dyssynchrony July 3 oxygenation worsening overnight, tvol 12cc/kg IB on SIMV, changed to [facility], paralytic used July 4 no acute events July 5 paralytic used July 6 no acute changed, fever, sedation minimized July 7 Afib July 8 severe dyssyncrhony, large tidal volumes, diaphoresis, fentanyl drip added back  Consults:  PCCM  Procedures:  June 29 endotracheal tube> June 29 right internal jugular central venous line>June 29 June 30 PICC >   Significant Diagnostic Tests:  7/6 CT head > hold infarcts 7/7 LE Doppler negative for DVT  Micro Data:  June 26 SARS-COV-2 Positivie July 5 Urine > staph epidermidis  Antimicrobials:  June 29 remdesivir June29Actemra June 29 solumedrol - decadron 7/1 >> June 29 convalescent plasma  ............................. 7/5  zyvox > 7/6 7/5 - cefepime > 7.6  Interim history/subjective:  Severe dyssynchrony this morning, flushed, diaphoretic, some eye-opening, does not follow commands, severe tachypnea, poor air movement, hypoxemia.  Objective   Blood pressure 140/63, pulse 94, temperature 98.2 F (36.8 C), temperature source Oral, resp. rate (!) 28, height 4\' 10"  (1.473 m), weight 98.5 kg, SpO2 (!) 88 %.    Vent Mode: PCV FiO2 (%):  [30 %-60 %] 60 %  Set Rate:  [28 bmp] 28 bmp PEEP:  [8 cmH20] 8 cmH20 Plateau Pressure:  [22 cmH20-28 cmH20] 28 cmH20   Intake/Output Summary (Last 24 hours) at 12/23/2018 4259 Last data filed at 12/23/2018 0600 Gross per 24 hour  Intake 1774.41 ml  Output 1405 ml  Net 369.41 ml   Filed Weights   12/21/18 0500 12/22/18 0500 12/23/18 0500  Weight: 96.9 kg 98.4 kg 98.5 kg    Examination:  General:  In bed on vent in respiratory distress HENT: NCAT ETT in place PULM: CTA B, vent supported breathing CV: RRR, no mgr GI: BS+, soft, nontender Derm: diaphoresis, flushed MSK: normal bulk and tone Neuro: eyes open, doesn't follow commands   Resolved Hospital Problem list     Assessment & Plan:  ARDS due to COVID 19 pneumonia: Oxygenation slowly improving but severely dyssynchronous this morning Add back fentanyl infusion, target RA SS goal -2 Continue full mechanical ventilatory support, resume PRVC targeting tidal volume 6 to 8 cc/kg ideal body weight Keep driving pressure less than 15 Keep plateau pressure less than 30 Agree with Lasix today Ventilator associated pneumonia prevention Pulmonary toilet, thick secretions noted today.  Acute encephalopathy Minimize sedation RA SS target -2 Add back fentanyl infusion  AKI : resolved Monitor BMET and UOP Replace electrolytes as needed  Diastolic heart failure Agree with Lasix today Telemetry monitoring Hemodynamic monitoring  Best practice:  Diet: tube feeding Pain/Anxiety/Delirium protocol (if indicated): RA SS goal -2, fentanyl infusion added today, use Precedex if needed, Versed as needed VAP protocol (if indicated): Yes DVT prophylaxis: heparin infusion GI prophylaxis: yes Pantoprazole for stress ulcer prophylaxis  Glucose control: SSI Mobility: bed rest Code Status: full Family Communication: per [facility] Disposition: remain in ICU  Labs   CBC: Recent Labs  Lab 12/17/18 0455 12/18/18 0500  12/19/18 0500 12/20/18 0405 12/21/18  0007 12/21/18 0450 12/21/18 0452 12/22/18 0510 12/23/18 0612  WBC 6.4 6.3  --  7.8 6.7  --  7.2  --  9.1 9.5  NEUTROABS 5.3 5.2  --  6.7  --   --   --   --   --   --   HGB 11.2* 12.8   < > 11.6* 11.8* 10.5* 11.1* 10.5* 11.8* 11.9*  HCT 34.3* 39.5   < > 38.3 37.9 31.0* 35.8* 31.0* 37.7 37.6  MCV 95.3 94.3  --  98.5 100.0  --  99.7  --  99.5 98.2  PLT 220 238  --  230 226  --  200  --  198 176   < > = values in this interval not displayed.    Basic Metabolic Panel: Recent Labs  Lab 12/18/18 0500  12/19/18 0500 12/20/18 0405 12/21/18 0007 12/21/18 0450 12/21/18 0452 12/22/18 0510 12/23/18 0612  NA 143   < > 146* 151* 146* 148* 146* 144 142  K 3.6   < > 4.1 3.6 4.1 3.9 3.9 3.6 4.3  CL 105  --  111 112*  --  112*  --  111 109  CO2 28  --  24 29  --  26  --  26 24  GLUCOSE 234*  --  210* 144*  --  240*  --  156* 213*  BUN 67*  --  84* 99*  --  79*  --  66* 65*  CREATININE 1.24*  --  1.44* 1.52*  --  1.31*  --  1.07* 1.03*  CALCIUM 7.7*  --  7.6* 8.0*  --  8.2*  --  7.9* 8.1*  MG 1.9  --  2.2 2.3  --  2.2  --  2.3  --    < > = values in this interval not displayed.   GFR: Estimated Creatinine Clearance: 46.9 mL/min (A) (by C-G formula based on SCr of 1.03 mg/dL (H)). Recent Labs  Lab 12/20/18 0405 12/20/18 1330 12/21/18 0450 12/22/18 0510 12/23/18 0612  PROCALCITON  --  <0.10 <0.10 <0.10  --   WBC 6.7  --  7.2 9.1 9.5  LATICACIDVEN  --  1.3  --   --   --     Liver Function Tests: Recent Labs  Lab 12/19/18 0500 12/20/18 0405 12/21/18 0450 12/22/18 0510 12/23/18 0612  AST 29 22 26 27 30   ALT 35 30 23 19 23   ALKPHOS 92 76 82 78 84  BILITOT 0.2* 0.1* 0.2* 0.3 0.3  PROT 4.7* 4.5* 4.5* 4.3* 4.6*  ALBUMIN 2.3* 2.2* 2.1* 2.0* 2.0*   No results for input(s): LIPASE, AMYLASE in the last 168 hours. No results for input(s): AMMONIA in the last 168 hours.  ABG    Component Value Date/Time   PHART 7.439 12/21/2018 0452   PCO2ART 38.3 12/21/2018 0452   PO2ART 61.0  (L) 12/21/2018 0452   HCO3 25.9 12/21/2018 0452   TCO2 27 12/21/2018 0452   ACIDBASEDEF 5.0 (H) 12/15/2018 1501   O2SAT 92.0 12/21/2018 0452     Coagulation Profile: No results for input(s): INR, PROTIME in the last 168 hours.  Cardiac Enzymes: No results for input(s): CKTOTAL, CKMB, CKMBINDEX, TROPONINI in the last 168 hours.  HbA1C: Hgb A1c MFr Bld  Date/Time Value Ref Range Status  12/20/2018 04:06 AM 6.4 (H) 4.8 - 5.6 % Final    Comment:    (NOTE) Pre diabetes:          5.7%-6.4% Diabetes:              >6.4% Glycemic control for   <7.0% adults with diabetes   11/08/2016 07:24 AM 5.6 4.8 - 5.6 % Final    Comment:    (NOTE)         Pre-diabetes: 5.7 - 6.4         Diabetes: >6.4         Glycemic control for adults with diabetes: <7.0     CBG: Recent Labs  Lab 12/22/18 1156 12/22/18 1555 12/22/18 2004 12/22/18 2340 12/23/18 0326  GLUCAP 142* 247* 226* 190* 215*     Critical care time: 35 minutes     Roselie Awkward, MD [place] P[facility]M Pager: [phone] Cell: [phone] If no response, call [phone]

## 2018-12-23 NOTE — Progress Notes (Signed)
Daughter Margaretha Sheffield called for an update. All questions answered. Support given. She was thankful for the update and the care. Will continue to closely monitor patient.

## 2018-12-23 NOTE — Progress Notes (Signed)
Spoke with family via video chat with face time. Family were very pleased to visualize Jody Taylor.

## 2018-12-23 NOTE — Progress Notes (Signed)
[facility] PCCM Evening rounds  Severe dyssynchrony continues Add low dose versed infusion Try to change back to [facility] after making that change  Roselie Awkward, MD [place] PCCM Pager: [phone] Cell: [phone] If no response, call [phone]

## 2018-12-23 NOTE — Progress Notes (Signed)
Nutrition Follow-up RD working remotely.  DOCUMENTATION CODES:   Morbid obesity  INTERVENTION:    Continue Vital High Protein at 50 ml/h via Cortrak to provide 1200 kcal, 105 gm protein, 1003 ml free water daily.  NUTRITION DIAGNOSIS:   Inadequate oral intake related to inability to eat as evidenced by NPO status.  Ongoing  GOAL:   Provide needs based on ASPEN/SCCM guidelines  Met with TF  MONITOR:   Vent status, TF tolerance, I & O's, Labs  ASSESSMENT:   78 yo female admitted with acute respiratory failure r/t COVID pneumonitis requiring intubation. PMH of severe asthma, anemia, CAD, CHF, vitamin D deficiency, HTN, HLD, GERD, CKD-3.  Patient remains intubated on ventilator support, treating for ARDS. MV: 12.8 L/min Temp (24hrs), Avg:98 F (36.7 C), Min:97 F (36.1 C), Max:98.6 F (37 C)   Cortrak in place, tip in stomach. Receiving Vital High Protein at 50 ml/h to provide 1200 kcal, 105 gm protein, 1003 ml free water daily. Free water flushes 350 ml every 4 hours. Tolerating well to meet 100% of estimated nutrition needs.   Labs reviewed.  CBG's: 215-203  Medications reviewed and include decadron, novolog, levemir, vitamin C, zinc.    NUTRITION - FOCUSED PHYSICAL EXAM:  deferred-working remotely  Diet Order:   Diet Order    None      EDUCATION NEEDS:   No education needs have been identified at this time  Skin:  Skin Assessment: Reviewed RN Assessment(MASD to abdominal skin folds)  Last BM:  7/2 (type7)  Height:   Ht Readings from Last 1 Encounters:  11/25/2018 4' 10" (1.473 m)    Weight:   Wt Readings from Last 1 Encounters:  12/23/18 98.5 kg    Ideal Body Weight:  43.9 kg  BMI:  Body mass index is 45.39 kg/m.  Estimated Nutritional Needs:   Kcal:  0347-4259  Protein:  90-110 gm  Fluid:  >/= 1.5 L    Molli Barrows, RD, LDN, [place] Pager [phone] After Hours Pager [phone]

## 2018-12-23 NOTE — Progress Notes (Addendum)
PROGRESS NOTE  Jody Taylor XIP:[id number] DOB: 1941/02/21 DOA: 12/09/2018  PCP: Nicoletta Dress, MD  Brief History/Interval Summary: Patient is a 78 y.o. female with PMHx of HTN, CAD, chronic diastolic heart failure, severe persistent asthma on benralizumab, hypothyroidism, CKD stage III-presented to [facility] with acute respiratory distress-was found to have acute hypoxic respiratory failure secondary to COVID-19 pneumonia and emergently intubated and subsequently transferred to [facility] course complicated by ventilator dyssynchrony requiring prn paralytics, atrial fibrillation-and concern for encephalopathy as she has been at times not responsive even when off sedation.  See below for further details  Reason for Visit: Acute respiratory disease due to COVID-19  Consultants: Pulmonology  Procedures:  Intubation.   Right internal jugular central venous catheter placement 6/29  Transthoracic echocardiogram 1. The left ventricle has normal systolic function with an ejection fraction of 60-65%. The cavity size was normal. There is moderate asymmetric left ventricular hypertrophy. Left ventricular diastolic parameters were normal.  2. The right ventricle has normal systolic function. The cavity was normal. There is no increase in right ventricular wall thickness.  3. No evidence of mitral valve stenosis.  4. No stenosis of the aortic valve.  5. The aortic root and ascending aorta are normal in size and structure.  6. The interatrial septum was not assessed.  Antibiotics: Anti-infectives (From admission, onward)   Start     Dose/Rate Route Frequency Ordered Stop   12/20/18 1230  linezolid (ZYVOX) IVPB 600 mg  Status:  Discontinued     600 mg 300 mL/hr over 60 Minutes Intravenous Every 12 hours 12/20/18 1210 12/21/18 1227   12/20/18 1230  ceFEPIme (MAXIPIME) 2 g in sodium chloride 0.9 % 100 mL IVPB  Status:  Discontinued     2 g 200 mL/hr over 30  Minutes Intravenous Every 12 hours 12/20/18 1229 12/22/18 1113   12/15/18 2000  remdesivir 100 mg in sodium chloride 0.9 % 250 mL IVPB     100 mg 500 mL/hr over 30 Minutes Intravenous Every 24 hours 11/26/2018 1845 12/18/18 2053   11/27/2018 2000  remdesivir 200 mg in sodium chloride 0.9 % 250 mL IVPB     200 mg 500 mL/hr over 30 Minutes Intravenous Once 12/03/2018 1845 11/23/2018 2116       Subjective/Interval History: Patient intubated and on mechanical ventilation.  Appears to be uncomfortable.   Assessment/Plan:  Acute Hypoxic Resp. Failure due to Acute Covid 19 Viral Illness/ARDS  Vent Mode: PCV FiO2 (%):  [30 %-60 %] 60 % Set Rate:  [28 bmp] 28 bmp PEEP:  [8 cmH20] 8 cmH20 Plateau Pressure:  [22 cmH20-28 cmH20] 24 cmH20     Component Value Date/Time   PHART 7.439 12/21/2018 0452   PCO2ART 38.3 12/21/2018 0452   PO2ART 61.0 (L) 12/21/2018 0452   HCO3 25.9 12/21/2018 0452   TCO2 27 12/21/2018 0452   ACIDBASEDEF 5.0 (H) 12/15/2018 1501   O2SAT 92.0 12/21/2018 0452    COVID-19 Labs  Recent Labs    12/21/18 0450 12/22/18 0510 12/23/18 0607 12/23/18 0612  DDIMER 6.80* 6.40*  --  3.29*  FERRITIN 146 163 27  --   CRP <0.8 <0.8 <0.8  --      Fever: No fever in the last 24 hours Oxygen requirements: On mechanical ventilation.  60% FiO2.  Saturating in the early 90s. Antibiotics: Linezolid and cefepime were discontinued yesterday. Remdesivir: Completed course of Remdesivir Steroids: Remains on dexamethasone Diuretics: None yet Actemra: Received Actemra on 6/29  at [facility] Plasma: Received convalescent plasma on 6/30 Vitamin C and Zinc: Continue DVT Prophylaxis: On heparin IV infusion  Patient has completed maximal medical therapy for COVID-19 with the Remdesivir, Actemra, convalescent plasma and remains on steroids.  There was concern for superimposed bacterial pneumonia and the patient was placed on linezolid and cefepime by pulmonology.   Procalcitonin was consistently less than 0.1.  All antibiotics were discontinued on 7/7.  Ventilator management per pulmonology.  Patient has gained quite a bit of weight.  She is in positive fluid balance.  Will order furosemide today.  Acute metabolic encephalopathy Patient does have her eyes open today but she does not follow any commands.  She appears to be in some discomfort probably due to lack of sedation.  Patient to be placed back on fentanyl infusion.  Versed as needed.  CT head done on 7/6 did not show any acute findings.  Continue to monitor.  Steroid-induced hyperglycemia in the setting of known prediabetes HbA1c 6.4.  Monitor CBGs.  Continue Levemir and SSI.  May need to go up on the dose of Levemir.  Essential hypertension Hydralazine as needed.  Monitor blood pressures closely.  Hypernatremia Resolved with free water.  History of paroxysmal atrial fibrillation Noted on telemetry yesterday.  Patient started on IV heparin.  Not show any concerning findings.  Check TSH tomorrow.  Elevated d-dimer Lower extremity Doppler study on 7/6 were negative for DVT.  Elevated d-dimer most likely due to COVID-19.  Chronic kidney disease stage III Renal function close to baseline.  Monitor urine output.  History of coronary artery disease Continue aspirin.  History of chronic diastolic CHF Patient with significant amount of weight gain in the hospital.  We will give her IV Lasix as discussed above.  History of severe persistent asthma No wheezing noted.  Patient on benralizumab as outpatient.  History of dyslipidemia Continue statin.  History of hypothyroidism Continue levothyroxine.  History of GERD Continue PPI  History of anxiety and depression Holding Seroquel.  Positive urine cultures Urine culture grew staph epidermidis.  Blood cultures negative.  Patient's procalcitonin level was less than 0.1.  Patient was given cefepime and linezolid for about 3 days.  Will not  give her any additional antibiotics at this point in time.  Nutrition Continue tube feedings.   DVT Prophylaxis: On IV heparin PUD Prophylaxis: Protonix Code Status: DNR Family Communication: Discussed with patient's son today. Disposition Plan: Remain in ICU   Medications:  Scheduled:  sodium chloride   Intravenous Once   aspirin  81 mg Per Tube Daily   chlorhexidine  15 mL Mouth/Throat BID   Chlorhexidine Gluconate Cloth  6 each Topical Daily   dexamethasone (DECADRON) injection  6 mg Intravenous Q12H   free water  350 mL Per Tube Q4H   insulin aspart  0-15 Units Subcutaneous Q4H   insulin detemir  20 Units Subcutaneous Daily   levothyroxine  50 mcg Intravenous Daily   mouth rinse  15 mL Mouth Rinse 10 times per day   pantoprazole sodium  40 mg Per Tube Daily   sodium chloride flush  10-40 mL Intracatheter Q12H   vitamin C  500 mg Per Tube Daily   zinc sulfate  220 mg Per Tube Daily   Continuous:  sodium chloride 10 mL/hr at 12/23/18 0600   dexmedetomidine (PRECEDEX) IV infusion     feeding supplement (VITAL HIGH PROTEIN) 50 mL/hr at 12/22/18 2300   fentaNYL infusion INTRAVENOUS 100 mcg/hr (12/23/18 1143)  heparin 500 Units/hr (12/23/18 1159)   midazolam Stopped (12/21/18 0912)   XHB:ZJIRCVELFYBOF, fentaNYL (SUBLIMAZE) injection, hydrALAZINE, ipratropium-albuterol, midazolam, nitroGLYCERIN, [DISCONTINUED] ondansetron **OR** ondansetron (ZOFRAN) IV, polyethylene glycol   Objective:  Vital Signs  Vitals:   12/23/18 0500 12/23/18 0600 12/23/18 0752 12/23/18 0800  BP: (!) 154/73 140/63 122/70 (!) 145/72  Pulse: 92 94 85 69  Resp:   (!) 28   Temp:    (!) 97 F (36.1 C)  TempSrc:    Oral  SpO2: 92% (!) 88% 92% 90%  Weight: 98.5 kg     Height:        Intake/Output Summary (Last 24 hours) at 12/23/2018 0846 Last data filed at 12/23/2018 0800 Gross per 24 hour  Intake 1807.72 ml  Output 1555 ml  Net 252.72 ml   Filed Weights   12/21/18 0500  12/22/18 0500 12/23/18 0500  Weight: 96.9 kg 98.4 kg 98.5 kg    General appearance: Patient with eyes open.  Does not really follow any commands.  Intubated Resp: Tachypneic.  Crackles bilaterally.  No wheezing or rhonchi.  Coarse breath sounds. Cardio: S1-S2 is noted to be tachycardic regular.  No S3-S4 GI: Abdomen is soft.  Nontender nondistended.  Bowel sounds are present normal.  No masses organomegaly Extremities: Minimal edema bilateral lower extremities Neurologic: Noted to be awake with eyes open but does not follow commands.  Noted to be moving her extremities.   Lab Results:  Data Reviewed: I have personally reviewed following labs and imaging studies  CBC: Recent Labs  Lab 12/17/18 0455 12/18/18 0500  12/19/18 0500 12/20/18 0405 12/21/18 0007 12/21/18 0450 12/21/18 0452 12/22/18 0510 12/23/18 0612  WBC 6.4 6.3  --  7.8 6.7  --  7.2  --  9.1 9.5  NEUTROABS 5.3 5.2  --  6.7  --   --   --   --   --   --   HGB 11.2* 12.8   < > 11.6* 11.8* 10.5* 11.1* 10.5* 11.8* 11.9*  HCT 34.3* 39.5   < > 38.3 37.9 31.0* 35.8* 31.0* 37.7 37.6  MCV 95.3 94.3  --  98.5 100.0  --  99.7  --  99.5 98.2  PLT 220 238  --  230 226  --  200  --  198 176   < > = values in this interval not displayed.    Basic Metabolic Panel: Recent Labs  Lab 12/18/18 0500  12/19/18 0500 12/20/18 0405 12/21/18 0007 12/21/18 0450 12/21/18 0452 12/22/18 0510 12/23/18 0612  NA 143   < > 146* 151* 146* 148* 146* 144 142  K 3.6   < > 4.1 3.6 4.1 3.9 3.9 3.6 4.3  CL 105  --  111 112*  --  112*  --  111 109  CO2 28  --  24 29  --  26  --  26 24  GLUCOSE 234*  --  210* 144*  --  240*  --  156* 213*  BUN 67*  --  84* 99*  --  79*  --  66* 65*  CREATININE 1.24*  --  1.44* 1.52*  --  1.31*  --  1.07* 1.03*  CALCIUM 7.7*  --  7.6* 8.0*  --  8.2*  --  7.9* 8.1*  MG 1.9  --  2.2 2.3  --  2.2  --  2.3  --    < > = values in this interval not displayed.    GFR: Estimated Creatinine Clearance:  46.9 mL/min  (A) (by C-G formula based on SCr of 1.03 mg/dL (H)).  Liver Function Tests: Recent Labs  Lab 12/19/18 0500 12/20/18 0405 12/21/18 0450 12/22/18 0510 12/23/18 0612  AST 29 22 26 27 30   ALT 35 30 23 19 23   ALKPHOS 92 76 82 78 84  BILITOT 0.2* 0.1* 0.2* 0.3 0.3  PROT 4.7* 4.5* 4.5* 4.3* 4.6*  ALBUMIN 2.3* 2.2* 2.1* 2.0* 2.0*    CBG: Recent Labs  Lab 12/22/18 1555 12/22/18 2004 12/22/18 2340 12/23/18 0326 12/23/18 0806  GLUCAP 247* 226* 190* 215* 203*    Anemia Panel: Recent Labs    12/22/18 0510 12/23/18 0607  FERRITIN 163 27    Recent Results (from the past 240 hour(s))  MRSA PCR Screening     Status: None   Collection Time: 11/20/2018  5:57 PM   Specimen: Nasal Mucosa; Nasopharyngeal  Result Value Ref Range Status   MRSA by PCR NEGATIVE NEGATIVE Final    Comment:        The GeneXpert MRSA Assay (FDA approved for NASAL specimens only), is one component of a comprehensive MRSA colonization surveillance program. It is not intended to diagnose MRSA infection nor to guide or monitor treatment for MRSA infections. Performed at [facility], [place] [address], [place], [place] [address]   Culture, respiratory (non-expectorated)     Status: None (Preliminary result)   Collection Time: 12/20/18 12:03 PM   Specimen: Tracheal Aspirate; Respiratory  Result Value Ref Range Status   Specimen Description   Final    TRACHEAL ASPIRATE Performed at [facility] [address], [place], [place] [address]    Special Requests   Final    Immunocompromised Performed at [facility], [place] [address] [place], [place] [address]    Gram Stain   Final    FEW WBC PRESENT, PREDOMINANTLY PMN RARE SQUAMOUS EPITHELIAL CELLS PRESENT MODERATE GRAM POSITIVE COCCI IN CLUSTERS RARE GRAM POSITIVE RODS RARE BUDDING YEAST SEEN    Culture   Final    FEW Consistent with normal respiratory flora. Performed at [facility],  [place] [address], [place], [place] [address]    Report Status PENDING  Incomplete  Culture, Urine     Status: Abnormal   Collection Time: 12/20/18 12:03 PM   Specimen: Urine, Clean Catch  Result Value Ref Range Status   Specimen Description   Final    URINE, CLEAN CATCH Performed at [facility], [place] [address], [place], [place] [address]    Special Requests   Final    Immunocompromised Performed at [facility], [place] [address], [place], [place] [address]    Culture >=100,000 COLONIES/mL STAPHYLOCOCCUS EPIDERMIDIS (A)  Final   Report Status 12/22/2018 FINAL  Final   Organism ID, Bacteria STAPHYLOCOCCUS EPIDERMIDIS (A)  Final      Susceptibility   Staphylococcus epidermidis - MIC*    CIPROFLOXACIN >=8 RESISTANT Resistant     GENTAMICIN <=0.5 SENSITIVE Sensitive     NITROFURANTOIN <=16 SENSITIVE Sensitive     OXACILLIN >=4 RESISTANT Resistant     TETRACYCLINE <=1 SENSITIVE Sensitive     VANCOMYCIN 2 SENSITIVE Sensitive     TRIMETH/SULFA <=10 SENSITIVE Sensitive     CLINDAMYCIN <=0.25 SENSITIVE Sensitive     RIFAMPIN <=0.5 SENSITIVE Sensitive     Inducible Clindamycin NEGATIVE Sensitive     * >=100,000 COLONIES/mL STAPHYLOCOCCUS EPIDERMIDIS  Culture, blood (Routine X 2) w Reflex to ID Panel  Status: None (Preliminary result)   Collection Time: 12/20/18 12:40 PM   Specimen: BLOOD  Result Value Ref Range Status   Specimen Description   Final    BLOOD LEFT ANTECUBITAL Performed at [facility] [address] [place], [place] [address]    Special Requests   Final    BOTTLES DRAWN AEROBIC ONLY Blood Culture adequate volume Performed at [facility] [address], [place], [place] [address]    Culture   Final    NO GROWTH 2 DAYS Performed at [facility] [address] [place], [place] [address]    Report Status PENDING  Incomplete  Culture, blood (Routine X 2) w Reflex to ID Panel     Status:  None (Preliminary result)   Collection Time: 12/20/18 12:45 PM   Specimen: BLOOD  Result Value Ref Range Status   Specimen Description   Final    BLOOD LEFT HAND Performed at [facility] [address], [place], [place] [address]    Special Requests   Final    BOTTLES DRAWN AEROBIC ONLY Blood Culture adequate volume Performed at [facility] [address], [place], [place] [address]    Culture   Final    NO GROWTH 2 DAYS Performed at [facility] [address], [place],  [address]    Report Status PENDING  Incomplete      Radiology Studies: Ct Head Wo Contrast  Result Date: 12/21/2018 CLINICAL DATA:  Unresponsive, altered level of consciousness unexplained, history of coronary artery disease post MI, asthma, CHF, hypertension, former smoker EXAM: CT HEAD WITHOUT CONTRAST TECHNIQUE: Contiguous axial images were obtained from the base of the skull through the vertex without intravenous contrast. Sagittal and coronal MPR images reconstructed from axial data set. COMPARISON:  None FINDINGS: Brain: Generalized atrophy. Cavum septum pellucidum and vergae. No midline shift or mass effect. Small vessel chronic ischemic changes of deep cerebral white matter. 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 Streak artifacts from skull base. No definite intracranial hemorrhage, mass lesion or evidence of acute infarction. No extra-axial fluid collections. Vascular: Atherosclerotic calcifications of internal carotid arteries at skull base Skull: Intact Sinuses/Orbits: Small amount of fluid dependently in RIGHT sphenoid sinus. Remaining sinuses clear. Orbits clear. Other: N/A IMPRESSION: Atrophy with small vessel chronic ischemic changes of deep cerebral white matter. 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 No acute intracranial abnormalities. Electronically Signed   By: Lavonia Dana M.D.   On: 12/21/2018 16:49   Dg Chest Port 1 View  Result Date:  12/23/2018 CLINICAL DATA:  Acute respiratory failure with hypoxemia. Endotracheal tube present. EXAM: PORTABLE CHEST 1 VIEW COMPARISON:  12/22/2018 and 12/20/2018 FINDINGS: Endotracheal tube tip is 3.9 cm above the carina. Feeding tube tip appears to be in the duodenal bulb. PICC tip is in the superior vena cava at just below the level of the carina. Diffuse hazy bilateral pulmonary infiltrates are essentially unchanged. No effusions. Heart size and vascularity are normal. No acute bone abnormality. IMPRESSION: 1. No change in the bilateral pulmonary infiltrates. 2. Endotracheal tube tip is 3.9 cm above the carina. Electronically Signed   By: Lorriane Shire M.D.   On: 12/23/2018 07:02   Dg Chest Port 1 View  Result Date: 12/22/2018 CLINICAL DATA:  Endotracheal tube. EXAM: PORTABLE CHEST 1 VIEW COMPARISON:  12/20/2018. FINDINGS: Endotracheal tube, feeding tube, right PICC line stable position. Heart size stable. Diffuse prominent bilateral pulmonary interstitial prominence again noted. Low lung  volumes with progressive atelectasis/infiltrates both lung bases. No prominent pleural effusion. No pneumothorax. IMPRESSION: 1.  Lines and tubes in stable position. 2. Diffuse prominent bilateral pulmonary interstitial prominence again noted. Low lung volumes with progressive atelectasis/infiltrates both lung bases. Electronically Signed   By: Marcello Moores  Register   On: 12/22/2018 06:44   Vas [place] Lower Extremity Venous (dvt)  Result Date: 12/21/2018  Lower Venous Study Indications: Swelling, and hypoxic-intubated and elevated d-dimer. and positive for Covid-19.  Comparison Study: No prior. Performing Technologist: Oda Cogan RDMS, RVT  Examination Guidelines: A complete evaluation includes B-mode imaging, spectral Doppler, color Doppler, and power Doppler as needed of all accessible portions of each vessel. Bilateral testing is considered an integral part of a complete examination. Limited examinations for reoccurring  indications may be performed as noted.  +---------+---------------+---------+-----------+----------+-------+  RIGHT     Compressibility Phasicity Spontaneity Properties Summary  +---------+---------------+---------+-----------+----------+-------+  CFV       Full            Yes       Yes                             +---------+---------------+---------+-----------+----------+-------+  SFJ       Full                                                      +---------+---------------+---------+-----------+----------+-------+  FV Prox   Full                                                      +---------+---------------+---------+-----------+----------+-------+  FV Mid    Full                                                      +---------+---------------+---------+-----------+----------+-------+  FV Distal Full                                                      +---------+---------------+---------+-----------+----------+-------+  PFV       Full                                                      +---------+---------------+---------+-----------+----------+-------+  POP       Full            Yes       Yes                             +---------+---------------+---------+-----------+----------+-------+  PTV       Full                                                      +---------+---------------+---------+-----------+----------+-------+  PERO      Full                                                      +---------+---------------+---------+-----------+----------+-------+   +---------+---------------+---------+-----------+----------+-------+  LEFT      Compressibility Phasicity Spontaneity Properties Summary  +---------+---------------+---------+-----------+----------+-------+  CFV       Full            Yes       Yes                             +---------+---------------+---------+-----------+----------+-------+  SFJ       Full                                                       +---------+---------------+---------+-----------+----------+-------+  FV Prox   Full                                                      +---------+---------------+---------+-----------+----------+-------+  FV Mid    Full                                                      +---------+---------------+---------+-----------+----------+-------+  FV Distal Full                                                      +---------+---------------+---------+-----------+----------+-------+  PFV       Full                                                      +---------+---------------+---------+-----------+----------+-------+  POP       Full            Yes       Yes                             +---------+---------------+---------+-----------+----------+-------+  PTV       Full                                                      +---------+---------------+---------+-----------+----------+-------+  PERO      Full                                                      +---------+---------------+---------+-----------+----------+-------+  Summary: Right: There is no evidence of deep vein thrombosis in the lower extremity. Left: There is no evidence of deep vein thrombosis in the lower extremity.  *See table(s) above for measurements and observations. Electronically signed by Ruta Hinds MD on 12/21/2018 at 8:37:01 PM.    Final        LOS: 9 days   [facility] Pager on [email or web address]  12/23/2018, 8:46 AM

## 2018-12-23 NOTE — Progress Notes (Signed)
spoke with pt family regarding update status. Will facilitate a face time visit at approx 4pm per family request.

## 2018-12-23 NOTE — Progress Notes (Signed)
[place] for heparin Indication: atrial fibrillation  Allergies  Allergen Reactions  . Ace Inhibitors Other (See Comments) and Cough    CHEST PAIN  . Other     Patient reports receiving blood after a miscarriage "years ago". She states she broke out from receiving this blood. Has not received any since  . Codeine Nausea And Vomiting  . Levaquin [Levofloxacin] Nausea And Vomiting    Patient Measurements: Height: 4\' 10"  (147.3 cm) Weight: 217 lb 2.5 oz (98.5 kg) IBW/kg (Calculated) : 40.9 Heparin Dosing Weight: 64 kg   Vital Signs: Temp: 98.4 F (36.9 C) (07/08 1600) Temp Source: Oral (07/08 0800) BP: 99/59 (07/08 1800) Pulse Rate: 89 (07/08 1800)  Labs: Recent Labs    12/21/18 0450 12/21/18 0452 12/22/18 0510 12/22/18 2145 12/23/18 0612 12/23/18 0745 12/23/18 1820  HGB 11.1* 10.5* 11.8*  --  11.9*  --   --   HCT 35.8* 31.0* 37.7  --  37.6  --   --   PLT 200  --  198  --  176  --   --   HEPARINUNFRC  --   --   --  1.46*  --  1.48* 0.60  CREATININE 1.31*  --  1.07*  --  1.03*  --   --     Estimated Creatinine Clearance: 46.9 mL/min (A) (by C-G formula based on SCr of 1.03 mg/dL (H)).   Medical History: Past Medical History:  Diagnosis Date  . 2-vessel coronary artery disease   . Adult hypothyroidism   . Allergic rhinitis 09/12/2016  . Anemia of chronic disease   . Arthritis   . Asthma   . CAD in native artery 05/02/2015   Overview:   S/P PCI and stent x 2. Last cardiac cath August 2010 with mild nonobstructive CAD and normal LV function PCI and stent of proximal [facility] 1999 Cath Nov 2016:Angiographic findings Cardiac Arteries and Lesion Findings LMCA: Normal. LAD: Normal. LCx: Normal. RCA: Abnormal. Lesion on R PDA: Ostial.35% stenosis 5 mm length . Pre procedure TIMI III flow was noted. Good run off was present.Bifurcation lesion.  Cath 05/08/15:Mild non-obstructive coronary artery disease. Normal LV function  . Cancer ([place])  03/31/2017  . Chronic diastolic CHF (congestive heart failure) ([place])   . Chronic GERD   . Chronic kidney disease, stage 3 (moderate) ([place])    patient denies (listed in 07/20/16 PCP notes-Dr. Nelda Bucks)  . CKD (chronic kidney disease) 06/05/2015  . Colon polyp   . Cough 09/12/2016  . Depression    since hysterectomy   . Dyspnea 09/12/2016  . Hallucinations 08/17/2013  . Heart attack ([place])   . Hyperlipidemia LDL goal <70   . Hypertension, essential, benign   . Obstructive sleep apnea 09/12/2016  . Polio   . PONV (postoperative nausea and vomiting)   . PONV (postoperative nausea and vomiting)   . 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 Sleep apnea    wears CPAP  . Spondylolisthesis of lumbosacral region 11/07/2016  . Stroke [facility])    "i had a mini stroke I didn't even know I had it" found on MRI  . Vaginal atrophy 01/30/2016  . Vitamin D deficiency   . Wheezing 09/12/2016    Medications:  Facility-Administered Medications Prior to Admission  Medication Dose Route Frequency Provider Last Rate Last Dose  . Benralizumab SOSY 30 mg  30 mg Subcutaneous Q8 Weeks Jiles Prows, MD   30 mg at 10/22/18 1120   Medications  Prior to Admission  Medication Sig Dispense Refill Last Dose  . cefUROXime (CEFTIN) 250 MG tablet Take 250 mg by mouth 2 (two) times a day. Take 250mg  by mouth twice daily for seven (7) days     . levothyroxine (SYNTHROID) 100 MCG tablet    unknown  . methylPREDNISolone (MEDROL DOSEPAK) 4 MG TBPK tablet Take 1-6 tablets by mouth See admin instructions. Take 6 tabs by mouth the first day, then decrease dosage by 1 tablet each day until complete.   unknown  . montelukast (SINGULAIR) 10 MG tablet TAKE ONE TABLET BY MOUTH AT BEDTIME (Patient taking differently: Take 10 mg by mouth at bedtime. ) 30 tablet 0 unknown  . nitroGLYCERIN (NITROSTAT) 0.4 MG SL tablet      . promethazine (PHENERGAN) 25 MG tablet Take 25 mg by mouth 4 (four) times daily as needed for nausea or  vomiting.    unknown  . TRELEGY ELLIPTA 100-62.5-25 MCG/INH AEPB Inhale 1 Dose into the lungs daily. Rinse, gargle, and spit after use. 53 each 5 unknown    Assessment: 58 YOF diagnosed with COVID. She developed Afib during her ICU stay with CHADSVASc score of 7 (Age, female gender, HTN, stroke, CHF). Pharmacy consulted to start IV heparin. She is currently on Lovenox for VTE prophylaxis.  H/H low stable, Plt wnl   Repeat heparin level remains elevated at 1.48. No issues noted. The lab was a peripheral stick.   PM Update 12/23/18 7:46 PM  - HL now much improved, remains slightly above goal - No bleeding or line issues per RN  Goal of Therapy:  Heparin level 0.3-0.5 units/ml Monitor platelets by anticoagulation protocol: Yes   Plan:  Hold off on further decreasing heparin rate until next level in AM Continue heparin infusion at 500 unis/hr Monitor daily heparin level, CBC, s/s of bleed  Ulice Dash, PharmD, BCPS Clinical Pharmacist

## 2018-12-24 ENCOUNTER — Inpatient Hospital Stay (HOSPITAL_COMMUNITY): Payer: Medicare HMO

## 2018-12-24 LAB — COMPREHENSIVE METABOLIC PANEL
ALT: 24 U/L (ref 0–44)
AST: 27 U/L (ref 15–41)
Albumin: 1.9 g/dL — ABNORMAL LOW (ref 3.5–5.0)
Alkaline Phosphatase: 80 U/L (ref 38–126)
Anion gap: 10 (ref 5–15)
BUN: 65 mg/dL — ABNORMAL HIGH (ref 8–23)
CO2: 26 mmol/L (ref 22–32)
Calcium: 8 mg/dL — ABNORMAL LOW (ref 8.9–10.3)
Chloride: 108 mmol/L (ref 98–111)
Creatinine, Ser: 0.97 mg/dL (ref 0.44–1.00)
GFR calc Af Amer: >60 mL/min (ref >60)
GFR calc non Af Amer: 57 mL/min — ABNORMAL LOW (ref >60)
Glucose, Bld: 196 mg/dL — ABNORMAL HIGH (ref 70–99)
Potassium: 4.4 mmol/L (ref 3.5–5.1)
Sodium: 144 mmol/L (ref 135–145)
Total Bilirubin: 0.3 mg/dL (ref 0.3–1.2)
Total Protein: 4.4 g/dL — ABNORMAL LOW (ref 6.5–8.1)

## 2018-12-24 LAB — POCT I-STAT 7, (LYTES, BLD GAS, ICA,H+H)
Acid-Base Excess: 2 mmol/L (ref 0.0–2.0)
Bicarbonate: 27.5 mmol/L (ref 20.0–28.0)
Calcium, Ion: 1.23 mmol/L (ref 1.15–1.40)
HCT: 35 % — ABNORMAL LOW (ref 36.0–46.0)
Hemoglobin: 11.9 g/dL — ABNORMAL LOW (ref 12.0–15.0)
O2 Saturation: 92 %
Patient temperature: 98.5
Potassium: 4.2 mmol/L (ref 3.5–5.1)
Sodium: 141 mmol/L (ref 135–145)
TCO2: 29 mmol/L (ref 22–32)
pCO2 arterial: 45 mmHg (ref 32.0–48.0)
pH, Arterial: 7.394 (ref 7.350–7.450)
pO2, Arterial: 64 mmHg — ABNORMAL LOW (ref 83.0–108.0)

## 2018-12-24 LAB — T4, FREE: Free T4: 1.08 ng/dL (ref 0.61–1.12)

## 2018-12-24 LAB — CBC
HCT: 35.6 % — ABNORMAL LOW (ref 36.0–46.0)
Hemoglobin: 11.1 g/dL — ABNORMAL LOW (ref 12.0–15.0)
MCH: 31.2 pg (ref 26.0–34.0)
MCHC: 31.2 g/dL (ref 30.0–36.0)
MCV: 100 fL (ref 80.0–100.0)
Platelets: 154 10*3/uL (ref 150–400)
RBC: 3.56 MIL/uL — ABNORMAL LOW (ref 3.87–5.11)
RDW: 14.6 % (ref 11.5–15.5)
WBC: 9.8 10*3/uL (ref 4.0–10.5)
nRBC: 0.2 % (ref 0.0–0.2)

## 2018-12-24 LAB — TSH: TSH: 0.61 u[IU]/mL (ref 0.350–4.500)

## 2018-12-24 LAB — GLUCOSE, CAPILLARY
Glucose-Capillary: 134 mg/dL — ABNORMAL HIGH (ref 70–99)
Glucose-Capillary: 157 mg/dL — ABNORMAL HIGH (ref 70–99)
Glucose-Capillary: 162 mg/dL — ABNORMAL HIGH (ref 70–99)
Glucose-Capillary: 178 mg/dL — ABNORMAL HIGH (ref 70–99)
Glucose-Capillary: 185 mg/dL — ABNORMAL HIGH (ref 70–99)
Glucose-Capillary: 195 mg/dL — ABNORMAL HIGH (ref 70–99)
Glucose-Capillary: 208 mg/dL — ABNORMAL HIGH (ref 70–99)

## 2018-12-24 LAB — D-DIMER, QUANTITATIVE: D-Dimer, Quant: 2.99 ug/mL-FEU — ABNORMAL HIGH (ref 0.00–0.50)

## 2018-12-24 LAB — HEPARIN LEVEL (UNFRACTIONATED)
Heparin Unfractionated: 0.34 IU/mL (ref 0.30–0.70)
Heparin Unfractionated: 0.4 IU/mL (ref 0.30–0.70)

## 2018-12-24 MED ORDER — SODIUM CHLORIDE 0.9 % IV SOLN
1.0000 mg/h | INTRAVENOUS | Status: DC
  Administered 2018-12-24 – 2018-12-27 (×4): 2 mg/h via INTRAVENOUS
  Filled 2018-12-24 (×8): qty 5

## 2018-12-24 MED ORDER — HYDROMORPHONE BOLUS VIA INFUSION
0.5000 mg | INTRAVENOUS | Status: DC | PRN
  Administered 2018-12-25: 1 mg via INTRAVENOUS
  Administered 2018-12-28: 0.5 mg via INTRAVENOUS
  Filled 2018-12-24: qty 1

## 2018-12-24 MED ORDER — ATROPINE SULFATE 1 MG/ML IJ SOLN
0.5000 mg | Freq: Once | INTRAMUSCULAR | Status: DC | PRN
  Filled 2018-12-24: qty 0.5

## 2018-12-24 MED ORDER — QUETIAPINE FUMARATE 25 MG PO TABS: 25.0000 mg | ORAL_TABLET | Freq: Two times a day (BID) | ORAL | Status: DC

## 2018-12-24 MED ORDER — ATROPINE SULFATE 1 MG/10ML IJ SOSY
PREFILLED_SYRINGE | INTRAMUSCULAR | Status: AC
  Filled 2018-12-24: qty 10

## 2018-12-24 MED ORDER — ATROPINE SULFATE 1 MG/10ML IJ SOSY
PREFILLED_SYRINGE | INTRAMUSCULAR | Status: AC
  Administered 2018-12-24: 16:00:00
  Filled 2018-12-24: qty 10

## 2018-12-24 MED ORDER — ATROPINE SULFATE 1 MG/10ML IJ SOSY
PREFILLED_SYRINGE | INTRAMUSCULAR | Status: AC
  Administered 2018-12-24: 1 mg
  Filled 2018-12-24: qty 10

## 2018-12-24 MED ORDER — QUETIAPINE FUMARATE 25 MG PO TABS
25.0000 mg | ORAL_TABLET | Freq: Two times a day (BID) | ORAL | Status: DC
  Administered 2018-12-24 – 2018-12-27 (×7): 25 mg
  Filled 2018-12-24 (×7): qty 1

## 2018-12-24 MED ORDER — FUROSEMIDE 10 MG/ML IJ SOLN
40.0000 mg | Freq: Once | INTRAMUSCULAR | Status: AC
  Administered 2018-12-24: 40 mg via INTRAVENOUS

## 2018-12-24 MED ORDER — CLONAZEPAM 1 MG PO TABS
1.0000 mg | ORAL_TABLET | Freq: Two times a day (BID) | ORAL | Status: DC
  Administered 2018-12-24 – 2018-12-27 (×7): 1 mg
  Filled 2018-12-24 (×7): qty 1

## 2018-12-24 NOTE — Progress Notes (Signed)
PROGRESS NOTE  Jody Taylor GTX:[id number] DOB: 25-Oct-1940 DOA: 11/18/2018  PCP: Nicoletta Dress, MD  Brief History/Interval Summary: Patient is a 78 y.o. female with PMHx of HTN, CAD, chronic diastolic heart failure, severe persistent asthma on benralizumab, hypothyroidism, CKD stage III-presented to [facility] with acute respiratory distress-was found to have acute hypoxic respiratory failure secondary to COVID-19 pneumonia and emergently intubated and subsequently transferred to [facility] course complicated by ventilator dyssynchrony requiring prn paralytics, atrial fibrillation-and concern for encephalopathy as she has been at times not responsive even when off sedation.  See below for further details  Reason for Visit: Acute respiratory disease due to COVID-19  Consultants: Pulmonology  Procedures:  Intubation.   Right internal jugular central venous catheter placement 6/29  Transthoracic echocardiogram 1. The left ventricle has normal systolic function with an ejection fraction of 60-65%. The cavity size was normal. There is moderate asymmetric left ventricular hypertrophy. Left ventricular diastolic parameters were normal.  2. The right ventricle has normal systolic function. The cavity was normal. There is no increase in right ventricular wall thickness.  3. No evidence of mitral valve stenosis.  4. No stenosis of the aortic valve.  5. The aortic root and ascending aorta are normal in size and structure.  6. The interatrial septum was not assessed.  Antibiotics: Anti-infectives (From admission, onward)   Start     Dose/Rate Route Frequency Ordered Stop   12/20/18 1230  linezolid (ZYVOX) IVPB 600 mg  Status:  Discontinued     600 mg 300 mL/hr over 60 Minutes Intravenous Every 12 hours 12/20/18 1210 12/21/18 1227   12/20/18 1230  ceFEPIme (MAXIPIME) 2 g in sodium chloride 0.9 % 100 mL IVPB  Status:  Discontinued     2 g 200 mL/hr over 30  Minutes Intravenous Every 12 hours 12/20/18 1229 12/22/18 1113   12/15/18 2000  remdesivir 100 mg in sodium chloride 0.9 % 250 mL IVPB     100 mg 500 mL/hr over 30 Minutes Intravenous Every 24 hours 12/04/2018 1845 12/18/18 2053   12/10/2018 2000  remdesivir 200 mg in sodium chloride 0.9 % 250 mL IVPB     200 mg 500 mL/hr over 30 Minutes Intravenous Once 12/13/2018 1845 11/21/2018 2116       Subjective/Interval History: Patient is intubated and on mechanical ventilation.  Appears to be more comfortable today compared to yesterday.  Not very responsive this morning.   Assessment/Plan:  Acute Hypoxic Resp. Failure due to Acute Covid 19 Viral Illness/ARDS  Vent Mode: PRVC FiO2 (%):  [60 %-80 %] 60 % Set Rate:  [24 bmp-30 bmp] 30 bmp Vt Set:  [400 mL] 400 mL PEEP:  [8 cmH20] 8 cmH20 Plateau Pressure:  [25 cmH20-26 cmH20] 25 cmH20     Component Value Date/Time   PHART 7.439 12/21/2018 0452   PCO2ART 38.3 12/21/2018 0452   PO2ART 61.0 (L) 12/21/2018 0452   HCO3 25.9 12/21/2018 0452   TCO2 27 12/21/2018 0452   ACIDBASEDEF 5.0 (H) 12/15/2018 1501   O2SAT 92.0 12/21/2018 0452    COVID-19 Labs  Recent Labs    12/22/18 0510 12/23/18 0607 12/23/18 0612 12/24/18 0425  DDIMER 6.40*  --  3.29* 2.99*  FERRITIN 163 27  --   --   CRP <0.8 <0.8  --   --      Fever: Has been afebrile the last 24 hours Oxygen requirements: On mechanical ventilation.  60% FiO2.  Saturating in the 90s.  Antibiotics: Linezolid and cefepime were discontinued on 7/7. Remdesivir: Completed course of Remdesivir Steroids: Remains on dexamethasone Diuretics: Received 40 mg of furosemide on 7/8.  Another dose will be given today. Actemra: Received Actemra on 6/29 at [facility] Plasma: Received convalescent plasma on 6/30 Vitamin C and Zinc: Continue DVT Prophylaxis: On heparin IV infusion  Patient's respiratory status remained stable.  She is still on mechanical ventilation.  Pulmonology is  following and managing.  Patient has completed medical treatment for COVID-19.  She has completed Remdesivir course.  She is still remains on dexamethasone.  She has been given Actemra and convalescent plasma.  There was some concern for superimposed bacterial pneumonia and so the patient was on linezolid and cefepime for a few days.  Procalcitonin was consistently less than 0.1 and so these antibiotics were discontinued on 7/7.  Patient was given dose of furosemide yesterday with which she had good urine output.  She will be given another dose today.  CVP is 5.  Chest x-ray done this morning does not show much change compared to previous films.  Continues to show bilateral infiltrates.  Acute metabolic encephalopathy Encephalopathy is most likely due to acute illness.  CT head done on 7/6 did not show any acute findings.  Yesterday she was opening her eyes.  According to nursing staff she does get very agitated even with minimal stimuli.  Continue to monitor for now.    Steroid-induced hyperglycemia in the setting of known prediabetes HbA1c 6.4.  Monitor CBGs.  Continue Levemir and SSI.  No dosage adjustments today.    Essential hypertension Hydralazine as needed.  Monitor blood pressures closely.  Blood pressure is reasonably well controlled.  Hypernatremia Resolved with free water.  History of paroxysmal atrial fibrillation Noted on telemetry on 7/7.  Patient started on IV heparin.  Echocardiogram did not show any concerning findings.  TSH and free T4 are normal.   Elevated d-dimer Lower extremity Doppler study on 7/6 were negative for DVT.  Elevated d-dimer most likely due to COVID-19.  D-dimer has been improving.  Continue to trend.  Chronic kidney disease stage III Renal function close to baseline.  Monitor urine output.  Monitor electrolytes while patient is getting diuretics.  History of coronary artery disease Continue aspirin.  History of chronic diastolic CHF Patient with  significant amount of weight gain in the hospital.  Another dose of Lasix to be given today.  History of severe persistent asthma No wheezing noted.  Patient on benralizumab as outpatient.  History of dyslipidemia Continue statin.  History of hypothyroidism Continue levothyroxine.  TSH and free T4 were normal.  History of GERD Continue PPI  History of anxiety and depression Holding Seroquel.  Positive urine cultures Urine culture grew staph epidermidis.  Blood cultures negative.  Patient's procalcitonin level was less than 0.1.  Patient was given cefepime and linezolid for about 3 days.  Will not give her any additional antibiotics at this point in time.  Nutrition Continue tube feedings.   DVT Prophylaxis: On IV heparin PUD Prophylaxis: Protonix Code Status: DNR Family Communication: Discussed with son yesterday.  Will do so again today. Disposition Plan: Remain in ICU   Medications:  Scheduled: . sodium chloride   Intravenous Once  . aspirin  81 mg Per Tube Daily  . chlorhexidine  15 mL Mouth/Throat BID  . Chlorhexidine Gluconate Cloth  6 each Topical Daily  . dexamethasone (DECADRON) injection  6 mg Intravenous Q12H  . free water  350 mL  Per Tube Q4H  . insulin aspart  0-15 Units Subcutaneous Q4H  . insulin detemir  20 Units Subcutaneous Daily  . levothyroxine  50 mcg Intravenous Daily  . mouth rinse  15 mL Mouth Rinse 10 times per day  . pantoprazole sodium  40 mg Per Tube Daily  . sodium chloride flush  10-40 mL Intracatheter Q12H  . vitamin C  500 mg Per Tube Daily  . zinc sulfate  220 mg Per Tube Daily   Continuous: . sodium chloride 10 mL/hr at 12/24/18 0625  . dexmedetomidine (PRECEDEX) IV infusion    . feeding supplement (VITAL HIGH PROTEIN) 1,000 mL (12/23/18 1600)  . fentaNYL infusion INTRAVENOUS 100 mcg/hr (12/24/18 0900)  . heparin 500 Units/hr (12/24/18 0625)  . midazolam 2 mg/hr (12/24/18 0128)   JSE:[id number], fentaNYL (SUBLIMAZE)  injection, hydrALAZINE, ipratropium-albuterol, midazolam, nitroGLYCERIN, [DISCONTINUED] ondansetron **OR** ondansetron (ZOFRAN) IV, polyethylene glycol   Objective:  Vital Signs  Vitals:   12/24/18 0535 12/24/18 0600 12/24/18 0700 12/24/18 0830  BP: 110/62 (!) 147/82 137/65 137/65  Pulse:  80 73 77  Resp: (!) 30   (!) 30  Temp:      TempSrc:      SpO2: 98% (!) 88% 95% 93%  Weight:      Height:        Intake/Output Summary (Last 24 hours) at 12/24/2018 1138 Last data filed at 12/24/2018 0625 Gross per 24 hour  Intake 2390.04 ml  Output 3370 ml  Net -979.96 ml   Filed Weights   12/22/18 0500 12/23/18 0500 12/24/18 0411  Weight: 98.4 kg 98.5 kg 98.4 kg    General appearance: Intubated and sedated this morning Resp: Coarse breath sounds bilaterally.  More comfortable on mechanical ventilation today.  Crackles bilaterally at the bases.  No wheezing or rhonchi.   Cardio: S1-S2 is normal regular.  No S3-S4.  No rubs murmurs or bruit GI: Abdomen is soft.  Nontender nondistended.  Bowel sounds are present normal.  No masses organomegaly Extremities: Edema noted bilateral lower extremities. Neurologic: Opens her eyes occasionally.  Does not follow commands.  Was noted to be moving her extremities yesterday.   Lab Results:  Data Reviewed: I have personally reviewed following labs and imaging studies  CBC: Recent Labs  Lab 12/18/18 0500  12/19/18 0500 12/20/18 0405  12/21/18 0450 12/21/18 0452 12/22/18 0510 12/23/18 0612 12/24/18 0425  WBC 6.3  --  7.8 6.7  --  7.2  --  9.1 9.5 9.8  NEUTROABS 5.2  --  6.7  --   --   --   --   --   --   --   HGB 12.8   < > 11.6* 11.8*   < > 11.1* 10.5* 11.8* 11.9* 11.1*  HCT 39.5   < > 38.3 37.9   < > 35.8* 31.0* 37.7 37.6 35.6*  MCV 94.3  --  98.5 100.0  --  99.7  --  99.5 98.2 100.0  PLT 238  --  230 226  --  200  --  198 176 154   < > = values in this interval not displayed.    Basic Metabolic Panel: Recent Labs  Lab 12/18/18 0500   12/19/18 0500 12/20/18 0405  12/21/18 0450 12/21/18 0452 12/22/18 0510 12/23/18 0612 12/24/18 0425  NA 143   < > 146* 151*   < > 148* 146* 144 142 144  K 3.6   < > 4.1 3.6   < > 3.9 3.9 3.6  4.3 4.4  CL 105  --  111 112*  --  112*  --  111 109 108  CO2 28  --  24 29  --  26  --  26 24 26   GLUCOSE 234*  --  210* 144*  --  240*  --  156* 213* 196*  BUN 67*  --  84* 99*  --  79*  --  66* 65* 65*  CREATININE 1.24*  --  1.44* 1.52*  --  1.31*  --  1.07* 1.03* 0.97  CALCIUM 7.7*  --  7.6* 8.0*  --  8.2*  --  7.9* 8.1* 8.0*  MG 1.9  --  2.2 2.3  --  2.2  --  2.3  --   --    < > = values in this interval not displayed.    GFR: Estimated Creatinine Clearance: 49.8 mL/min (by C-G formula based on SCr of 0.97 mg/dL).  Liver Function Tests: Recent Labs  Lab 12/20/18 0405 12/21/18 0450 12/22/18 0510 12/23/18 0612 12/24/18 0425  AST 22 26 27 30 27   ALT 30 23 19 23 24   ALKPHOS 76 82 78 84 80  BILITOT 0.1* 0.2* 0.3 0.3 0.3  PROT 4.5* 4.5* 4.3* 4.6* 4.4*  ALBUMIN 2.2* 2.1* 2.0* 2.0* 1.9*    CBG: Recent Labs  Lab 12/23/18 1556 12/23/18 1954 12/23/18 2341 12/24/18 0345 12/24/18 0810  GLUCAP 237* 219* 195* 208* 185*    Anemia Panel: Recent Labs    12/22/18 0510 12/23/18 0607  FERRITIN 163 27    Recent Results (from the past 240 hour(s))  MRSA PCR Screening     Status: None   Collection Time: 11/23/2018  5:57 PM   Specimen: Nasal Mucosa; Nasopharyngeal  Result Value Ref Range Status   MRSA by PCR NEGATIVE NEGATIVE Final    Comment:        The GeneXpert MRSA Assay (FDA approved for NASAL specimens only), is one component of a comprehensive MRSA colonization surveillance program. It is not intended to diagnose MRSA infection nor to guide or monitor treatment for MRSA infections. Performed at [facility], [place] [address] [place], [place] [address]   Culture, respiratory (non-expectorated)     Status: None   Collection Time: 12/20/18 12:03 PM    Specimen: Tracheal Aspirate; Respiratory  Result Value Ref Range Status   Specimen Description   Final    TRACHEAL ASPIRATE Performed at [place] [address] [place], [place] [address]    Special Requests   Final    Immunocompromised Performed at [facility], [place] [address], [place], [place] [address]    Gram Stain   Final    FEW WBC PRESENT, PREDOMINANTLY PMN RARE SQUAMOUS EPITHELIAL CELLS PRESENT MODERATE GRAM POSITIVE COCCI IN CLUSTERS RARE GRAM POSITIVE RODS RARE BUDDING YEAST SEEN    Culture   Final    FEW Consistent with normal respiratory flora. Performed at [facility], [place] [address] [place], [place] [address]    Report Status 12/23/2018 FINAL  Final  Culture, Urine     Status: Abnormal   Collection Time: 12/20/18 12:03 PM   Specimen: Urine, Clean Catch  Result Value Ref Range Status   Specimen Description   Final    URINE, CLEAN CATCH Performed at [facility], [place] [address], [place],  [address]    Special Requests   Final    Immunocompromised Performed at [facility], [address] [place], [place] [address]    Culture >=100,000 COLONIES/mL STAPHYLOCOCCUS EPIDERMIDIS (A)  Final   Report Status 12/22/2018 FINAL  Final   Organism ID, Bacteria STAPHYLOCOCCUS EPIDERMIDIS (A)  Final      Susceptibility   Staphylococcus epidermidis - MIC*    CIPROFLOXACIN >=8 RESISTANT Resistant     GENTAMICIN <=0.5 SENSITIVE Sensitive     NITROFURANTOIN <=16 SENSITIVE Sensitive     OXACILLIN >=4 RESISTANT Resistant     TETRACYCLINE <=1 SENSITIVE Sensitive     VANCOMYCIN 2 SENSITIVE Sensitive     TRIMETH/SULFA <=10 SENSITIVE Sensitive     CLINDAMYCIN <=0.25 SENSITIVE Sensitive     RIFAMPIN <=0.5 SENSITIVE Sensitive     Inducible Clindamycin NEGATIVE Sensitive     * >=100,000 COLONIES/mL STAPHYLOCOCCUS EPIDERMIDIS  Culture, blood (Routine X 2) w Reflex to ID Panel     Status:  None (Preliminary result)   Collection Time: 12/20/18 12:40 PM   Specimen: BLOOD  Result Value Ref Range Status   Specimen Description   Final    BLOOD LEFT ANTECUBITAL Performed at [place] [address], [place], [place] [address]    Special Requests   Final    BOTTLES DRAWN AEROBIC ONLY Blood Culture adequate volume Performed at [place] [address], [place], [place] [address]    Culture   Final    NO GROWTH 4 DAYS Performed at [facility], [place] [address] [place], [place] [address]    Report Status PENDING  Incomplete  Culture, blood (Routine X 2) w Reflex to ID Panel     Status: None (Preliminary result)   Collection Time: 12/20/18 12:45 PM   Specimen: BLOOD  Result Value Ref Range Status   Specimen Description   Final    BLOOD LEFT HAND Performed at [place] [address] [place], [place] [address]    Special Requests   Final    BOTTLES DRAWN AEROBIC ONLY Blood Culture adequate volume Performed at [place] [address], [place], [place] [address]    Culture   Final    NO GROWTH 4 DAYS Performed at [facility], [place] [address], [place], [place] [address]    Report Status PENDING  Incomplete      Radiology Studies: Dg Chest Port 1 View  Result Date: 12/24/2018 CLINICAL DATA:  Pneumonia due to COVID-19 virus. EXAM: PORTABLE CHEST 1 VIEW COMPARISON:  Radiograph of December 23, 2018. FINDINGS: The heart size and mediastinal contours are within normal limits. Endotracheal and feeding tubes are unchanged in position. Atherosclerosis of thoracic aorta is noted. No pneumothorax is noted. Stable bilateral lung opacities are noted most consistent with multifocal pneumonia. The visualized skeletal structures are unremarkable. IMPRESSION: Stable support apparatus. Stable bilateral lung opacities are noted most consistent with multifocal pneumonia. Electronically Signed   By:  Marijo Conception M.D.   On: 12/24/2018 10:21   Dg Chest Port 1 View  Result Date: 12/23/2018 CLINICAL DATA:  Acute respiratory failure with hypoxemia. Endotracheal tube present. EXAM: PORTABLE CHEST 1 VIEW COMPARISON:  12/22/2018 and 12/20/2018 FINDINGS: Endotracheal tube tip is 3.9 cm above the carina. Feeding tube tip appears to be in the duodenal bulb. PICC tip is in the superior vena cava at just below the level of the carina. Diffuse hazy bilateral pulmonary infiltrates are essentially unchanged. No effusions. Heart size and vascularity are normal. No acute bone abnormality. IMPRESSION: 1. No change in  the bilateral pulmonary infiltrates. 2. Endotracheal tube tip is 3.9 cm above the carina. Electronically Signed   By: Lorriane Shire M.D.   On: 12/23/2018 07:02       LOS: 10 days   [facility] Pager on [email or web address]  12/24/2018, 11:38 AM

## 2018-12-24 NOTE — Progress Notes (Signed)
Margaretha Sheffield (daughter) called for an update. Answered all questions that she had. Appreciative of the care and the update. Will continue to closely monitor patient.

## 2018-12-24 NOTE — Progress Notes (Addendum)
[facility] P[facility]M Afternoon, evening rounds  On rounds this evening she had an episode of sustained bradycardia (HR 26) with dyssynchrony.  BP was OK during the episode.   Also had some mild hemoptysis. I suctioned blood from trachea, gave sedation bolus with fentanyl/versed. Unclear why she has sustained bradycardia with dyssynchrony other than vagal response to air trapping.   Change fentanyl to dilaudid infusion Atropine to bedside Hold heparin overnight with mild hemoptysis  Additional cc time 40 minutes  Roselie Awkward, MD [place] [facility] Pager: [phone] Cell: [phone] If no response, call [phone]

## 2018-12-24 NOTE — Progress Notes (Signed)
NAME:  Jody Taylor, MRN:  [id number], DOB:  1940-10-23, LOS: 19 ADMISSION DATE:  12/09/2018, CONSULTATION DATE:  6/29 REFERRING MD:  Sloan Leiter, CHIEF COMPLAINT:  Dyspnea   Brief History   78 y/o female with a history of diastolic heart failure admitted on June 29 for ARDS in setting of COVID 19 pneumonia.  Required intubation at [facility] emergency room.  Past Medical History  Diastolic heart failure History of stroke (mini stroke) Obstructive sleep apnea on CPAP Hypertension Hyperlipidemia Coronary artery disease, had PCI in 9326 Chronic diastolic heart failure Chronic kidney disease GERD Allergic rhinitis  Significant Hospital Events   June 29 admission July 2 severe vent dyssynchrony July 3 oxygenation worsening overnight, tvol 12cc/kg IB on SIMV, changed to [facility], paralytic used July 4 no acute events July 5 paralytic used July 6 no acute changed, fever, sedation minimized July 7 Afib July 8 severe dyssyncrhony, large tidal volumes, diaphoresis, fentanyl drip added back, low dose versed, changed back to [facility] July 9 more comfortable, stable TVol  Consults:  PCCM  Procedures:  June 29 endotracheal tube> June 29 right internal jugular central venous line>June 29 June 30 PICC >   Significant Diagnostic Tests:  7/6 CT head > hold infarcts 7/7 LE Doppler negative for DVT  Micro Data:  June 26 SARS-COV-2 Positivie July 5 Urine > staph epidermidis  Antimicrobials:  June 29 remdesivir June29Actemra June 29 solumedrol - decadron 7/1 >> June 29 convalescent plasma  ............................. 7/5  zyvox > 7/6 7/5 - cefepime > 7.6  Interim history/subjective:  Dyssynchrony improved, changed to PRVC overnight, couldn't tolerate TVol less than 400cc  Objective   Blood pressure 121/67, pulse 78, temperature 97.9 F (36.6 C), temperature source Axillary, resp. rate (!) 24, height 4\' 10"  (1.473 m), weight 98.4 kg, SpO2 93 %.    Vent Mode: PRVC FiO2 (%):   [60 %-80 %] 60 % Set Rate:  [24 bmp-30 bmp] 24 bmp Vt Set:  [400 mL] 400 mL PEEP:  [8 cmH20] 8 cmH20 Plateau Pressure:  [25 cmH20-26 cmH20] 26 cmH20   Intake/Output Summary (Last 24 hours) at 12/24/2018 1312 Last data filed at 12/24/2018 7124 Gross per 24 hour  Intake 1250.12 ml  Output 3170 ml  Net -1919.88 ml   Filed Weights   12/22/18 0500 12/23/18 0500 12/24/18 0411  Weight: 98.4 kg 98.5 kg 98.4 kg    Examination:  General:  In bed on vent HENT: NCAT ETT in place PULM: CTA B, vent supported breathing CV: RRR, no mgr GI: BS+, soft, nontender MSK: normal bulk and tone Neuro: sedated on vent   December 24, 2018 chest x-ray images independently reviewed showing severe bilateral airspace disease, some cardiomegaly, endotracheal tube in place, PICC line in place  Resolved Hospital Problem list     Assessment & Plan:  ARDS due to COVID 19 pneumonia: Oxygenation improving slowly Continue fentanyl and Versed titrated to an RA SS goal of -2 Continue full mechanical ventilatory support per ARDS protocol, targeting tidal volume 6 to 8 cc/kg ideal body weight, keep driving pressure less than 15, keep plateau pressure less than 30 Agree with more Lasix today Ventilator associated pneumonia prevention  Acute encephalopathy RASS target -2 today Add clonazepam, seroquel Continue fentanyl/versed  AKI : resolved Monitor BMET and UOP Replace electrolytes as needed  Diastolic heart failure Agree with lasix today Telemetry monitoring  Best practice:  Diet: tube feeding Pain/Anxiety/Delirium protocol (if indicated): RA SS goal -2, fentanyl/versed adding oral agents as above VAP  protocol (if indicated): Yes DVT prophylaxis: heparin infusion GI prophylaxis: yes Pantoprazole for stress ulcer prophylaxis Glucose control: SSI Mobility: bed rest Code Status: full Family Communication: per [facility] Disposition: remain in ICU  Labs   CBC: Recent Labs  Lab 12/18/18 0500  12/19/18 0500  12/20/18 0405  12/21/18 0450 12/21/18 0452 12/22/18 0510 12/23/18 0612 12/24/18 0425 12/24/18 1150  WBC 6.3  --  7.8 6.7  --  7.2  --  9.1 9.5 9.8  --   NEUTROABS 5.2  --  6.7  --   --   --   --   --   --   --   --   HGB 12.8   < > 11.6* 11.8*   < > 11.1* 10.5* 11.8* 11.9* 11.1* 11.9*  HCT 39.5   < > 38.3 37.9   < > 35.8* 31.0* 37.7 37.6 35.6* 35.0*  MCV 94.3  --  98.5 100.0  --  99.7  --  99.5 98.2 100.0  --   PLT 238  --  230 226  --  200  --  198 176 154  --    < > = values in this interval not displayed.    Basic Metabolic Panel: Recent Labs  Lab 12/18/18 0500  12/19/18 0500 12/20/18 0405  12/21/18 0450 12/21/18 0452 12/22/18 0510 12/23/18 0612 12/24/18 0425 12/24/18 1150  NA 143   < > 146* 151*   < > 148* 146* 144 142 144 141  K 3.6   < > 4.1 3.6   < > 3.9 3.9 3.6 4.3 4.4 4.2  CL 105  --  111 112*  --  112*  --  111 109 108  --   CO2 28  --  24 29  --  26  --  26 24 26   --   GLUCOSE 234*  --  210* 144*  --  240*  --  156* 213* 196*  --   BUN 67*  --  84* 99*  --  79*  --  66* 65* 65*  --   CREATININE 1.24*  --  1.44* 1.52*  --  1.31*  --  1.07* 1.03* 0.97  --   CALCIUM 7.7*  --  7.6* 8.0*  --  8.2*  --  7.9* 8.1* 8.0*  --   MG 1.9  --  2.2 2.3  --  2.2  --  2.3  --   --   --    < > = values in this interval not displayed.   GFR: Estimated Creatinine Clearance: 49.8 mL/min (by C-G formula based on SCr of 0.97 mg/dL). Recent Labs  Lab 12/20/18 1330 12/21/18 0450 12/22/18 0510 12/23/18 0612 12/24/18 0425  PROCALCITON <0.10 <0.10 <0.10  --   --   WBC  --  7.2 9.1 9.5 9.8  LATICACIDVEN 1.3  --   --   --   --     Liver Function Tests: Recent Labs  Lab 12/20/18 0405 12/21/18 0450 12/22/18 0510 12/23/18 0612 12/24/18 0425  AST 22 26 27 30 27   ALT 30 23 19 23 24   ALKPHOS 76 82 78 84 80  BILITOT 0.1* 0.2* 0.3 0.3 0.3  PROT 4.5* 4.5* 4.3* 4.6* 4.4*  ALBUMIN 2.2* 2.1* 2.0* 2.0* 1.9*   No results for input(s): LIPASE, AMYLASE in the last 168 hours. No  results for input(s): AMMONIA in the last 168 hours.  ABG    Component Value Date/Time   PHART 7.394  12/24/2018 1150   PCO2ART 45.0 12/24/2018 1150   PO2ART 64.0 (L) 12/24/2018 1150   HCO3 27.5 12/24/2018 1150   TCO2 29 12/24/2018 1150   ACIDBASEDEF 5.0 (H) 12/15/2018 1501   O2SAT 92.0 12/24/2018 1150     Coagulation Profile: No results for input(s): INR, PROTIME in the last 168 hours.  Cardiac Enzymes: No results for input(s): CKTOTAL, CKMB, CKMBINDEX, TROPONINI in the last 168 hours.  HbA1C: Hgb A1c MFr Bld  Date/Time Value Ref Range Status  12/20/2018 04:06 AM 6.4 (H) 4.8 - 5.6 % Final    Comment:    (NOTE) Pre diabetes:          5.7%-6.4% Diabetes:              >6.4% Glycemic control for   <7.0% adults with diabetes   11/08/2016 07:24 AM 5.6 4.8 - 5.6 % Final    Comment:    (NOTE)         Pre-diabetes: 5.7 - 6.4         Diabetes: >6.4         Glycemic control for adults with diabetes: <7.0     CBG: Recent Labs  Lab 12/23/18 1954 12/23/18 2341 12/24/18 0345 12/24/18 0810 12/24/18 1227  GLUCAP 219* 195* 208* 185* 178*     Critical care time: 33 minutes     Roselie Awkward, MD [place] [facility] Pager: [phone] Cell: [phone] If no response, call [phone]

## 2018-12-24 NOTE — Progress Notes (Signed)
Spoke with patient daughter today at 88 with update. Scheduled a face time meeting to include other family at 47. This was completed.

## 2018-12-24 NOTE — Progress Notes (Addendum)
[place] for heparin Indication: atrial fibrillation  Allergies  Allergen Reactions  . Ace Inhibitors Other (See Comments) and Cough    CHEST PAIN  . Other     Patient reports receiving blood after a miscarriage "years ago". She states she broke out from receiving this blood. Has not received any since  . Codeine Nausea And Vomiting  . Levaquin [Levofloxacin] Nausea And Vomiting    Patient Measurements: Height: 4\' 10"  (147.3 cm) Weight: 216 lb 14.9 oz (98.4 kg) IBW/kg (Calculated) : 40.9 Heparin Dosing Weight: 64 kg   Vital Signs: Temp: 97.9 F (36.6 C) (07/09 0340) Temp Source: Axillary (07/09 0340) BP: 137/65 (07/09 0700) Pulse Rate: 73 (07/09 0700)  Labs: Recent Labs    12/22/18 0510  12/23/18 0612 12/23/18 0745 12/23/18 1820 12/24/18 0425 12/24/18 0545  HGB 11.8*  --  11.9*  --   --  11.1*  --   HCT 37.7  --  37.6  --   --  35.6*  --   PLT 198  --  176  --   --  154  --   HEPARINUNFRC  --    < >  --  1.48* 0.60  --  0.34  CREATININE 1.07*  --  1.03*  --   --  0.97  --    < > = values in this interval not displayed.    Estimated Creatinine Clearance: 49.8 mL/min (by C-G formula based on SCr of 0.97 mg/dL).   Medical History: Past Medical History:  Diagnosis Date  . 2-vessel coronary artery disease   . Adult hypothyroidism   . Allergic rhinitis 09/12/2016  . Anemia of chronic disease   . Arthritis   . Asthma   . CAD in native artery 05/02/2015   Overview:   S/P PCI and stent x 2. Last cardiac cath August 2010 with mild nonobstructive CAD and normal LV function PCI and stent of proximal [facility] 1999 Cath Nov 2016:Angiographic findings Cardiac Arteries and Lesion Findings LMCA: Normal. LAD: Normal. LCx: Normal. RCA: Abnormal. Lesion on R PDA: Ostial.35% stenosis 5 mm length . Pre procedure TIMI III flow was noted. Good run off was present.Bifurcation lesion.  Cath 05/08/15:Mild non-obstructive coronary artery disease. Normal  LV function  . Cancer ([place]) 03/31/2017  . Chronic diastolic CHF (congestive heart failure) ([place])   . Chronic GERD   . Chronic kidney disease, stage 3 (moderate) ([place])    patient denies (listed in 07/20/16 PCP notes-Dr. Nelda Bucks)  . CKD (chronic kidney disease) 06/05/2015  . Colon polyp   . Cough 09/12/2016  . Depression    since hysterectomy   . Dyspnea 09/12/2016  . Hallucinations 08/17/2013  . Heart attack ([place])   . Hyperlipidemia LDL goal <70   . Hypertension, essential, benign   . Obstructive sleep apnea 09/12/2016  . Polio   . PONV (postoperative nausea and vomiting)   . PONV (postoperative nausea and vomiting)   . 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 Sleep apnea    wears CPAP  . Spondylolisthesis of lumbosacral region 11/07/2016  . Stroke [facility])    "i had a mini stroke I didn't even know I had it" found on MRI  . Vaginal atrophy 01/30/2016  . Vitamin D deficiency   . Wheezing 09/12/2016    Medications:  Facility-Administered Medications Prior to Admission  Medication Dose Route Frequency Provider Last Rate Last Dose  . Benralizumab SOSY 30 mg  30 mg Subcutaneous Q8 Weeks Kozlow,  Donnamarie Poag, MD   30 mg at 10/22/18 1120   Medications Prior to Admission  Medication Sig Dispense Refill Last Dose  . cefUROXime (CEFTIN) 250 MG tablet Take 250 mg by mouth 2 (two) times a day. Take 250mg  by mouth twice daily for seven (7) days     . levothyroxine (SYNTHROID) 100 MCG tablet    unknown  . methylPREDNISolone (MEDROL DOSEPAK) 4 MG TBPK tablet Take 1-6 tablets by mouth See admin instructions. Take 6 tabs by mouth the first day, then decrease dosage by 1 tablet each day until complete.   unknown  . montelukast (SINGULAIR) 10 MG tablet TAKE ONE TABLET BY MOUTH AT BEDTIME (Patient taking differently: Take 10 mg by mouth at bedtime. ) 30 tablet 0 unknown  . nitroGLYCERIN (NITROSTAT) 0.4 MG SL tablet      . promethazine (PHENERGAN) 25 MG tablet Take 25 mg by mouth 4 (four) times  daily as needed for nausea or vomiting.    unknown  . TRELEGY ELLIPTA 100-62.5-25 MCG/INH AEPB Inhale 1 Dose into the lungs daily. Rinse, gargle, and spit after use. 43 each 5 unknown    Assessment: 14 YOF diagnosed with COVID. She developed Afib during her ICU stay with CHADSVASc score of 7 (Age, female gender, HTN, stroke, CHF). Pharmacy consulted to start IV heparin. She is currently on Lovenox for VTE prophylaxis.  H/H low stable, Plt wnl   Repeat HL this AM is therapeutic at 0.34 which is down significantly since yesterday. H/H remains low stable. Plt wnl but slowly trending down but no s/s of overt bleeding   Goal of Therapy:  Heparin level 0.3-0.5 units/ml Monitor platelets by anticoagulation protocol: Yes   Plan:  Continue heparin infusion at 500 unis/hr Will recheck 1400 HL  Monitor daily heparin level, CBC, s/s of bleed  Albertina Parr, PharmD., BCPS Clinical Pharmacist Clinical phone for 12/24/18 until 5pm: [phone]    Addendum:  Repeat HL this afternoon remains therapeutic at 0.4. Will continue IV heparin infusion at 500 units/hr and monitor daily level.   Albertina Parr, PharmD., BCPS Clinical Pharmacist

## 2018-12-25 LAB — BASIC METABOLIC PANEL
Anion gap: 7 (ref 5–15)
BUN: 63 mg/dL — ABNORMAL HIGH (ref 8–23)
CO2: 28 mmol/L (ref 22–32)
Calcium: 8.1 mg/dL — ABNORMAL LOW (ref 8.9–10.3)
Chloride: 106 mmol/L (ref 98–111)
Creatinine, Ser: 0.92 mg/dL (ref 0.44–1.00)
GFR calc Af Amer: >60 mL/min (ref >60)
GFR calc non Af Amer: >60 mL/min (ref >60)
Glucose, Bld: 164 mg/dL — ABNORMAL HIGH (ref 70–99)
Potassium: 4.6 mmol/L (ref 3.5–5.1)
Sodium: 141 mmol/L (ref 135–145)

## 2018-12-25 LAB — CBC
HCT: 36 % (ref 36.0–46.0)
Hemoglobin: 10.8 g/dL — ABNORMAL LOW (ref 12.0–15.0)
MCH: 30.1 pg (ref 26.0–34.0)
MCHC: 30 g/dL (ref 30.0–36.0)
MCV: 100.3 fL — ABNORMAL HIGH (ref 80.0–100.0)
Platelets: 133 10*3/uL — ABNORMAL LOW (ref 150–400)
RBC: 3.59 MIL/uL — ABNORMAL LOW (ref 3.87–5.11)
RDW: 14.5 % (ref 11.5–15.5)
WBC: 11 10*3/uL — ABNORMAL HIGH (ref 4.0–10.5)
nRBC: 0 % (ref 0.0–0.2)

## 2018-12-25 LAB — GLUCOSE, CAPILLARY
Glucose-Capillary: 161 mg/dL — ABNORMAL HIGH (ref 70–99)
Glucose-Capillary: 151 mg/dL — ABNORMAL HIGH (ref 70–99)
Glucose-Capillary: 174 mg/dL — ABNORMAL HIGH (ref 70–99)
Glucose-Capillary: 217 mg/dL — ABNORMAL HIGH (ref 70–99)
Glucose-Capillary: 162 mg/dL — ABNORMAL HIGH (ref 70–99)

## 2018-12-25 LAB — HEPARIN LEVEL (UNFRACTIONATED): Heparin Unfractionated: 0.87 IU/mL — ABNORMAL HIGH (ref 0.30–0.70)

## 2018-12-25 LAB — CULTURE, BLOOD (ROUTINE X 2)
Culture: NO GROWTH
Culture: NO GROWTH
Special Requests: ADEQUATE
Special Requests: ADEQUATE

## 2018-12-25 LAB — D-DIMER, QUANTITATIVE: D-Dimer, Quant: 8.85 ug/mL-FEU — ABNORMAL HIGH (ref 0.00–0.50)

## 2018-12-25 MED ORDER — HEPARIN (PORCINE) 25000 UT/250ML-% IV SOLN
500.0000 [IU]/h | INTRAVENOUS | Status: DC
  Administered 2018-12-25: 500 [IU]/h via INTRAVENOUS
  Filled 2018-12-25: qty 250

## 2018-12-25 MED ORDER — FUROSEMIDE 10 MG/ML IJ SOLN
40.0000 mg | Freq: Once | INTRAMUSCULAR | Status: AC
  Administered 2018-12-25: 12:00:00 40 mg via INTRAVENOUS
  Filled 2018-12-25: qty 4

## 2018-12-25 NOTE — Progress Notes (Signed)
NAME:  Jody Taylor, MRN:  [id number], DOB:  1940/06/23, LOS: 43 ADMISSION DATE:  12/11/2018, CONSULTATION DATE:  6/29 REFERRING MD:  Sloan Leiter, CHIEF COMPLAINT:  Dyspnea   Brief History   78 y/o female with a history of diastolic heart failure admitted on June 29 for ARDS in setting of COVID 19 pneumonia.  Required intubation at [facility] emergency room.  Past Medical History  Diastolic heart failure History of stroke (mini stroke) Obstructive sleep apnea on CPAP Hypertension Hyperlipidemia Coronary artery disease, had PCI in 1540 Chronic diastolic heart failure Chronic kidney disease GERD Allergic rhinitis  Significant Hospital Events   June 29 admission July 2 severe vent dyssynchrony July 3 oxygenation worsening overnight, tvol 12cc/kg IB on SIMV, changed to [facility], paralytic used July 4 no acute events July 5 paralytic used July 6 no acute changed, fever, sedation minimized July 7 Afib July 8 severe dyssyncrhony, large tidal volumes, diaphoresis, fentanyl drip added back, low dose versed, changed back to [facility] July 9 more comfortable, stable TVol, severe ventilator dyssynchrony lead increased intrathoracic pressure, bradycardia requiring atropine emergently.  Change sedation from fentanyl to Dilaudid, improved in a later synchrony  Consults:  PCCM  Procedures:  June 29 endotracheal tube> June 29 right internal jugular central venous line>June 29 June 30 PICC >   Significant Diagnostic Tests:  7/6 CT head > hold infarcts 7/7 LE Doppler negative for DVT  Micro Data:  June 26 SARS-COV-2 Positivie July 5 Urine > staph epidermidis  Antimicrobials:  June 29 remdesivir June29Actemra June 29 solumedrol - decadron 7/1 >> June 29 convalescent plasma  ............................. 7/5  zyvox > 7/6 7/5 - cefepime > 7.6  Interim history/subjective:  Severe episode of dyssynchrony yesterday led to air trapping, bradycardia, improved significantly with changing  sedation from fentanyl to Dilaudid  Objective   Blood pressure 127/69, pulse 60, temperature 97.7 F (36.5 C), temperature source Axillary, resp. rate (!) 24, height 4\' 10"  (1.473 m), weight 95.6 kg, SpO2 (!) 88 %.    Vent Mode: PRVC FiO2 (%):  [50 %-80 %] 50 % Set Rate:  [24 bmp] 24 bmp Vt Set:  [400 mL] 400 mL PEEP:  [8 cmH20] 8 cmH20 Plateau Pressure:  [24 cmH20-29 cmH20] 29 cmH20   Intake/Output Summary (Last 24 hours) at 12/25/2018 1239 Last data filed at 12/25/2018 1200 Gross per 24 hour  Intake 5224.08 ml  Output 2900 ml  Net 2324.08 ml   Filed Weights   12/23/18 0500 12/24/18 0411 12/25/18 0351  Weight: 98.5 kg 98.4 kg 95.6 kg    Examination:  General:  In bed on vent HENT: NCAT ETT in place PULM: CTA B, vent supported breathing CV: RRR, no mgr GI: BS+, soft, nontender MSK: normal bulk and tone Neuro: sedated on vent  December 24, 2018 chest x-ray images independently reviewed showing severe bilateral airspace disease, some cardiomegaly, endotracheal tube in place, PICC line in place  Resolved Hospital Problem list     Assessment & Plan:  ARDS due to COVID 19 pneumonia: Oxygenation improving slowly Continue fentanyl and Versed titrated to an RA SS goal of -2 Has periods of severe ventilator dyssynchrony leading to increased air trapping and vagal response Ventilator associated pneumonia prevention Continue full mechanical ventilatory support per ARDS protocol, targeting tidal volume 6 to 8 cc/kg ideal body weight, keeping driving pressure less than 15 cm of water keeping plateau pressure less than 30 cm of water Continue sedation targeted at RA SS -2, no wake-up assessment as severe  vent dyssynchrony led to air trapping, vagal response on July 9 Continue to wean PEEP and FiO2 as able, when closer to 5 of PEEP, 40% FiO2 can consider more aggressive wake up with pressure support  Bradycardia: Due to air trapping, increased thoracic pressure with severe ventilator  dyssynchrony Telemetry monitoring Keep sedation at -2, no wake-up assessment Atropine at bedside to use as needed  Acute encephalopathy Continue fentanyl Versed Continue clonazepam, Seroquel RA SS target remains -2  AKI : resolved Monitor BMET and UOP Replace electrolytes as needed   Diastolic heart failure Agree with more Lasix today Telemetry monitoring  Best practice:  Diet: tube feeding Pain/Anxiety/Delirium protocol (if indicated): RA SS goal -2, fentanyl/versed adding oral agents as above VAP protocol (if indicated): Yes DVT prophylaxis: heparin infusion GI prophylaxis: yes Pantoprazole for stress ulcer prophylaxis Glucose control: SSI Mobility: bed rest Code Status: full Family Communication: per [facility] Disposition: remain in ICU  Labs   CBC: Recent Labs  Lab 12/19/18 0500 12/20/18 0405  12/21/18 0450 12/21/18 0452 12/22/18 0510 12/23/18 0612 12/24/18 0425 12/24/18 1150  WBC 7.8 6.7  --  7.2  --  9.1 9.5 9.8  --   NEUTROABS 6.7  --   --   --   --   --   --   --   --   HGB 11.6* 11.8*   < > 11.1* 10.5* 11.8* 11.9* 11.1* 11.9*  HCT 38.3 37.9   < > 35.8* 31.0* 37.7 37.6 35.6* 35.0*  MCV 98.5 100.0  --  99.7  --  99.5 98.2 100.0  --   PLT 230 226  --  200  --  198 176 154  --    < > = values in this interval not displayed.    Basic Metabolic Panel: Recent Labs  Lab 12/19/18 0500 12/20/18 0405  12/21/18 0450  12/22/18 0510 12/23/18 0612 12/24/18 0425 12/24/18 1150 12/25/18 0441  NA 146* 151*   < > 148*   < > 144 142 144 141 141  K 4.1 3.6   < > 3.9   < > 3.6 4.3 4.4 4.2 4.6  CL 111 112*  --  112*  --  111 109 108  --  106  CO2 24 29  --  26  --  26 24 26   --  28  GLUCOSE 210* 144*  --  240*  --  156* 213* 196*  --  164*  BUN 84* 99*  --  79*  --  66* 65* 65*  --  63*  CREATININE 1.44* 1.52*  --  1.31*  --  1.07* 1.03* 0.97  --  0.92  CALCIUM 7.6* 8.0*  --  8.2*  --  7.9* 8.1* 8.0*  --  8.1*  MG 2.2 2.3  --  2.2  --  2.3  --   --   --   --    < >  = values in this interval not displayed.   GFR: Estimated Creatinine Clearance: 51.6 mL/min (by C-G formula based on SCr of 0.92 mg/dL). Recent Labs  Lab 12/20/18 1330 12/21/18 0450 12/22/18 0510 12/23/18 0612 12/24/18 0425  PROCALCITON <0.10 <0.10 <0.10  --   --   WBC  --  7.2 9.1 9.5 9.8  LATICACIDVEN 1.3  --   --   --   --     Liver Function Tests: Recent Labs  Lab 12/20/18 0405 12/21/18 0450 12/22/18 0510 12/23/18 0612 12/24/18 0425  AST 22 26  27 30 27   ALT 30 23 19 23 24   ALKPHOS 76 82 78 84 80  BILITOT 0.1* 0.2* 0.3 0.3 0.3  PROT 4.5* 4.5* 4.3* 4.6* 4.4*  ALBUMIN 2.2* 2.1* 2.0* 2.0* 1.9*   No results for input(s): LIPASE, AMYLASE in the last 168 hours. No results for input(s): AMMONIA in the last 168 hours.  ABG    Component Value Date/Time   PHART 7.394 12/24/2018 1150   PCO2ART 45.0 12/24/2018 1150   PO2ART 64.0 (L) 12/24/2018 1150   HCO3 27.5 12/24/2018 1150   TCO2 29 12/24/2018 1150   ACIDBASEDEF 5.0 (H) 12/15/2018 1501   O2SAT 92.0 12/24/2018 1150     Coagulation Profile: No results for input(s): INR, PROTIME in the last 168 hours.  Cardiac Enzymes: No results for input(s): CKTOTAL, CKMB, CKMBINDEX, TROPONINI in the last 168 hours.  HbA1C: Hgb A1c MFr Bld  Date/Time Value Ref Range Status  12/20/2018 04:06 AM 6.4 (H) 4.8 - 5.6 % Final    Comment:    (NOTE) Pre diabetes:          5.7%-6.4% Diabetes:              >6.4% Glycemic control for   <7.0% adults with diabetes   11/08/2016 07:24 AM 5.6 4.8 - 5.6 % Final    Comment:    (NOTE)         Pre-diabetes: 5.7 - 6.4         Diabetes: >6.4         Glycemic control for adults with diabetes: <7.0     CBG: Recent Labs  Lab 12/24/18 1959 12/24/18 2339 12/25/18 0348 12/25/18 0830 12/25/18 1126  GLUCAP 162* 134* 161* 151* 174*     Critical care time: 35 minutes     Roselie Awkward, MD [place] [facility] Pager: [phone] Cell: [phone] If no response, call [phone]

## 2018-12-25 NOTE — Progress Notes (Signed)
PROGRESS NOTE  Jody Taylor MIW:[id number] DOB: [birth year] DOA: 12/15/2018  PCP: Nicoletta Dress, MD  Brief History/Interval Summary: Patient is a 78 y.o. female with PMHx of HTN, CAD, chronic diastolic heart failure, severe persistent asthma on benralizumab, hypothyroidism, CKD stage III-presented to [facility] with acute respiratory distress-was found to have acute hypoxic respiratory failure secondary to COVID-19 pneumonia and emergently intubated and subsequently transferred to [facility] course complicated by ventilator dyssynchrony requiring prn paralytics, atrial fibrillation-and concern for encephalopathy as she has been at times not responsive even when off sedation.  See below for further details  Reason for Visit: Acute respiratory disease due to COVID-19  Consultants: Pulmonology  Procedures:  Intubation.   Right internal jugular central venous catheter placement 6/29  Transthoracic echocardiogram 1. The left ventricle has normal systolic function with an ejection fraction of 60-65%. The cavity size was normal. There is moderate asymmetric left ventricular hypertrophy. Left ventricular diastolic parameters were normal.  2. The right ventricle has normal systolic function. The cavity was normal. There is no increase in right ventricular wall thickness.  3. No evidence of mitral valve stenosis.  4. No stenosis of the aortic valve.  5. The aortic root and ascending aorta are normal in size and structure.  6. The interatrial septum was not assessed.   Antibiotics: Anti-infectives (From admission, onward)   Start     Dose/Rate Route Frequency Ordered Stop   12/20/18 1230  linezolid (ZYVOX) IVPB 600 mg  Status:  Discontinued     600 mg 300 mL/hr over 60 Minutes Intravenous Every 12 hours 12/20/18 1210 12/21/18 1227   12/20/18 1230  ceFEPIme (MAXIPIME) 2 g in sodium chloride 0.9 % 100 mL IVPB  Status:  Discontinued     2 g 200 mL/hr over 30  Minutes Intravenous Every 12 hours 12/20/18 1229 12/22/18 1113   12/15/18 2000  remdesivir 100 mg in sodium chloride 0.9 % 250 mL IVPB     100 mg 500 mL/hr over 30 Minutes Intravenous Every 24 hours 12/04/2018 1845 12/18/18 2053   12/05/2018 2000  remdesivir 200 mg in sodium chloride 0.9 % 250 mL IVPB     200 mg 500 mL/hr over 30 Minutes Intravenous Once 11/19/2018 1845 11/17/2018 2116       Subjective/Interval History: Patient remains intubated and on mechanical ventilation. Was noted to have bloody secretions from respiratory tract yesterday evening.  Her anticoagulation was held.  Not responsive this morning.   Assessment/Plan:  Acute Hypoxic Resp. Failure due to Acute Covid 19 Viral Illness/ARDS  Vent Mode: PRVC FiO2 (%):  [60 %-80 %] 60 % Set Rate:  [24 bmp-30 bmp] 24 bmp Vt Set:  [400 mL] 400 mL PEEP:  [8 cmH20] 8 cmH20 Plateau Pressure:  [24 cmH20-28 cmH20] 24 cmH20     Component Value Date/Time   PHART 7.394 12/24/2018 1150   PCO2ART 45.0 12/24/2018 1150   PO2ART 64.0 (L) 12/24/2018 1150   HCO3 27.5 12/24/2018 1150   TCO2 29 12/24/2018 1150   ACIDBASEDEF 5.0 (H) 12/15/2018 1501   O2SAT 92.0 12/24/2018 1150    COVID-19 Labs  Recent Labs    12/23/18 0607 12/23/18 0612 12/24/18 0425 12/25/18 0441  DDIMER  --  3.29* 2.99* 8.85*  FERRITIN 27  --   --   --   CRP <0.8  --   --   --      Fever: Has been afebrile the last 24 hours. Oxygen requirements: Remains on  mechanical ventilation.  60% FiO2.  Saturating in the early 90s.    Antibiotics: Linezolid and cefepime were discontinued on 7/7. Remdesivir: Completed course of Remdesivir Steroids: Remains on dexamethasone.  Consider tapering. Diuretics: She was given Lasix the last 2 days.  She will be given an additional dose today. Actemra: Received Actemra on 6/29 at [facility] Plasma: Received convalescent plasma on 6/30 Vitamin C and Zinc: Continue DVT Prophylaxis: On heparin IV infusion.  Held  last night due to bloody secretions.  Okay to resume if hemoglobin is stable.  Patient remains intubated and on mechanical ventilation.  Pulmonology is following and managing.  Patient has completed medical treatment for COVID-19 with Remdesivir.  She was given Actemra and convalescent plasma.  She remains on dexamethasone. There was some concern for superimposed bacterial pneumonia and so the patient was on linezolid and cefepime for a few days.  Procalcitonin was consistently less than 0.1 and so these antibiotics were discontinued on 7/7.  Patient has been given diuretics the last 2 days with which she has had a good response.  She will be given additional dose today. Chest x-ray done on 7/9 did not show any significant changes compared to previous films.  Did show bilateral infiltrates.    Acute metabolic encephalopathy Encephalopathy is most likely due to acute illness.  CT head done on 7/6 did not show any acute findings.  Not very responsive today due to sedation.  Continue to monitor.  Steroid-induced hyperglycemia in the setting of known prediabetes HbA1c 6.4.  Monitor CBGs.  Continue Levemir and SSI.  CBGs are reasonably well controlled.  Continue current regimen.  Essential hypertension Well-controlled.  Hydralazine as needed.    Hypernatremia Resolved with free water.  History of paroxysmal atrial fibrillation Noted on telemetry on 7/7.  Patient started on IV heparin.  Echocardiogram did not show any concerning findings.  TSH and free T4 are normal.  IV heparin was placed on hold yesterday due to bloody secretions.  Hemoglobin pending from this morning.  If stable can resume IV heparin.  Did have episodes of bradycardia yesterday due to vagal stimulation while being suctioned.  Had to be given atropine.  Elevated d-dimer Lower extremity Doppler study on 7/6 were negative for DVT.  Elevated d-dimer most likely due to COVID-19.  Dimer had improved to 2.99.  Increased to 8.85 today.   Significance of this is unclear.  Continue to trend.  Chronic kidney disease stage III Renal function remains close to baseline.  She has had good diuresis with Lasix.  Continue to monitor closely.    History of coronary artery disease Continue aspirin.  History of chronic diastolic CHF Patient with significant amount of weight gain in the hospital.  Given Lasix with good response.  Weight has decreased.  History of severe persistent asthma No wheezing noted.  Patient was on benralizumab as outpatient.  History of dyslipidemia Continue statin.  History of hypothyroidism Continue levothyroxine.  TSH and free T4 were normal.  History of GERD Continue PPI  History of anxiety and depression Holding Seroquel.  Positive urine cultures Urine culture grew staph epidermidis.  Blood cultures negative.  Patient's procalcitonin level was less than 0.1.  Patient was given cefepime and linezolid for about 3 days.  Due to clinical stability patient was not given any additional antibiotics.    Nutrition Continue tube feedings.   DVT Prophylaxis: We will resume IV heparin today if hemoglobin is stable. PUD Prophylaxis: Protonix Code Status: DNR Family Communication:  Discussed with son yesterday.  Will do so again today. Disposition Plan: Remain in ICU   Medications:  Scheduled:  sodium chloride   Intravenous Once   aspirin  81 mg Per Tube Daily   chlorhexidine  15 mL Mouth/Throat BID   Chlorhexidine Gluconate Cloth  6 each Topical Daily   clonazePAM  1 mg Per Tube BID   dexamethasone (DECADRON) injection  6 mg Intravenous Q12H   free water  350 mL Per Tube Q4H   insulin aspart  0-15 Units Subcutaneous Q4H   insulin detemir  20 Units Subcutaneous Daily   levothyroxine  50 mcg Intravenous Daily   mouth rinse  15 mL Mouth Rinse 10 times per day   pantoprazole sodium  40 mg Per Tube Daily   QUEtiapine  25 mg Per Tube BID   sodium chloride flush  10-40 mL Intracatheter  Q12H   vitamin C  500 mg Per Tube Daily   zinc sulfate  220 mg Per Tube Daily   Continuous:  sodium chloride 10 mL/hr at 12/25/18 0600   dexmedetomidine (PRECEDEX) IV infusion     feeding supplement (VITAL HIGH PROTEIN) 50 mL/hr at 12/24/18 2300   heparin Stopped (12/24/18 1600)   HYDROmorphone 2 mg/hr (12/25/18 0600)   midazolam 2 mg/hr (12/24/18 1800)   YWV:PXTGGYIRSWNIO, atropine, hydrALAZINE, HYDROmorphone, ipratropium-albuterol, midazolam, nitroGLYCERIN, [DISCONTINUED] ondansetron **OR** ondansetron (ZOFRAN) IV, polyethylene glycol   Objective:  Vital Signs  Vitals:   12/25/18 0425 12/25/18 0500 12/25/18 0600 12/25/18 0700  BP: 107/70 119/66 112/71 124/66  Pulse: 70 63 (!) 59 60  Resp:  (!) 24 (!) 24 (!) 24  Temp:      TempSrc:      SpO2: 94% 97% 92% 93%  Weight:      Height:        Intake/Output Summary (Last 24 hours) at 12/25/2018 0811 Last data filed at 12/25/2018 0600 Gross per 24 hour  Intake 3541.25 ml  Output 3925 ml  Net -383.75 ml   Filed Weights   12/23/18 0500 12/24/18 0411 12/25/18 0351  Weight: 98.5 kg 98.4 kg 95.6 kg    General appearance: Intubated and sedated Resp: Coarse breath sounds bilaterally.  Crackles at the bases.  Improved air entry bilaterally.  No wheezing or rhonchi.   Cardio: S1-S2 is irregularly irregular. GI: Abdomen is soft.  Nontender nondistended.  Bowel sounds are present normal.  No masses organomegaly Extremities: Continues to have edema bilateral lower extremity.  Though there is improvement.  Neurologic: Intubated and sedated.   Lab Results:  Data Reviewed: I have personally reviewed following labs and imaging studies  CBC: Recent Labs  Lab 12/19/18 0500 12/20/18 0405  12/21/18 0450 12/21/18 0452 12/22/18 0510 12/23/18 0612 12/24/18 0425 12/24/18 1150  WBC 7.8 6.7  --  7.2  --  9.1 9.5 9.8  --   NEUTROABS 6.7  --   --   --   --   --   --   --   --   HGB 11.6* 11.8*   < > 11.1* 10.5* 11.8* 11.9*  11.1* 11.9*  HCT 38.3 37.9   < > 35.8* 31.0* 37.7 37.6 35.6* 35.0*  MCV 98.5 100.0  --  99.7  --  99.5 98.2 100.0  --   PLT 230 226  --  200  --  198 176 154  --    < > = values in this interval not displayed.    Basic Metabolic Panel: Recent Labs  Lab  12/19/18 0500 12/20/18 0405  12/21/18 0450  12/22/18 0510 12/23/18 0612 12/24/18 0425 12/24/18 1150 12/25/18 0441  NA 146* 151*   < > 148*   < > 144 142 144 141 141  K 4.1 3.6   < > 3.9   < > 3.6 4.3 4.4 4.2 4.6  CL 111 112*  --  112*  --  111 109 108  --  106  CO2 24 29  --  26  --  26 24 26   --  28  GLUCOSE 210* 144*  --  240*  --  156* 213* 196*  --  164*  BUN 84* 99*  --  79*  --  66* 65* 65*  --  63*  CREATININE 1.44* 1.52*  --  1.31*  --  1.07* 1.03* 0.97  --  0.92  CALCIUM 7.6* 8.0*  --  8.2*  --  7.9* 8.1* 8.0*  --  8.1*  MG 2.2 2.3  --  2.2  --  2.3  --   --   --   --    < > = values in this interval not displayed.    GFR: Estimated Creatinine Clearance: 51.6 mL/min (by C-G formula based on SCr of 0.92 mg/dL).  Liver Function Tests: Recent Labs  Lab 12/20/18 0405 12/21/18 0450 12/22/18 0510 12/23/18 0612 12/24/18 0425  AST 22 26 27 30 27   ALT 30 23 19 23 24   ALKPHOS 76 82 78 84 80  BILITOT 0.1* 0.2* 0.3 0.3 0.3  PROT 4.5* 4.5* 4.3* 4.6* 4.4*  ALBUMIN 2.2* 2.1* 2.0* 2.0* 1.9*    CBG: Recent Labs  Lab 12/24/18 1227 12/24/18 1704 12/24/18 1959 12/24/18 2339 12/25/18 0348  GLUCAP 178* 157* 162* 134* 161*    Anemia Panel: Recent Labs    12/23/18 0607  FERRITIN 27    Recent Results (from the past 240 hour(s))  Culture, respiratory (non-expectorated)     Status: None   Collection Time: 12/20/18 12:03 PM   Specimen: Tracheal Aspirate; Respiratory  Result Value Ref Range Status   Specimen Description   Final    TRACHEAL ASPIRATE Performed at [facility] [address] [place], [place] [address]    Special Requests   Final    Immunocompromised Performed at [facility], [place] [address] [place], [place] [address]    Gram Stain   Final    FEW WBC PRESENT, PREDOMINANTLY PMN RARE SQUAMOUS EPITHELIAL CELLS PRESENT MODERATE GRAM POSITIVE COCCI IN CLUSTERS RARE GRAM POSITIVE RODS RARE BUDDING YEAST SEEN    Culture   Final    FEW Consistent with normal respiratory flora. Performed at [facility], [place] [address], [place], [place] [address]    Report Status 12/23/2018 FINAL  Final  Culture, Urine     Status: Abnormal   Collection Time: 12/20/18 12:03 PM   Specimen: Urine, Clean Catch  Result Value Ref Range Status   Specimen Description   Final    URINE, CLEAN CATCH Performed at [facility], [place] [address], [place], [place] [address]    Special Requests   Final    Immunocompromised Performed at [facility], [place] [address] [place], [place] [address]    Culture >=100,000 COLONIES/mL STAPHYLOCOCCUS EPIDERMIDIS (A)  Final   Report Status 12/22/2018 FINAL  Final   Organism ID, Bacteria STAPHYLOCOCCUS EPIDERMIDIS (A)  Final      Susceptibility   Staphylococcus epidermidis - MIC*  CIPROFLOXACIN >=8 RESISTANT Resistant     GENTAMICIN <=0.5 SENSITIVE Sensitive     NITROFURANTOIN <=16 SENSITIVE Sensitive     OXACILLIN >=4 RESISTANT Resistant     TETRACYCLINE <=1 SENSITIVE Sensitive     VANCOMYCIN 2 SENSITIVE Sensitive     TRIMETH/SULFA <=10 SENSITIVE Sensitive     CLINDAMYCIN <=0.25 SENSITIVE Sensitive     RIFAMPIN <=0.5 SENSITIVE Sensitive     Inducible Clindamycin NEGATIVE Sensitive     * >=100,000 COLONIES/mL STAPHYLOCOCCUS EPIDERMIDIS  Culture, blood (Routine X 2) w Reflex to ID Panel     Status: None (Preliminary result)   Collection Time: 12/20/18 12:40 PM   Specimen: BLOOD  Result Value Ref Range Status   Specimen Description   Final    BLOOD LEFT ANTECUBITAL Performed at [facility] [address], [place], [place] [address]    Special Requests   Final      BOTTLES DRAWN AEROBIC ONLY Blood Culture adequate volume Performed at [facility] [address] [place], [place] [address]    Culture   Final    NO GROWTH 4 DAYS Performed at [facility], [place] [address], [place], [place] [address]    Report Status PENDING  Incomplete  Culture, blood (Routine X 2) w Reflex to ID Panel     Status: None (Preliminary result)   Collection Time: 12/20/18 12:45 PM   Specimen: BLOOD  Result Value Ref Range Status   Specimen Description   Final    BLOOD LEFT HAND Performed at [facility] [address] [place], [place] [address]    Special Requests   Final    BOTTLES DRAWN AEROBIC ONLY Blood Culture adequate volume Performed at [facility] [address], [place], [place] [address]    Culture   Final    NO GROWTH 4 DAYS Performed at [facility], [place] [address] [place], [place] [address]    Report Status PENDING  Incomplete      Radiology Studies: Dg Chest Port 1 View  Result Date: 12/24/2018 CLINICAL DATA:  Pneumonia due to COVID-19 virus. EXAM: PORTABLE CHEST 1 VIEW COMPARISON:  Radiograph of December 23, 2018. FINDINGS: The heart size and mediastinal contours are within normal limits. Endotracheal and feeding tubes are unchanged in position. Atherosclerosis of thoracic aorta is noted. No pneumothorax is noted. Stable bilateral lung opacities are noted most consistent with multifocal pneumonia. The visualized skeletal structures are unremarkable. IMPRESSION: Stable support apparatus. Stable bilateral lung opacities are noted most consistent with multifocal pneumonia. Electronically Signed   By: Marijo Conception M.D.   On: 12/24/2018 10:21       LOS: 11 days   [facility] Pager on [email or web address]  12/25/2018, 8:11 AM

## 2018-12-25 NOTE — Progress Notes (Signed)
[place] for heparin Indication: atrial fibrillation  Allergies  Allergen Reactions  . Ace Inhibitors Other (See Comments) and Cough    CHEST PAIN  . Other     Patient reports receiving blood after a miscarriage "years ago". She states she broke out from receiving this blood. Has not received any since  . Codeine Nausea And Vomiting  . Levaquin [Levofloxacin] Nausea And Vomiting    Patient Measurements: Height: 4\' 10"  (147.3 cm) Weight: 210 lb 12.2 oz (95.6 kg) IBW/kg (Calculated) : 40.9 Heparin Dosing Weight: 64 kg   Vital Signs: Temp: 97.7 F (36.5 C) (07/10 1200) Temp Source: Axillary (07/10 1200) BP: 127/69 (07/10 1200) Pulse Rate: 60 (07/10 1200)  Labs: Recent Labs    12/23/18 0612  12/23/18 1820 12/24/18 0425 12/24/18 0545 12/24/18 1150 12/24/18 1350 12/25/18 0441 12/25/18 0954  HGB 11.9*  --   --  11.1*  --  11.9*  --   --  10.8*  HCT 37.6  --   --  35.6*  --  35.0*  --   --  36.0  PLT 176  --   --  154  --   --   --   --  133*  HEPARINUNFRC  --    < > 0.60  --  0.34  --  0.40  --   --   CREATININE 1.03*  --   --  0.97  --   --   --  0.92  --    < > = values in this interval not displayed.    Estimated Creatinine Clearance: 51.6 mL/min (by C-G formula based on SCr of 0.92 mg/dL).   Medical History: Past Medical History:  Diagnosis Date  . 2-vessel coronary artery disease   . Adult hypothyroidism   . Allergic rhinitis 09/12/2016  . Anemia of chronic disease   . Arthritis   . Asthma   . CAD in native artery 05/02/2015   Overview:   S/P PCI and stent x 2. Last cardiac cath August 2010 with mild nonobstructive CAD and normal LV function PCI and stent of proximal [facility] 1999 Cath Nov 2016:Angiographic findings Cardiac Arteries and Lesion Findings LMCA: Normal. LAD: Normal. LCx: Normal. RCA: Abnormal. Lesion on R PDA: Ostial.35% stenosis 5 mm length . Pre procedure TIMI III flow was noted. Good run off was  present.Bifurcation lesion.  Cath 05/08/15:Mild non-obstructive coronary artery disease. Normal LV function  . Cancer ([place]) 03/31/2017  . Chronic diastolic CHF (congestive heart failure) ([place])   . Chronic GERD   . Chronic kidney disease, stage 3 (moderate) ([place])    patient denies (listed in 07/20/16 PCP notes-Dr. Nelda Bucks)  . CKD (chronic kidney disease) 06/05/2015  . Colon polyp   . Cough 09/12/2016  . Depression    since hysterectomy   . Dyspnea 09/12/2016  . Hallucinations 08/17/2013  . Heart attack ([place])   . Hyperlipidemia LDL goal <70   . Hypertension, essential, benign   . Obstructive sleep apnea 09/12/2016  . Polio   . PONV (postoperative nausea and vomiting)   . PONV (postoperative nausea and vomiting)   . 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 Sleep apnea    wears CPAP  . Spondylolisthesis of lumbosacral region 11/07/2016  . Stroke [facility])    "i had a mini stroke I didn't even know I had it" found on MRI  . Vaginal atrophy 01/30/2016  . Vitamin D deficiency   . Wheezing 09/12/2016  Medications:  Facility-Administered Medications Prior to Admission  Medication Dose Route Frequency Provider Last Rate Last Dose  . Benralizumab SOSY 30 mg  30 mg Subcutaneous Q8 Weeks Kozlow, Donnamarie Poag, MD   30 mg at 10/22/18 1120   Medications Prior to Admission  Medication Sig Dispense Refill Last Dose  . cefUROXime (CEFTIN) 250 MG tablet Take 250 mg by mouth 2 (two) times a day. Take 250mg  by mouth twice daily for seven (7) days     . levothyroxine (SYNTHROID) 100 MCG tablet    unknown  . methylPREDNISolone (MEDROL DOSEPAK) 4 MG TBPK tablet Take 1-6 tablets by mouth See admin instructions. Take 6 tabs by mouth the first day, then decrease dosage by 1 tablet each day until complete.   unknown  . montelukast (SINGULAIR) 10 MG tablet TAKE ONE TABLET BY MOUTH AT BEDTIME (Patient taking differently: Take 10 mg by mouth at bedtime. ) 30 tablet 0 unknown  . nitroGLYCERIN (NITROSTAT) 0.4 MG  SL tablet      . promethazine (PHENERGAN) 25 MG tablet Take 25 mg by mouth 4 (four) times daily as needed for nausea or vomiting.    unknown  . TRELEGY ELLIPTA 100-62.5-25 MCG/INH AEPB Inhale 1 Dose into the lungs daily. Rinse, gargle, and spit after use. 36 each 5 unknown    Assessment: 47 YOF diagnosed with COVID. She developed Afib during her ICU stay with CHADSVASc score of 7 (Age, female gender, HTN, stroke, CHF). She was on IV heparin for Afib until 7/9 when she experienced bloody secretions for which heparin was stopped. Bleeding has since resolved and CBC remains stable today with some further decline in platelets. Pharmacy consulted to resume IV heparin on 7/10  Goal of Therapy:  Heparin level 0.3-0.5 units/ml Monitor platelets by anticoagulation protocol: Yes   Plan:  Resume heparin infusion at 500 unis/hr. No bolus  Will check 8 hr HL  Monitor daily heparin level, CBC, s/s of bleed  Albertina Parr, PharmD., BCPS Clinical Pharmacist Clinical phone for 12/25/18 until 5pm: [phone]

## 2018-12-26 ENCOUNTER — Inpatient Hospital Stay (HOSPITAL_COMMUNITY): Payer: Medicare HMO

## 2018-12-26 DIAGNOSIS — D696 Thrombocytopenia, unspecified: Secondary | ICD-10-CM

## 2018-12-26 LAB — CBC
HCT: 35.4 % — ABNORMAL LOW (ref 36.0–46.0)
Hemoglobin: 10.8 g/dL — ABNORMAL LOW (ref 12.0–15.0)
MCH: 30.6 pg (ref 26.0–34.0)
MCHC: 30.5 g/dL (ref 30.0–36.0)
MCV: 100.3 fL — ABNORMAL HIGH (ref 80.0–100.0)
Platelets: 110 10*3/uL — ABNORMAL LOW (ref 150–400)
RBC: 3.53 MIL/uL — ABNORMAL LOW (ref 3.87–5.11)
RDW: 14.6 % (ref 11.5–15.5)
WBC: 11.2 10*3/uL — ABNORMAL HIGH (ref 4.0–10.5)
nRBC: 0 % (ref 0.0–0.2)

## 2018-12-26 LAB — BASIC METABOLIC PANEL
Anion gap: 8 (ref 5–15)
BUN: 65 mg/dL — ABNORMAL HIGH (ref 8–23)
CO2: 29 mmol/L (ref 22–32)
Calcium: 7.8 mg/dL — ABNORMAL LOW (ref 8.9–10.3)
Chloride: 99 mmol/L (ref 98–111)
Creatinine, Ser: 0.9 mg/dL (ref 0.44–1.00)
GFR calc Af Amer: >60 mL/min (ref >60)
GFR calc non Af Amer: >60 mL/min (ref >60)
Glucose, Bld: 126 mg/dL — ABNORMAL HIGH (ref 70–99)
Potassium: 4 mmol/L (ref 3.5–5.1)
Sodium: 136 mmol/L (ref 135–145)

## 2018-12-26 LAB — HEPARIN LEVEL (UNFRACTIONATED): Heparin Unfractionated: 0.1 IU/mL — ABNORMAL LOW (ref 0.30–0.70)

## 2018-12-26 LAB — GLUCOSE, CAPILLARY
Glucose-Capillary: 109 mg/dL — ABNORMAL HIGH (ref 70–99)
Glucose-Capillary: 118 mg/dL — ABNORMAL HIGH (ref 70–99)
Glucose-Capillary: 121 mg/dL — ABNORMAL HIGH (ref 70–99)
Glucose-Capillary: 122 mg/dL — ABNORMAL HIGH (ref 70–99)
Glucose-Capillary: 123 mg/dL — ABNORMAL HIGH (ref 70–99)
Glucose-Capillary: 123 mg/dL — ABNORMAL HIGH (ref 70–99)

## 2018-12-26 LAB — APTT
aPTT: 81 seconds — ABNORMAL HIGH (ref 24–36)
aPTT: 118 seconds — ABNORMAL HIGH (ref 24–36)

## 2018-12-26 LAB — D-DIMER, QUANTITATIVE: D-Dimer, Quant: 15.77 ug/mL-FEU — ABNORMAL HIGH (ref 0.00–0.50)

## 2018-12-26 MED ORDER — SODIUM CHLORIDE 0.9 % IV SOLN
0.0500 mg/kg/h | INTRAVENOUS | Status: DC
  Administered 2018-12-26: 0.05 mg/kg/h via INTRAVENOUS
  Filled 2018-12-26: qty 250

## 2018-12-26 MED ORDER — SODIUM CHLORIDE 0.9 % IV SOLN
0.0250 mg/kg/h | INTRAVENOUS | Status: DC
  Administered 2018-12-26: 23:00:00 0.025 mg/kg/h via INTRAVENOUS
  Filled 2018-12-26: qty 250

## 2018-12-26 MED ORDER — HEPARIN (PORCINE) 25000 UT/250ML-% IV SOLN
500.0000 [IU]/h | INTRAVENOUS | Status: DC
  Administered 2018-12-26: 01:00:00 450 [IU]/h via INTRAVENOUS

## 2018-12-26 MED ORDER — SODIUM CHLORIDE 0.9 % IV BOLUS
500.0000 mL | Freq: Once | INTRAVENOUS | Status: AC
  Administered 2018-12-26: 500 mL via INTRAVENOUS

## 2018-12-26 NOTE — Progress Notes (Signed)
NAME:  Jody Taylor, MRN:  [id number], DOB:  [birth year], LOS: 12 ADMISSION DATE:  12/02/2018, CONSULTATION DATE:  6/29 REFERRING MD:  Sloan Leiter, CHIEF COMPLAINT:  Dyspnea   Brief History   78 y/o female with a history of diastolic heart failure admitted on June 29 for ARDS in setting of COVID 19 pneumonia.  Required intubation at [facility] emergency room.  Past Medical History  Diastolic heart failure History of stroke (mini stroke) Obstructive sleep apnea on CPAP Hypertension Hyperlipidemia Coronary artery disease, had PCI in 5284 Chronic diastolic heart failure Chronic kidney disease GERD Allergic rhinitis  Significant Hospital Events   June 29 admission July 2 severe vent dyssynchrony July 3 oxygenation worsening overnight, tvol 12cc/kg IB on SIMV, changed to [facility], paralytic used July 4 no acute events July 5 paralytic used July 6 no acute changed, fever, sedation minimized July 7 Afib July 8 severe dyssyncrhony, large tidal volumes, diaphoresis, fentanyl drip added back, low dose versed, changed back to [facility] July 9 more comfortable, stable TVol, severe ventilator dyssynchrony lead increased intrathoracic pressure, bradycardia requiring atropine emergently.  Change sedation from fentanyl to Dilaudid, improved in a later synchrony July 10 stable, weaning vent, diuresing  Consults:  PCCM  Procedures:  June 29 endotracheal tube> June 29 right internal jugular central venous line>June 29 June 30 PICC >   Significant Diagnostic Tests:  7/6 CT head > hold infarcts 7/7 LE Doppler negative for DVT  Micro Data:  June 26 SARS-COV-2 Positivie July 5 Urine > staph epidermidis  Antimicrobials:  June 29 remdesivir June29Actemra June 29 solumedrol - decadron 7/1 >> June 29 convalescent plasma  ............................. 7/5  zyvox > 7/6 7/5 - cefepime > 7.6  Interim history/subjective:   No acute events Remains well sedated Oxygenation improving Urine  output is adequate  Objective   Blood pressure 106/72, pulse 63, temperature 98.3 F (36.8 C), temperature source Axillary, resp. rate (!) 28, height 4\' 10"  (1.473 m), weight 97.8 kg, SpO2 96 %.    Vent Mode: PRVC FiO2 (%):  [50 %-60 %] 50 % Set Rate:  [24 bmp] 24 bmp Vt Set:  [400 mL] 400 mL PEEP:  [8 cmH20] 8 cmH20 Plateau Pressure:  [26 cmH20-31 cmH20] 27 cmH20   Intake/Output Summary (Last 24 hours) at 12/26/2018 0800 Last data filed at 12/26/2018 0700 Gross per 24 hour  Intake 2644.28 ml  Output 1985 ml  Net 659.28 ml   Filed Weights   12/24/18 0411 12/25/18 0351 12/26/18 0500  Weight: 98.4 kg 95.6 kg 97.8 kg    Examination:  General:  Chronically ill appearing in bed on vent HENT: NCAT ETT in place PULM: CTA B, vent supported breathing CV: RRR, no mgr GI: BS+, soft, nontender MSK: normal bulk and tone Neuro: sedated on vent  July 11 CXR images independently reviewed showing persistent airspace disease bilaterally improved compared to June 30 study  [facility] Problem list     Assessment & Plan:  ARDS due to COVID 19 pneumonia: Oxygenation improving slowly Continue fentanyl and Versed titrated to an RA SS goal of -2 Has periods of severe ventilator dyssynchrony leading to increased air trapping and vagal response Continue mechanical ventilation per ARDS protocol Target TVol 6-8cc/kgIBW Target Plateau Pressure < 30cm H20 Target driving pressure less than 15 cm of water Target PaO2 55-65 As long as PaO2 to FiO2 ratio is less than 1:50 position in prone position for 16 hours a day Check CVP daily if CVL in place Titrate PEEP/FiO2  per ARDS table Target CVP less than 4, diurese as necessary Ventilator associated pneumonia prevention protocol Maintain sedation, RA SS target -2, for ventilator synchrony Consider tracheostomy if no improvement in the next few days  Bradycardia: Due to air trapping, increased thoracic pressure with severe ventilator  dyssynchrony Telemetry monitoring Keep sedation target RA SS -2, no wake-up assessment again today Once PEEP at 5, FiO2 at 40% start waking her up  Acute encephalopathy RA SS target -2, continue Dilaudid, Versed, wean as able today Continue clonazepam, quetiapine  AKI : resolved Monitor BMET and UOP Replace electrolytes as needed   Diastolic heart failure Agree with more lasix today tele  Best practice:  Diet: tube feeding Pain/Anxiety/Delirium protocol (if indicated): RA SS goal -2, dilaudid/versed adding oral agents as above VAP protocol (if indicated): Yes DVT prophylaxis: heparin infusion GI prophylaxis: yes Pantoprazole for stress ulcer prophylaxis Glucose control: SSI Mobility: bed rest Code Status: full Family Communication: per [facility] Disposition: remain in ICU  Labs   CBC: Recent Labs  Lab 12/21/18 0450  12/22/18 0510 12/23/18 0612 12/24/18 0425 12/24/18 1150 12/25/18 0954  WBC 7.2  --  9.1 9.5 9.8  --  11.0*  HGB 11.1*   < > 11.8* 11.9* 11.1* 11.9* 10.8*  HCT 35.8*   < > 37.7 37.6 35.6* 35.0* 36.0  MCV 99.7  --  99.5 98.2 100.0  --  100.3*  PLT 200  --  198 176 154  --  133*   < > = values in this interval not displayed.    Basic Metabolic Panel: Recent Labs  Lab 12/20/18 0405  12/21/18 0450  12/22/18 0510 12/23/18 0612 12/24/18 0425 12/24/18 1150 12/25/18 0441 12/26/18 0500  NA 151*   < > 148*   < > 144 142 144 141 141 136  K 3.6   < > 3.9   < > 3.6 4.3 4.4 4.2 4.6 4.0  CL 112*  --  112*  --  111 109 108  --  106 99  CO2 29  --  26  --  26 24 26   --  28 29  GLUCOSE 144*  --  240*  --  156* 213* 196*  --  164* 126*  BUN 99*  --  79*  --  66* 65* 65*  --  63* 65*  CREATININE 1.52*  --  1.31*  --  1.07* 1.03* 0.97  --  0.92 0.90  CALCIUM 8.0*  --  8.2*  --  7.9* 8.1* 8.0*  --  8.1* 7.8*  MG 2.3  --  2.2  --  2.3  --   --   --   --   --    < > = values in this interval not displayed.   GFR: Estimated Creatinine Clearance: 53.5 mL/min (by C-G  formula based on SCr of 0.9 mg/dL). Recent Labs  Lab 12/20/18 1330 12/21/18 0450 12/22/18 0510 12/23/18 0612 12/24/18 0425 12/25/18 0954  PROCALCITON <0.10 <0.10 <0.10  --   --   --   WBC  --  7.2 9.1 9.5 9.8 11.0*  LATICACIDVEN 1.3  --   --   --   --   --     Liver Function Tests: Recent Labs  Lab 12/20/18 0405 12/21/18 0450 12/22/18 0510 12/23/18 0612 12/24/18 0425  AST 22 26 27 30 27   ALT 30 23 19 23 24   ALKPHOS 76 82 78 84 80  BILITOT 0.1* 0.2* 0.3 0.3 0.3  PROT 4.5*  4.5* 4.3* 4.6* 4.4*  ALBUMIN 2.2* 2.1* 2.0* 2.0* 1.9*   No results for input(s): LIPASE, AMYLASE in the last 168 hours. No results for input(s): AMMONIA in the last 168 hours.  ABG    Component Value Date/Time   PHART 7.394 12/24/2018 1150   PCO2ART 45.0 12/24/2018 1150   PO2ART 64.0 (L) 12/24/2018 1150   HCO3 27.5 12/24/2018 1150   TCO2 29 12/24/2018 1150   ACIDBASEDEF 5.0 (H) 12/15/2018 1501   O2SAT 92.0 12/24/2018 1150     Coagulation Profile: No results for input(s): INR, PROTIME in the last 168 hours.  Cardiac Enzymes: No results for input(s): CKTOTAL, CKMB, CKMBINDEX, TROPONINI in the last 168 hours.  HbA1C: Hgb A1c MFr Bld  Date/Time Value Ref Range Status  12/20/2018 04:06 AM 6.4 (H) 4.8 - 5.6 % Final    Comment:    (NOTE) Pre diabetes:          5.7%-6.4% Diabetes:              >6.4% Glycemic control for   <7.0% adults with diabetes   11/08/2016 07:24 AM 5.6 4.8 - 5.6 % Final    Comment:    (NOTE)         Pre-diabetes: 5.7 - 6.4         Diabetes: >6.4         Glycemic control for adults with diabetes: <7.0     CBG: Recent Labs  Lab 12/25/18 1609 12/25/18 1947 12/25/18 2359 12/26/18 0440 12/26/18 0736  GLUCAP 217* 162* 122* 123* 121*     Critical care time: 31 minutes     Roselie Awkward, MD [place] [facility] Pager: [phone] Cell: [phone] If no response, call [phone]

## 2018-12-26 NOTE — Progress Notes (Signed)
PROGRESS NOTE  Jody Taylor BJS:[id number] DOB: 08-12-1940 DOA: 11/18/2018  PCP: Nicoletta Dress, MD  Brief History/Interval Summary: Patient is a 78 y.o. female with PMHx of HTN, CAD, chronic diastolic heart failure, severe persistent asthma on benralizumab, hypothyroidism, CKD stage III-presented to [facility] with acute respiratory distress-was found to have acute hypoxic respiratory failure secondary to COVID-19 pneumonia and emergently intubated and subsequently transferred to [facility] course complicated by ventilator dyssynchrony requiring prn paralytics, atrial fibrillation-and concern for encephalopathy as she has been at times not responsive even when off sedation.  See below for further details  Reason for Visit: Acute respiratory disease due to COVID-19  Consultants: Pulmonology  Procedures:  Intubation.   Right internal jugular central venous catheter placement 6/29  Transthoracic echocardiogram 1. The left ventricle has normal systolic function with an ejection fraction of 60-65%. The cavity size was normal. There is moderate asymmetric left ventricular hypertrophy. Left ventricular diastolic parameters were normal.  2. The right ventricle has normal systolic function. The cavity was normal. There is no increase in right ventricular wall thickness.  3. No evidence of mitral valve stenosis.  4. No stenosis of the aortic valve.  5. The aortic root and ascending aorta are normal in size and structure.  6. The interatrial septum was not assessed.   Antibiotics: Anti-infectives (From admission, onward)   Start     Dose/Rate Route Frequency Ordered Stop   12/20/18 1230  linezolid (ZYVOX) IVPB 600 mg  Status:  Discontinued     600 mg 300 mL/hr over 60 Minutes Intravenous Every 12 hours 12/20/18 1210 12/21/18 1227   12/20/18 1230  ceFEPIme (MAXIPIME) 2 g in sodium chloride 0.9 % 100 mL IVPB  Status:  Discontinued     2 g 200 mL/hr over 30  Minutes Intravenous Every 12 hours 12/20/18 1229 12/22/18 1113   12/15/18 2000  remdesivir 100 mg in sodium chloride 0.9 % 250 mL IVPB     100 mg 500 mL/hr over 30 Minutes Intravenous Every 24 hours 12/07/2018 1845 12/18/18 2053   11/28/2018 2000  remdesivir 200 mg in sodium chloride 0.9 % 250 mL IVPB     200 mg 500 mL/hr over 30 Minutes Intravenous Once 12/03/2018 1845 12/03/2018 2116       Subjective/Interval History: Remains intubated and on mechanical ventilation.  No significant bloody secretions noted.  No significant episodes of bradycardia.     Assessment/Plan:  Acute Hypoxic Resp. Failure due to Acute Covid 19 Viral Illness/ARDS  Vent Mode: PRVC FiO2 (%):  [40 %-60 %] 40 % Set Rate:  [24 bmp] 24 bmp Vt Set:  [400 mL] 400 mL PEEP:  [8 cmH20] 8 cmH20 Plateau Pressure:  [26 cmH20-31 cmH20] 27 cmH20     Component Value Date/Time   PHART 7.394 12/24/2018 1150   PCO2ART 45.0 12/24/2018 1150   PO2ART 64.0 (L) 12/24/2018 1150   HCO3 27.5 12/24/2018 1150   TCO2 29 12/24/2018 1150   ACIDBASEDEF 5.0 (H) 12/15/2018 1501   O2SAT 92.0 12/24/2018 1150    COVID-19 Labs  Recent Labs    12/24/18 0425 12/25/18 0441 12/26/18 0500  DDIMER 2.99* 8.85* 15.77*     Fever: No fever in the last 24 hours Oxygen requirements: On mechanical ventilation.  40% FiO2.  Saturating in the late 80s to early 90s.    Antibiotics: Linezolid and cefepime were discontinued on 7/7. Remdesivir: Completed course of Remdesivir Steroids: Remains on dexamethasone.  Will start tapering. Diuretics:  Lasix has been given to her for the last 3 days.   Actemra: Received [facility]emra on 6/29 at [facility] Plasma: Received convalescent plasma on 6/30 Vitamin C and Zinc: Continue DVT Prophylaxis: Continue IV heparin.    Patient remains intubated and on mechanical ventilation.  Pulmonology is following and managing.  Patient has completed medical treatment for COVID-19 with Remdesivir.  Patient was  also given Actemra and convalescent plasma.  Dexamethasone should be tapered down.  Patient also received linezolid and cefepime for a few days due to concern for superimposed secondary bacterial infection.  Procalcitonin level has been less than 0.1.  These antibiotics were discontinued on 7/7.  Patient has responded well to diuretics.  Will hold today due to borderline low blood pressures. Chest x-ray done this morning does not show any significant improvement in bilateral pneumonia.  Thrombocytopenia Drop in platelet counts noted.  Patient is on IV heparin.  Concerning for HIT.  Panel will be ordered.  Change to Argatroban.  Acute metabolic encephalopathy Encephalopathy is most likely due to acute illness.  CT head done on 7/6 did not show any acute findings.  Continue to monitor  Steroid-induced hyperglycemia in the setting of known prediabetes HbA1c 6.4.  Monitor CBGs.  Continue Levemir and SSI.  CBGs are reasonably well controlled.  Continue current regimen.  Monitor CBGs while her steroids are tapered down.  Essential hypertension Blood pressure noted to be borderline low.  Continue to monitor closely.   Hypernatremia Resolved with free water.  History of paroxysmal atrial fibrillation Noted on telemetry on 7/7.  Patient started on IV heparin.  Echocardiogram did not show any concerning findings.  TSH and free T4 are normal.  Continue IV heparin for now.  No further episodes of bradycardia.  Did have episodes of bradycardia 7/9 due to vagal stimulation while being suctioned.  Had to be given atropine.  Elevated d-dimer Lower extremity Doppler study on 7/6 were negative for DVT.  Elevated d-dimer most likely due to COVID-19.  D-dimer continues to worsen.  Up to 15.77.   Chronic kidney disease stage III Renal function remains close to baseline.  She has had good diuresis with Lasix.  Continue to monitor closely.    History of coronary artery disease Continue aspirin.  History of  chronic diastolic CHF Patient had significant weight gain in the hospital.  This has been improving with diuresis.    History of severe persistent asthma No wheezing noted.  Patient was on benralizumab as outpatient.  History of dyslipidemia Continue statin.  History of hypothyroidism Continue levothyroxine.  TSH and free T4 were normal.  History of GERD Continue PPI  History of anxiety and depression Holding Seroquel.  Positive urine cultures Urine culture grew staph epidermidis.  Blood cultures negative.  Patient's procalcitonin level was less than 0.1.  Patient was given cefepime and linezolid for about 3 days.  Due to clinical stability patient was not given any additional antibiotics.    Nutrition Continue tube feedings.   DVT Prophylaxis: Has been on IV heparin PUD Prophylaxis: Protonix Code Status: DNR Family Communication: Discussed with son on a daily basis Disposition Plan: Remain in ICU   Medications:  Scheduled: . sodium chloride   Intravenous Once  . aspirin  81 mg Per Tube Daily  . chlorhexidine  15 mL Mouth/Throat BID  . Chlorhexidine Gluconate Cloth  6 each Topical Daily  . clonazePAM  1 mg Per Tube BID  . free water  350 mL Per Tube Q4H  .  insulin aspart  0-15 Units Subcutaneous Q4H  . insulin detemir  20 Units Subcutaneous Daily  . levothyroxine  50 mcg Intravenous Daily  . mouth rinse  15 mL Mouth Rinse 10 times per day  . pantoprazole sodium  40 mg Per Tube Daily  . QUEtiapine  25 mg Per Tube BID  . sodium chloride flush  10-40 mL Intracatheter Q12H  . vitamin C  500 mg Per Tube Daily  . zinc sulfate  220 mg Per Tube Daily   Continuous: . sodium chloride 10 mL/hr at 12/26/18 1012  . dexmedetomidine (PRECEDEX) IV infusion    . feeding supplement (VITAL HIGH PROTEIN) 50 mL/hr at 12/25/18 2006  . heparin 500 Units/hr (12/26/18 1006)  . HYDROmorphone 2 mg/hr (12/26/18 1000)  . midazolam 2 mg/hr (12/26/18 1000)   EXN:[id number], atropine,  hydrALAZINE, HYDROmorphone, ipratropium-albuterol, midazolam, nitroGLYCERIN, [DISCONTINUED] ondansetron **OR** ondansetron (ZOFRAN) IV, polyethylene glycol   Objective:  Vital Signs  Vitals:   12/26/18 0800 12/26/18 0900 12/26/18 0939 12/26/18 1000  BP: 101/66 102/60  99/63  Pulse: 66 63 67 (!) 58  Resp: (!) 24 (!) 24 (!) 24 (!) 24  Temp: 97.9 F (36.6 C)     TempSrc: Axillary     SpO2: 90% 94% (!) 89% (!) 88%  Weight:      Height:        Intake/Output Summary (Last 24 hours) at 12/26/2018 1203 Last data filed at 12/26/2018 1000 Gross per 24 hour  Intake 3252.95 ml  Output 1775 ml  Net 1477.95 ml   Filed Weights   12/24/18 0411 12/25/18 0351 12/26/18 0500  Weight: 98.4 kg 95.6 kg 97.8 kg    General appearance: Intubated and sedated. Resp: Sounds bilaterally.  Crackles at the bases bilaterally.  No wheezing or rhonchi.   Cardio: S1-S2 is irregularly irregular.   GI: Abdomen is soft.  Nontender nondistended.  Bowel sounds are present normal.  No masses organomegaly Extremities: Improved edema bilateral lower extremities. Neurologic: intubated and sedated    Lab Results:  Data Reviewed: I have personally reviewed following labs and imaging studies  CBC: Recent Labs  Lab 12/22/18 0510 12/23/18 0612 12/24/18 0425 12/24/18 1150 12/25/18 0954 12/26/18 0500  WBC 9.1 9.5 9.8  --  11.0* 11.2*  HGB 11.8* 11.9* 11.1* 11.9* 10.8* 10.8*  HCT 37.7 37.6 35.6* 35.0* 36.0 35.4*  MCV 99.5 98.2 100.0  --  100.3* 100.3*  PLT 198 176 154  --  133* 110*    Basic Metabolic Panel: Recent Labs  Lab 12/20/18 0405  12/21/18 0450  12/22/18 0510 12/23/18 0612 12/24/18 0425 12/24/18 1150 12/25/18 0441 12/26/18 0500  NA 151*   < > 148*   < > 144 142 144 141 141 136  K 3.6   < > 3.9   < > 3.6 4.3 4.4 4.2 4.6 4.0  CL 112*  --  112*  --  111 109 108  --  106 99  CO2 29  --  26  --  26 24 26   --  28 29  GLUCOSE 144*  --  240*  --  156* 213* 196*  --  164* 126*  BUN 99*  --  79*   --  66* 65* 65*  --  63* 65*  CREATININE 1.52*  --  1.31*  --  1.07* 1.03* 0.97  --  0.92 0.90  CALCIUM 8.0*  --  8.2*  --  7.9* 8.1* 8.0*  --  8.1* 7.8*  MG 2.3  --  2.2  --  2.3  --   --   --   --   --    < > = values in this interval not displayed.    GFR: Estimated Creatinine Clearance: 53.5 mL/min (by C-G formula based on SCr of 0.9 mg/dL).  Liver Function Tests: Recent Labs  Lab 12/20/18 0405 12/21/18 0450 12/22/18 0510 12/23/18 0612 12/24/18 0425  AST 22 26 27 30 27   ALT 30 23 19 23 24   ALKPHOS 76 82 78 84 80  BILITOT 0.1* 0.2* 0.3 0.3 0.3  PROT 4.5* 4.5* 4.3* 4.6* 4.4*  ALBUMIN 2.2* 2.1* 2.0* 2.0* 1.9*    CBG: Recent Labs  Lab 12/25/18 1947 12/25/18 2359 12/26/18 0440 12/26/18 0736 12/26/18 1129  GLUCAP 162* 122* 123* 121* 118*    Anemia Panel: No results for input(s): VITAMINB12, FOLATE, FERRITIN, TIBC, IRON, RETICCTPCT in the last 72 hours.  Recent Results (from the past 240 hour(s))  Culture, respiratory (non-expectorated)     Status: None   Collection Time: 12/20/18 12:03 PM   Specimen: Tracheal Aspirate; Respiratory  Result Value Ref Range Status   Specimen Description   Final    TRACHEAL ASPIRATE Performed at [facility] [address] [place], [place] [address]    Special Requests   Final    Immunocompromised Performed at [facility], [place] [address] [place], [place] [address]    Gram Stain   Final    FEW WBC PRESENT, PREDOMINANTLY PMN RARE SQUAMOUS EPITHELIAL CELLS PRESENT MODERATE GRAM POSITIVE COCCI IN CLUSTERS RARE GRAM POSITIVE RODS RARE BUDDING YEAST SEEN    Culture   Final    FEW Consistent with normal respiratory flora. Performed at [facility], [place] [address] [place], [place] [address]    Report Status 12/23/2018 FINAL  Final  Culture, Urine     Status: Abnormal   Collection Time: 12/20/18 12:03 PM   Specimen: Urine, Clean Catch  Result Value Ref Range Status   Specimen  Description   Final    URINE, CLEAN CATCH Performed at [facility], [place] [address] [place], [place] [address]    Special Requests   Final    Immunocompromised Performed at [facility], [place] [address], [place], [place] [address]    Culture >=100,000 COLONIES/mL STAPHYLOCOCCUS EPIDERMIDIS (A)  Final   Report Status 12/22/2018 FINAL  Final   Organism ID, Bacteria STAPHYLOCOCCUS EPIDERMIDIS (A)  Final      Susceptibility   Staphylococcus epidermidis - MIC*    CIPROFLOXACIN >=8 RESISTANT Resistant     GENTAMICIN <=0.5 SENSITIVE Sensitive     NITROFURANTOIN <=16 SENSITIVE Sensitive     OXACILLIN >=4 RESISTANT Resistant     TETRACYCLINE <=1 SENSITIVE Sensitive     VANCOMYCIN 2 SENSITIVE Sensitive     TRIMETH/SULFA <=10 SENSITIVE Sensitive     CLINDAMYCIN <=0.25 SENSITIVE Sensitive     RIFAMPIN <=0.5 SENSITIVE Sensitive     Inducible Clindamycin NEGATIVE Sensitive     * >=100,000 COLONIES/mL STAPHYLOCOCCUS EPIDERMIDIS  Culture, blood (Routine X 2) w Reflex to ID Panel     Status: None   Collection Time: 12/20/18 12:40 PM   Specimen: BLOOD  Result Value Ref Range Status   Specimen Description   Final    BLOOD LEFT ANTECUBITAL Performed at [facility] [address], [place],  [address]    Special Requests   Final    BOTTLES DRAWN AEROBIC ONLY Blood Culture  adequate volume Performed at [facility] [address] [place], [place] [address]    Culture   Final    NO GROWTH 5 DAYS Performed at [facility], [place] [address] [place], [place] [address]    Report Status 12/25/2018 FINAL  Final  Culture, blood (Routine X 2) w Reflex to ID Panel     Status: None   Collection Time: 12/20/18 12:45 PM   Specimen: BLOOD  Result Value Ref Range Status   Specimen Description   Final    BLOOD LEFT HAND Performed at [facility] [address], [place], [place] [address]     Special Requests   Final    BOTTLES DRAWN AEROBIC ONLY Blood Culture adequate volume Performed at [facility] [address] [place], [place] [address]    Culture   Final    NO GROWTH 5 DAYS Performed at [facility], [place] [address] [place], [place] [address]    Report Status 12/25/2018 FINAL  Final      Radiology Studies: Dg Chest Port 1 View  Result Date: 12/26/2018 CLINICAL DATA:  Pneumonia due to COVID-19 virus. EXAM: PORTABLE CHEST 1 VIEW COMPARISON:  12/24/2018 and older exams. FINDINGS: Patchy bilateral airspace lung opacities with more diffuse interstitial type opacities are without change from the prior study. No new lung abnormalities. No convincing pleural effusion.  No pneumothorax. Endotracheal tube, nasal/orogastric tube and right PICC are stable. IMPRESSION: 1. No change from the most recent prior exam. 2. Persistent bilateral lung opacities consistent with multifocal pneumonia. 3. Stable support apparatus. Electronically Signed   By: Lajean Manes M.D.   On: 12/26/2018 09:11       LOS: 12 days   Laramie Meissner [employer] Pager on [email or web address]  12/26/2018, 12:03 PM

## 2018-12-26 NOTE — Progress Notes (Addendum)
eLink Physician-Brief Progress Note Patient Name: Jody Taylor DOB: 1940/07/20 MRN: [id number]   Date of Service  12/26/2018  HPI/Events of Note  Notified by RN that BP has been in the 80s despite decreasing sedation.   Pt with ARDS, COVID (+).  eICU Interventions  Give 500 cc NS bolus x 1.      Intervention Category Intermediate Interventions: Hypotension - evaluation and management  Elsie Lincoln 12/26/2018, 10:53 PM   Notified of acute urinary retention of more than 500cc on bladder scan.  Pt has purewick cath in place.  On video assessment, pt remains hypotensive with BP in the 80s.  Pt was given 500 cc bolus earlier with no effect.  Sedation had to be increased as patient became asynchronous with the vent.  Plan> Restart levophed. Insert foley cath.

## 2018-12-26 NOTE — Progress Notes (Signed)
[place] for heparin > bivalirudin  Indication: atrial fibrillation  Allergies  Allergen Reactions  . Ace Inhibitors Other (See Comments) and Cough    CHEST PAIN  . Other     Patient reports receiving blood after a miscarriage "years ago". She states she broke out from receiving this blood. Has not received any since  . Codeine Nausea And Vomiting  . Levaquin [Levofloxacin] Nausea And Vomiting    Patient Measurements: Height: 4\' 10"  (147.3 cm) Weight: 215 lb 9.8 oz (97.8 kg) IBW/kg (Calculated) : 40.9 Heparin Dosing Weight: 64 kg   Vital Signs: Temp: 97.9 F (36.6 C) (07/11 0800) Temp Source: Axillary (07/11 0800) BP: 102/60 (07/11 0900) Pulse Rate: 67 (07/11 0939)  Labs: Recent Labs    12/24/18 0425  12/24/18 1150 12/24/18 1350 12/25/18 0441 12/25/18 0954 12/25/18 2235 12/26/18 0500 12/26/18 0900  HGB 11.1*  --  11.9*  --   --  10.8*  --  10.8*  --   HCT 35.6*  --  35.0*  --   --  36.0  --  35.4*  --   PLT 154  --   --   --   --  133*  --  110*  --   HEPARINUNFRC  --    < >  --  0.40  --   --  0.87*  --  0.10*  CREATININE 0.97  --   --   --  0.92  --   --  0.90  --    < > = values in this interval not displayed.    Estimated Creatinine Clearance: 53.5 mL/min (by C-G formula based on SCr of 0.9 mg/dL).   Medical History: Past Medical History:  Diagnosis Date  . 2-vessel coronary artery disease   . Adult hypothyroidism   . Allergic rhinitis 09/12/2016  . Anemia of chronic disease   . Arthritis   . Asthma   . CAD in native artery 05/02/2015   Overview:   S/P PCI and stent x 2. Last cardiac cath August 2010 with mild nonobstructive CAD and normal LV function PCI and stent of proximal [facility] 1999 Cath Nov 2016:Angiographic findings Cardiac Arteries and Lesion Findings LMCA: Normal. LAD: Normal. LCx: Normal. RCA: Abnormal. Lesion on R PDA: Ostial.35% stenosis 5 mm length . Pre procedure TIMI III flow was noted. Good run off was  present.Bifurcation lesion.  Cath 05/08/15:Mild non-obstructive coronary artery disease. Normal LV function  . Cancer ([place]) 03/31/2017  . Chronic diastolic CHF (congestive heart failure) ([place])   . Chronic GERD   . Chronic kidney disease, stage 3 (moderate) ([place])    patient denies (listed in 07/20/16 PCP notes-Dr. Nelda Bucks)  . CKD (chronic kidney disease) 06/05/2015  . Colon polyp   . Cough 09/12/2016  . Depression    since hysterectomy   . Dyspnea 09/12/2016  . Hallucinations 08/17/2013  . Heart attack ([place])   . Hyperlipidemia LDL goal <70   . Hypertension, essential, benign   . Obstructive sleep apnea 09/12/2016  . Polio   . PONV (postoperative nausea and vomiting)   . PONV (postoperative nausea and vomiting)   . 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 Sleep apnea    wears CPAP  . Spondylolisthesis of lumbosacral region 11/07/2016  . Stroke [facility])    "i had a mini stroke I didn't even know I had it" found on MRI  . Vaginal atrophy 01/30/2016  . Vitamin D deficiency   . Wheezing 09/12/2016  Medications:  Facility-Administered Medications Prior to Admission  Medication Dose Route Frequency Provider Last Rate Last Dose  . Benralizumab SOSY 30 mg  30 mg Subcutaneous Q8 Weeks Kozlow, Donnamarie Poag, MD   30 mg at 10/22/18 1120   Medications Prior to Admission  Medication Sig Dispense Refill Last Dose  . cefUROXime (CEFTIN) 250 MG tablet Take 250 mg by mouth 2 (two) times a day. Take 250mg  by mouth twice daily for seven (7) days     . levothyroxine (SYNTHROID) 100 MCG tablet    unknown  . methylPREDNISolone (MEDROL DOSEPAK) 4 MG TBPK tablet Take 1-6 tablets by mouth See admin instructions. Take 6 tabs by mouth the first day, then decrease dosage by 1 tablet each day until complete.   unknown  . montelukast (SINGULAIR) 10 MG tablet TAKE ONE TABLET BY MOUTH AT BEDTIME (Patient taking differently: Take 10 mg by mouth at bedtime. ) 30 tablet 0 unknown  . nitroGLYCERIN (NITROSTAT) 0.4 MG  SL tablet      . promethazine (PHENERGAN) 25 MG tablet Take 25 mg by mouth 4 (four) times daily as needed for nausea or vomiting.    unknown  . TRELEGY ELLIPTA 100-62.5-25 MCG/INH AEPB Inhale 1 Dose into the lungs daily. Rinse, gargle, and spit after use. 46 each 5 unknown    Assessment: 22 YOF diagnosed with COVID. She developed Afib during her ICU stay with CHADSVASc score of 7 (Age, female gender, HTN, stroke, CHF). She was on IV heparin for Afib until 7/9 when she experienced bloody secretions for which heparin was stopped and then resumed on 7/10.   Of note, patient's Plt count has trended down to 110 today. 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 Pharmacy consulted to stop IV heparin and start [place]. H/H remains low stable.    Goal of Therapy:  aPTT 50-85 seconds Monitor platelets by anticoagulation protocol: Yes   Plan:  Stop IV heparin and start conservative dose of bivalirudin at 0.05 mg/kg/hr given patient sensitivity to heparin.  F/u q2hr aPTT until they are stable in goal range  Will need to monitor platelet trend closely   Albertina Parr, PharmD., BCPS Clinical Pharmacist Clinical phone for 12/26/18 until 5pm: [phone]

## 2018-12-26 NOTE — Progress Notes (Signed)
Updated Jody Taylor over the phone on pt's status. Jody Taylor was pleased w/ info & was very appreciative of everything the staff is doing for her mother.

## 2018-12-26 NOTE — Progress Notes (Signed)
[place] for heparin > bivalirudin  Indication: atrial fibrillation  Allergies  Allergen Reactions  . Ace Inhibitors Other (See Comments) and Cough    CHEST PAIN  . Heparin Other (See Comments)    HITT  . Other     Patient reports receiving blood after a miscarriage "years ago". She states she broke out from receiving this blood. Has not received any since  . Codeine Nausea And Vomiting  . Levaquin [Levofloxacin] Nausea And Vomiting    Patient Measurements: Height: 4\' 10"  (147.3 cm) Weight: 215 lb 9.8 oz (97.8 kg) IBW/kg (Calculated) : 40.9 Heparin Dosing Weight: 64 kg   Vital Signs: Temp: 97.2 F (36.2 C) (07/11 1600) Temp Source: Axillary (07/11 1600) BP: 99/51 (07/11 1700) Pulse Rate: 58 (07/11 1700)  Labs: Recent Labs    12/24/18 0425  12/24/18 1150 12/24/18 1350 12/25/18 0441 12/25/18 0954 12/25/18 2235 12/26/18 0500 12/26/18 0900 12/26/18 1539  HGB 11.1*  --  11.9*  --   --  10.8*  --  10.8*  --   --   HCT 35.6*  --  35.0*  --   --  36.0  --  35.4*  --   --   PLT 154  --   --   --   --  133*  --  110*  --   --   APTT  --   --   --   --   --   --   --   --   --  81*  HEPARINUNFRC  --    < >  --  0.40  --   --  0.87*  --  0.10*  --   CREATININE 0.97  --   --   --  0.92  --   --  0.90  --   --    < > = values in this interval not displayed.    Estimated Creatinine Clearance: 53.5 mL/min (by C-G formula based on SCr of 0.9 mg/dL).   Medical History: Past Medical History:  Diagnosis Date  . 2-vessel coronary artery disease   . Adult hypothyroidism   . Allergic rhinitis 09/12/2016  . Anemia of chronic disease   . Arthritis   . Asthma   . CAD in native artery 05/02/2015   Overview:   S/P PCI and stent x 2. Last cardiac cath August 2010 with mild nonobstructive CAD and normal LV function PCI and stent of proximal [facility] 1999 Cath Nov 2016:Angiographic findings Cardiac Arteries and Lesion Findings LMCA: Normal. LAD:  Normal. LCx: Normal. RCA: Abnormal. Lesion on R PDA: Ostial.35% stenosis 5 mm length . Pre procedure TIMI III flow was noted. Good run off was present.Bifurcation lesion.  Cath 05/08/15:Mild non-obstructive coronary artery disease. Normal LV function  . Cancer ([place]) 03/31/2017  . Chronic diastolic CHF (congestive heart failure) ([place])   . Chronic GERD   . Chronic kidney disease, stage 3 (moderate) ([place])    patient denies (listed in 07/20/16 PCP notes-Dr. Nelda Bucks)  . CKD (chronic kidney disease) 06/05/2015  . Colon polyp   . Cough 09/12/2016  . Depression    since hysterectomy   . Dyspnea 09/12/2016  . Hallucinations 08/17/2013  . Heart attack ([place])   . Hyperlipidemia LDL goal <70   . Hypertension, essential, benign   . Obstructive sleep apnea 09/12/2016  . Polio   . PONV (postoperative nausea and vomiting)   . PONV (postoperative nausea and vomiting)   .  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 Sleep apnea    wears CPAP  . Spondylolisthesis of lumbosacral region 11/07/2016  . Stroke [facility])    "i had a mini stroke I didn't even know I had it" found on MRI  . Vaginal atrophy 01/30/2016  . Vitamin D deficiency   . Wheezing 09/12/2016    Medications:  Facility-Administered Medications Prior to Admission  Medication Dose Route Frequency Provider Last Rate Last Dose  . Benralizumab SOSY 30 mg  30 mg Subcutaneous Q8 Weeks Kozlow, Donnamarie Poag, MD   30 mg at 10/22/18 1120   Medications Prior to Admission  Medication Sig Dispense Refill Last Dose  . cefUROXime (CEFTIN) 250 MG tablet Take 250 mg by mouth 2 (two) times a day. Take 250mg  by mouth twice daily for seven (7) days     . levothyroxine (SYNTHROID) 100 MCG tablet    unknown  . methylPREDNISolone (MEDROL DOSEPAK) 4 MG TBPK tablet Take 1-6 tablets by mouth See admin instructions. Take 6 tabs by mouth the first day, then decrease dosage by 1 tablet each day until complete.   unknown  . montelukast (SINGULAIR) 10 MG tablet TAKE ONE  TABLET BY MOUTH AT BEDTIME (Patient taking differently: Take 10 mg by mouth at bedtime. ) 30 tablet 0 unknown  . nitroGLYCERIN (NITROSTAT) 0.4 MG SL tablet      . promethazine (PHENERGAN) 25 MG tablet Take 25 mg by mouth 4 (four) times daily as needed for nausea or vomiting.    unknown  . TRELEGY ELLIPTA 100-62.5-25 MCG/INH AEPB Inhale 1 Dose into the lungs daily. Rinse, gargle, and spit after use. 75 each 5 unknown    Assessment: 70 YOF diagnosed with COVID. She developed Afib during her ICU stay with CHADSVASc score of 7 (Age, female gender, HTN, stroke, CHF). She was on IV heparin for Afib until 7/9 when she experienced bloody secretions for which heparin was stopped and then resumed on 7/10.   Of note, patient's Plt count has trended down to 110 today. 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 Pharmacy consulted to stop IV heparin and start [place]. H/H remains low stable.   12/26/18 6:12 PM  - aPTT 81 at goal - no bleeding or line issues reported  Goal of Therapy:  aPTT 50-85 seconds Monitor platelets by anticoagulation protocol: Yes   Plan:  Continue bivalirudin at 0.05 mg/kg/hr Recheck aPTT in 4 hours Will need to monitor platelet trend closely  Ulice Dash, PharmD, BCPS Clinical Pharmacist

## 2018-12-26 NOTE — Progress Notes (Addendum)
[place] for heparin > bivalirudin  Indication: atrial fibrillation  Allergies  Allergen Reactions  . Ace Inhibitors Other (See Comments) and Cough    CHEST PAIN  . Heparin Other (See Comments)    HITT  . Other     Patient reports receiving blood after a miscarriage "years ago". She states she broke out from receiving this blood. Has not received any since  . Codeine Nausea And Vomiting  . Levaquin [Levofloxacin] Nausea And Vomiting    Patient Measurements: Height: 4\' 10"  (147.3 cm) Weight: 215 lb 9.8 oz (97.8 kg) IBW/kg (Calculated) : 40.9 Heparin Dosing Weight: 64 kg   Vital Signs: Temp: 98.3 F (36.8 C) (07/11 2000) Temp Source: Oral (07/11 2000) BP: 90/50 (07/11 2105) Pulse Rate: 71 (07/11 2105)  Labs: Recent Labs    12/24/18 0425  12/24/18 1150 12/24/18 1350 12/25/18 0441 12/25/18 0954 12/25/18 2235 12/26/18 0500 12/26/18 0900 12/26/18 1539 12/26/18 1945  HGB 11.1*  --  11.9*  --   --  10.8*  --  10.8*  --   --   --   HCT 35.6*  --  35.0*  --   --  36.0  --  35.4*  --   --   --   PLT 154  --   --   --   --  133*  --  110*  --   --   --   APTT  --   --   --   --   --   --   --   --   --  81* 118*  HEPARINUNFRC  --    < >  --  0.40  --   --  0.87*  --  0.10*  --   --   CREATININE 0.97  --   --   --  0.92  --   --  0.90  --   --   --    < > = values in this interval not displayed.    Estimated Creatinine Clearance: 53.5 mL/min (by C-G formula based on SCr of 0.9 mg/dL).   Medical History: Past Medical History:  Diagnosis Date  . 2-vessel coronary artery disease   . Adult hypothyroidism   . Allergic rhinitis 09/12/2016  . Anemia of chronic disease   . Arthritis   . Asthma   . CAD in native artery 05/02/2015   Overview:   S/P PCI and stent x 2. Last cardiac cath August 2010 with mild nonobstructive CAD and normal LV function PCI and stent of proximal [facility] 1999 Cath Nov 2016:Angiographic findings Cardiac Arteries and  Lesion Findings LMCA: Normal. LAD: Normal. LCx: Normal. RCA: Abnormal. Lesion on R PDA: Ostial.35% stenosis 5 mm length . Pre procedure TIMI III flow was noted. Good run off was present.Bifurcation lesion.  Cath 05/08/15:Mild non-obstructive coronary artery disease. Normal LV function  . Cancer ([place]) 03/31/2017  . Chronic diastolic CHF (congestive heart failure) ([place])   . Chronic GERD   . Chronic kidney disease, stage 3 (moderate) ([place])    patient denies (listed in 07/20/16 PCP notes-Dr. Nelda Bucks)  . CKD (chronic kidney disease) 06/05/2015  . Colon polyp   . Cough 09/12/2016  . Depression    since hysterectomy   . Dyspnea 09/12/2016  . Hallucinations 08/17/2013  . Heart attack ([place])   . Hyperlipidemia LDL goal <70   . Hypertension, essential, benign   . Obstructive sleep apnea 09/12/2016  . Polio   .  PONV (postoperative nausea and vomiting)   . PONV (postoperative nausea and vomiting)   . 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 Sleep apnea    wears CPAP  . Spondylolisthesis of lumbosacral region 11/07/2016  . Stroke [facility])    "i had a mini stroke I didn't even know I had it" found on MRI  . Vaginal atrophy 01/30/2016  . Vitamin D deficiency   . Wheezing 09/12/2016    Medications:  Facility-Administered Medications Prior to Admission  Medication Dose Route Frequency Provider Last Rate Last Dose  . Benralizumab SOSY 30 mg  30 mg Subcutaneous Q8 Weeks Kozlow, Donnamarie Poag, MD   30 mg at 10/22/18 1120   Medications Prior to Admission  Medication Sig Dispense Refill Last Dose  . cefUROXime (CEFTIN) 250 MG tablet Take 250 mg by mouth 2 (two) times a day. Take 250mg  by mouth twice daily for seven (7) days     . levothyroxine (SYNTHROID) 100 MCG tablet    unknown  . methylPREDNISolone (MEDROL DOSEPAK) 4 MG TBPK tablet Take 1-6 tablets by mouth See admin instructions. Take 6 tabs by mouth the first day, then decrease dosage by 1 tablet each day until complete.   unknown  . montelukast  (SINGULAIR) 10 MG tablet TAKE ONE TABLET BY MOUTH AT BEDTIME (Patient taking differently: Take 10 mg by mouth at bedtime. ) 30 tablet 0 unknown  . nitroGLYCERIN (NITROSTAT) 0.4 MG SL tablet      . promethazine (PHENERGAN) 25 MG tablet Take 25 mg by mouth 4 (four) times daily as needed for nausea or vomiting.    unknown  . TRELEGY ELLIPTA 100-62.5-25 MCG/INH AEPB Inhale 1 Dose into the lungs daily. Rinse, gargle, and spit after use. 64 each 5 unknown    Assessment: 56 YOF diagnosed with COVID. She developed Afib during her ICU stay with CHADSVASc score of 7 (Age, female gender, HTN, stroke, CHF). She was on IV heparin for Afib until 7/9 when she experienced bloody secretions for which heparin was stopped and then resumed on 7/10.   Of note, patient's Plt count has trended down to 110 today. 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 Pharmacy consulted to stop IV heparin and start [place]. H/H remains low stable.   12/26/18 9:44 PM  - aPTT 118 above goal - no bleeding or line issues per RN  Goal of Therapy:  aPTT 50-85 seconds Monitor platelets by anticoagulation protocol: Yes   Plan:  Hold infusion x 1 hour then decrease bivalirudin to 0.025 mg/kg/hr Recheck aPTT in 2 hours Will need to monitor platelet trend closely  Ulice Dash, PharmD, BCPS Clinical Pharmacist

## 2018-12-26 NOTE — Progress Notes (Signed)
Patient's right arm very swollen compared to the left. Notified McQuaid, MD. Ultrasound ordered to rule out DVT.

## 2018-12-26 NOTE — Progress Notes (Signed)
[place] for heparin Indication: atrial fibrillation  Allergies  Allergen Reactions  . Ace Inhibitors Other (See Comments) and Cough    CHEST PAIN  . Other     Patient reports receiving blood after a miscarriage "years ago". She states she broke out from receiving this blood. Has not received any since  . Codeine Nausea And Vomiting  . Levaquin [Levofloxacin] Nausea And Vomiting    Patient Measurements: Height: 4\' 10"  (147.3 cm) Weight: 210 lb 12.2 oz (95.6 kg) IBW/kg (Calculated) : 40.9 Heparin Dosing Weight: 64 kg   Vital Signs: Temp: 98.3 F (36.8 C) (07/10 2000) Temp Source: Axillary (07/10 2000) BP: 106/62 (07/11 0000) Pulse Rate: 84 (07/11 0000)  Labs: Recent Labs    12/23/18 0612  12/24/18 0425 12/24/18 0545 12/24/18 1150 12/24/18 1350 12/25/18 0441 12/25/18 0954 12/25/18 2235  HGB 11.9*  --  11.1*  --  11.9*  --   --  10.8*  --   HCT 37.6  --  35.6*  --  35.0*  --   --  36.0  --   PLT 176  --  154  --   --   --   --  133*  --   HEPARINUNFRC  --    < >  --  0.34  --  0.40  --   --  0.87*  CREATININE 1.03*  --  0.97  --   --   --  0.92  --   --    < > = values in this interval not displayed.    Estimated Creatinine Clearance: 51.6 mL/min (by C-G formula based on SCr of 0.92 mg/dL).   Medical History: Past Medical History:  Diagnosis Date  . 2-vessel coronary artery disease   . Adult hypothyroidism   . Allergic rhinitis 09/12/2016  . Anemia of chronic disease   . Arthritis   . Asthma   . CAD in native artery 05/02/2015   Overview:   S/P PCI and stent x 2. Last cardiac cath August 2010 with mild nonobstructive CAD and normal LV function PCI and stent of proximal [facility] 1999 Cath Nov 2016:Angiographic findings Cardiac Arteries and Lesion Findings LMCA: Normal. LAD: Normal. LCx: Normal. RCA: Abnormal. Lesion on R PDA: Ostial.35% stenosis 5 mm length . Pre procedure TIMI III flow was noted. Good run off was  present.Bifurcation lesion.  Cath 05/08/15:Mild non-obstructive coronary artery disease. Normal LV function  . Cancer ([place]) 03/31/2017  . Chronic diastolic CHF (congestive heart failure) ([place])   . Chronic GERD   . Chronic kidney disease, stage 3 (moderate) ([place])    patient denies (listed in 07/20/16 PCP notes-Dr. Nelda Bucks)  . CKD (chronic kidney disease) 06/05/2015  . Colon polyp   . Cough 09/12/2016  . Depression    since hysterectomy   . Dyspnea 09/12/2016  . Hallucinations 08/17/2013  . Heart attack ([place])   . Hyperlipidemia LDL goal <70   . Hypertension, essential, benign   . Obstructive sleep apnea 09/12/2016  . Polio   . PONV (postoperative nausea and vomiting)   . PONV (postoperative nausea and vomiting)   . 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 Sleep apnea    wears CPAP  . Spondylolisthesis of lumbosacral region 11/07/2016  . Stroke [facility])    "i had a mini stroke I didn't even know I had it" found on MRI  . Vaginal atrophy 01/30/2016  . Vitamin D deficiency   . Wheezing 09/12/2016  Medications:  Facility-Administered Medications Prior to Admission  Medication Dose Route Frequency Provider Last Rate Last Dose  . Benralizumab SOSY 30 mg  30 mg Subcutaneous Q8 Weeks Kozlow, Donnamarie Poag, MD   30 mg at 10/22/18 1120   Medications Prior to Admission  Medication Sig Dispense Refill Last Dose  . cefUROXime (CEFTIN) 250 MG tablet Take 250 mg by mouth 2 (two) times a day. Take 250mg  by mouth twice daily for seven (7) days     . levothyroxine (SYNTHROID) 100 MCG tablet    unknown  . methylPREDNISolone (MEDROL DOSEPAK) 4 MG TBPK tablet Take 1-6 tablets by mouth See admin instructions. Take 6 tabs by mouth the first day, then decrease dosage by 1 tablet each day until complete.   unknown  . montelukast (SINGULAIR) 10 MG tablet TAKE ONE TABLET BY MOUTH AT BEDTIME (Patient taking differently: Take 10 mg by mouth at bedtime. ) 30 tablet 0 unknown  . nitroGLYCERIN (NITROSTAT) 0.4 MG  SL tablet      . promethazine (PHENERGAN) 25 MG tablet Take 25 mg by mouth 4 (four) times daily as needed for nausea or vomiting.    unknown  . TRELEGY ELLIPTA 100-62.5-25 MCG/INH AEPB Inhale 1 Dose into the lungs daily. Rinse, gargle, and spit after use. 45 each 5 unknown    Assessment: 22 YOF diagnosed with COVID. She developed Afib during her ICU stay with CHADSVASc score of 7 (Age, female gender, HTN, stroke, CHF). She was on IV heparin for Afib until 7/9 when she experienced bloody secretions for which heparin was stopped. Bleeding has since resolved and CBC remains stable today with some further decline in platelets. Pharmacy consulted to resume IV heparin on 7/10  AM Update:  - HL is 0.87 supreatherapeutic - No further bleeding issues noted per RN    Goal of Therapy:  Heparin level 0.3-0.5 units/ml Monitor platelets by anticoagulation protocol: Yes   Plan:  Decrease heparin infusion to 450 unis/hr. No bolus  Will check 8 hr HL  Monitor daily heparin level, CBC, s/s of bleed   Royetta Asal, PharmD, BCPS 12/26/2018 12:36 AM

## 2018-12-27 ENCOUNTER — Inpatient Hospital Stay (HOSPITAL_COMMUNITY): Payer: Medicare HMO

## 2018-12-27 DIAGNOSIS — M7989 Other specified soft tissue disorders: Secondary | ICD-10-CM

## 2018-12-27 LAB — BASIC METABOLIC PANEL
Anion gap: 9 (ref 5–15)
BUN: 62 mg/dL — ABNORMAL HIGH (ref 8–23)
CO2: 27 mmol/L (ref 22–32)
Calcium: 7.5 mg/dL — ABNORMAL LOW (ref 8.9–10.3)
Chloride: 101 mmol/L (ref 98–111)
Creatinine, Ser: 0.89 mg/dL (ref 0.44–1.00)
GFR calc Af Amer: >60 mL/min (ref >60)
GFR calc non Af Amer: >60 mL/min (ref >60)
Glucose, Bld: 116 mg/dL — ABNORMAL HIGH (ref 70–99)
Potassium: 3.8 mmol/L (ref 3.5–5.1)
Sodium: 137 mmol/L (ref 135–145)

## 2018-12-27 LAB — GLUCOSE, CAPILLARY
Glucose-Capillary: 104 mg/dL — ABNORMAL HIGH (ref 70–99)
Glucose-Capillary: 115 mg/dL — ABNORMAL HIGH (ref 70–99)
Glucose-Capillary: 156 mg/dL — ABNORMAL HIGH (ref 70–99)
Glucose-Capillary: 163 mg/dL — ABNORMAL HIGH (ref 70–99)
Glucose-Capillary: 175 mg/dL — ABNORMAL HIGH (ref 70–99)
Glucose-Capillary: 189 mg/dL — ABNORMAL HIGH (ref 70–99)
Glucose-Capillary: 225 mg/dL — ABNORMAL HIGH (ref 70–99)

## 2018-12-27 LAB — CBC
HCT: 37.5 % (ref 36.0–46.0)
Hemoglobin: 11.8 g/dL — ABNORMAL LOW (ref 12.0–15.0)
MCH: 31.4 pg (ref 26.0–34.0)
MCHC: 31.5 g/dL (ref 30.0–36.0)
MCV: 99.7 fL (ref 80.0–100.0)
Platelets: 127 10*3/uL — ABNORMAL LOW (ref 150–400)
RBC: 3.76 MIL/uL — ABNORMAL LOW (ref 3.87–5.11)
RDW: 14.9 % (ref 11.5–15.5)
WBC: 16.1 10*3/uL — ABNORMAL HIGH (ref 4.0–10.5)
nRBC: 0 % (ref 0.0–0.2)

## 2018-12-27 LAB — APTT
aPTT: 43 seconds — ABNORMAL HIGH (ref 24–36)
aPTT: 67 seconds — ABNORMAL HIGH (ref 24–36)
aPTT: 159 seconds — ABNORMAL HIGH (ref 24–36)
aPTT: 25 seconds (ref 24–36)
aPTT: 35 seconds (ref 24–36)

## 2018-12-27 LAB — MAGNESIUM: Magnesium: 2.2 mg/dL (ref 1.7–2.4)

## 2018-12-27 MED ORDER — SODIUM CHLORIDE 0.9 % IV BOLUS
1000.0000 mL | Freq: Once | INTRAVENOUS | Status: AC
  Administered 2018-12-27: 1000 mL via INTRAVENOUS

## 2018-12-27 MED ORDER — SODIUM CHLORIDE 0.9 % IV SOLN
1.0000 g | Freq: Three times a day (TID) | INTRAVENOUS | Status: DC
  Administered 2018-12-27 – 2018-12-28 (×4): 1 g via INTRAVENOUS
  Filled 2018-12-27 (×5): qty 1

## 2018-12-27 MED ORDER — VANCOMYCIN HCL 10 G IV SOLR
2000.0000 mg | Freq: Once | INTRAVENOUS | Status: AC
  Administered 2018-12-27: 13:00:00 2000 mg via INTRAVENOUS
  Filled 2018-12-27: qty 2000

## 2018-12-27 MED ORDER — POTASSIUM CHLORIDE 20 MEQ/15ML (10%) PO SOLN
40.0000 meq | Freq: Once | ORAL | Status: AC
  Administered 2018-12-27: 12:00:00 40 meq
  Filled 2018-12-27: qty 30

## 2018-12-27 MED ORDER — SODIUM CHLORIDE 0.9 % IV SOLN
0.0500 mg/kg/h | INTRAVENOUS | Status: DC
  Administered 2018-12-27: 05:00:00 0.03 mg/kg/h via INTRAVENOUS
  Filled 2018-12-27 (×2): qty 250

## 2018-12-27 MED ORDER — VANCOMYCIN HCL 10 G IV SOLR
1500.0000 mg | INTRAVENOUS | Status: DC
  Filled 2018-12-27: qty 1500

## 2018-12-27 MED ORDER — NOREPINEPHRINE 16 MG/250ML-% IV SOLN
0.0000 ug/min | INTRAVENOUS | Status: DC
  Administered 2018-12-27: 2 ug/min via INTRAVENOUS
  Administered 2018-12-28: 11 ug/min via INTRAVENOUS
  Administered 2018-12-30: 6 ug/min via INTRAVENOUS
  Administered 2018-12-31: 22 ug/min via INTRAVENOUS
  Filled 2018-12-27 (×5): qty 250

## 2018-12-27 NOTE — Progress Notes (Signed)
Pt suddenly became very dyssynchronous with the ventilator- still not following commands. Desatting to the 60's. PRN boluses were given of Dilaudid & Versed. Both gtt's were titrated up. Called RT to bedside to assess.

## 2018-12-27 NOTE — Progress Notes (Signed)
NAME:  Jody Taylor, MRN:  [id number], DOB:  01/06/1941, LOS: 58 ADMISSION DATE:  12/15/2018, CONSULTATION DATE:  6/29 REFERRING MD:  Sloan Leiter, CHIEF COMPLAINT:  Dyspnea   Brief History   78 y/o female with a history of diastolic heart failure admitted on June 29 for ARDS in setting of COVID 19 pneumonia.  Required intubation at [facility] emergency room.  Past Medical History  Diastolic heart failure History of stroke (mini stroke) Obstructive sleep apnea on CPAP Hypertension Hyperlipidemia Coronary artery disease, had PCI in 4975 Chronic diastolic heart failure Chronic kidney disease GERD Allergic rhinitis  Significant Hospital Events   June 29 admission July 2 severe vent dyssynchrony July 3 oxygenation worsening overnight, tvol 12cc/kg IB on SIMV, changed to [facility], paralytic used July 4 no acute events July 5 paralytic used July 6 no acute changed, fever, sedation minimized July 7 Afib July 8 severe dyssyncrhony, large tidal volumes, diaphoresis, fentanyl drip added back, low dose versed, changed back to [facility] July 9 more comfortable, stable TVol, severe ventilator dyssynchrony lead increased intrathoracic pressure, bradycardia requiring atropine emergently.  Change sedation from fentanyl to Dilaudid, improved in a later synchrony July 10 stable, weaning vent, diuresing July 12 increased FiO2 overnight, hypotensive started on levophed, thrombocytopenic started on argatroban  Consults:  PCCM  Procedures:  June 29 endotracheal tube> June 29 right internal jugular central venous line>June 29 June 30 PICC >   Significant Diagnostic Tests:  7/6 CT head > hold infarcts 7/7 LE Doppler negative for DVT  Micro Data:  June 26 SARS-COV-2 Positivie July 5 Urine > staph epidermidis  Antimicrobials:  June 29 remdesivir June29Actemra June 29 solumedrol - decadron 7/1 >> June 29 convalescent plasma  ............................. 7/5  zyvox > 7/6 7/5 - cefepime >  7.6  Interim history/subjective:   July 12 increased FiO2 overnight, hypotensive started on levophed, thrombocytopenic started on argatroban   Objective   Blood pressure (!) 86/45, pulse 80, temperature 99.3 F (37.4 C), temperature source Oral, resp. rate 19, height 4\' 10"  (1.473 m), weight 100.5 kg, SpO2 98 %.    Vent Mode: PRVC FiO2 (%):  [40 %-70 %] 70 % Set Rate:  [24 bmp] 24 bmp Vt Set:  [400 mL] 400 mL PEEP:  [8 cmH20] 8 cmH20 Plateau Pressure:  [21 cmH20-28 cmH20] 28 cmH20   Intake/Output Summary (Last 24 hours) at 12/27/2018 0803 Last data filed at 12/27/2018 3005 Gross per 24 hour  Intake 3285.36 ml  Output 1675 ml  Net 1610.36 ml   Filed Weights   12/25/18 0351 12/26/18 0500 12/27/18 0428  Weight: 95.6 kg 97.8 kg 100.5 kg    Examination:  General:  In bed on vent HENT: NCAT ETT in place PULM: CTA B, vent supported breathing CV: RRR, no mgr GI: BS+, soft, nontender MSK: normal bulk and tone Neuro: heavily sedated on vent, minimally responsive   7/12 bilateral airspace disease, unchanged, ETT in place  Resolved Hospital Problem list     Assessment & Plan:  ARDS due to COVID 19 pneumonia: oxygenation slightly worse 7/12 Continue fentanyl and Versed titrated to an RA SS goal of -2 Has periods of severe ventilator dyssynchrony leading to increased air trapping and vagal response Continue mechanical ventilation per ARDS protocol Target TVol 6-8cc/kgIBW Target Plateau Pressure < 30cm H20 Target driving pressure less than 15 cm of water Target PaO2 55-65: titrate PEEP/FiO2 per protocol As long as PaO2 to FiO2 ratio is less than 1:50 position in prone position for 16  hours a day Check CVP daily if CVL in place Target CVP less than 4, diurese as necessary Ventilator associated pneumonia prevention protocol  New Right upper extremity DVT Start heparin Replace CVL so we can pull PICC  Bradycardia: improved with better vent synchrony Continue telemetry  monitoring Continue efforts for ventilator synchrony  Shock 7/12, with encephalopathy, rising WBC count: bacterial infection? COVID? Add meropenem, vanc Check respiratory culture, check CXR Continue levophed titrated to MAP > 65 Bolus saline now Consider repeat head CT  Acute encephalopathy: worse 7/12, could be due to sedation, new infection? Hold sedation RA SS target 0 Stop clonazepam and quetiapine Continue RASS target -2, continue dilaudid, versed, wean as abl RA SS target -2, continue Dilaudid, Versed, wean as able today Continue clonazepam, quetiapine  AKI : resolved Monitor BMET and UOP Replace electrolytes as needed  Diastolic heart failure Hold lasix tele  Best practice:  Diet: tube feeding Pain/Anxiety/Delirium protocol (if indicated): RASS goal 0, minimize sedation VAP protocol (if indicated): Yes DVT prophylaxis: heparin infusion GI prophylaxis: yes Pantoprazole for stress ulcer prophylaxis Glucose control: SSI Mobility: bed rest Code Status: full Family Communication: per [facility] Disposition: remain in ICU  Labs   CBC: Recent Labs  Lab 12/23/18 0612 12/24/18 0425 12/24/18 1150 12/25/18 0954 12/26/18 0500 12/27/18 0205  WBC 9.5 9.8  --  11.0* 11.2* 16.1*  HGB 11.9* 11.1* 11.9* 10.8* 10.8* 11.8*  HCT 37.6 35.6* 35.0* 36.0 35.4* 37.5  MCV 98.2 100.0  --  100.3* 100.3* 99.7  PLT 176 154  --  133* 110* 127*    Basic Metabolic Panel: Recent Labs  Lab 12/21/18 0450  12/22/18 0510 12/23/18 0612 12/24/18 0425 12/24/18 1150 12/25/18 0441 12/26/18 0500 12/27/18 0205  NA 148*   < > 144 142 144 141 141 136 137  K 3.9   < > 3.6 4.3 4.4 4.2 4.6 4.0 3.8  CL 112*  --  111 109 108  --  106 99 101  CO2 26  --  26 24 26   --  28 29 27   GLUCOSE 240*  --  156* 213* 196*  --  164* 126* 116*  BUN 79*  --  66* 65* 65*  --  63* 65* 62*  CREATININE 1.31*  --  1.07* 1.03* 0.97  --  0.92 0.90 0.89  CALCIUM 8.2*  --  7.9* 8.1* 8.0*  --  8.1* 7.8* 7.5*  MG 2.2  --   2.3  --   --   --   --   --   --    < > = values in this interval not displayed.   GFR: Estimated Creatinine Clearance: 54.9 mL/min (by C-G formula based on SCr of 0.89 mg/dL). Recent Labs  Lab 12/20/18 1330  12/21/18 0450 12/22/18 0510  12/24/18 0425 12/25/18 0954 12/26/18 0500 12/27/18 0205  PROCALCITON <0.10  --  <0.10 <0.10  --   --   --   --   --   WBC  --    < > 7.2 9.1   < > 9.8 11.0* 11.2* 16.1*  LATICACIDVEN 1.3  --   --   --   --   --   --   --   --    < > = values in this interval not displayed.    Liver Function Tests: Recent Labs  Lab 12/21/18 0450 12/22/18 0510 12/23/18 0612 12/24/18 0425  AST 26 27 30 27   ALT 23 19 23 24   ALKPHOS  82 78 84 80  BILITOT 0.2* 0.3 0.3 0.3  PROT 4.5* 4.3* 4.6* 4.4*  ALBUMIN 2.1* 2.0* 2.0* 1.9*   No results for input(s): LIPASE, AMYLASE in the last 168 hours. No results for input(s): AMMONIA in the last 168 hours.  ABG    Component Value Date/Time   PHART 7.394 12/24/2018 1150   PCO2ART 45.0 12/24/2018 1150   PO2ART 64.0 (L) 12/24/2018 1150   HCO3 27.5 12/24/2018 1150   TCO2 29 12/24/2018 1150   ACIDBASEDEF 5.0 (H) 12/15/2018 1501   O2SAT 92.0 12/24/2018 1150     Coagulation Profile: No results for input(s): INR, PROTIME in the last 168 hours.  Cardiac Enzymes: No results for input(s): CKTOTAL, CKMB, CKMBINDEX, TROPONINI in the last 168 hours.  HbA1C: Hgb A1c MFr Bld  Date/Time Value Ref Range Status  12/20/2018 04:06 AM 6.4 (H) 4.8 - 5.6 % Final    Comment:    (NOTE) Pre diabetes:          5.7%-6.4% Diabetes:              >6.4% Glycemic control for   <7.0% adults with diabetes   11/08/2016 07:24 AM 5.6 4.8 - 5.6 % Final    Comment:    (NOTE)         Pre-diabetes: 5.7 - 6.4         Diabetes: >6.4         Glycemic control for adults with diabetes: <7.0     CBG: Recent Labs  Lab 12/26/18 1555 12/26/18 2000 12/27/18 0037 12/27/18 0352 12/27/18 0750  GLUCAP 109* 123* 104* 115* 156*      Critical care time: 38 minutes     Roselie Awkward, MD [place] [facility] Pager: [phone] Cell: [phone] If no response, call [phone]

## 2018-12-27 NOTE — Progress Notes (Signed)
[facility] for heparin > bivalirudin  Indication: atrial fibrillation  Allergies  Allergen Reactions  . Ace Inhibitors Other (See Comments) and Cough    CHEST PAIN  . Heparin Other (See Comments)    HITT  . Other     Patient reports receiving blood after a miscarriage "years ago". She states she broke out from receiving this blood. Has not received any since  . Codeine Nausea And Vomiting  . Levaquin [Levofloxacin] Nausea And Vomiting    Patient Measurements: Height: 4\' 10"  (147.3 cm) Weight: 221 lb 9 oz (100.5 kg) IBW/kg (Calculated) : 40.9 Heparin Dosing Weight: 64 kg   Vital Signs: Temp: 99.3 F (37.4 C) (07/12 0000) Temp Source: Oral (07/12 0000) BP: 80/45 (07/12 0200) Pulse Rate: 66 (07/12 0200)  Labs: Recent Labs    12/24/18 1350 12/25/18 0441 12/25/18 0954 12/25/18 2235 12/26/18 0500 12/26/18 0900 12/26/18 1539 12/26/18 1945 12/27/18 0205  HGB  --   --  10.8*  --  10.8*  --   --   --  11.8*  HCT  --   --  36.0  --  35.4*  --   --   --  37.5  PLT  --   --  133*  --  110*  --   --   --  127*  APTT  --   --   --   --   --   --  81* 118* 43*  HEPARINUNFRC 0.40  --   --  0.87*  --  0.10*  --   --   --   CREATININE  --  0.92  --   --  0.90  --   --   --  0.89    Estimated Creatinine Clearance: 54.9 mL/min (by C-G formula based on SCr of 0.89 mg/dL).   Medical History: Past Medical History:  Diagnosis Date  . 2-vessel coronary artery disease   . Adult hypothyroidism   . Allergic rhinitis 09/12/2016  . Anemia of chronic disease   . Arthritis   . Asthma   . CAD in native artery 05/02/2015   Overview:   S/P PCI and stent x 2. Last cardiac cath August 2010 with mild nonobstructive CAD and normal LV function PCI and stent of proximal [facility] 1999 Cath Nov 2016:Angiographic findings Cardiac Arteries and Lesion Findings LMCA: Normal. LAD: Normal. LCx: Normal. RCA: Abnormal. Lesion on R PDA: Ostial.35% stenosis 5 mm length . Pre  procedure TIMI III flow was noted. Good run off was present.Bifurcation lesion.  Cath 05/08/15:Mild non-obstructive coronary artery disease. Normal LV function  . Cancer ([place]) 03/31/2017  . Chronic diastolic CHF (congestive heart failure) ([place])   . Chronic GERD   . Chronic kidney disease, stage 3 (moderate) ([place])    patient denies (listed in 07/20/16 PCP notes-Dr. Nelda Bucks)  . CKD (chronic kidney disease) 06/05/2015  . Colon polyp   . Cough 09/12/2016  . Depression    since hysterectomy   . Dyspnea 09/12/2016  . Hallucinations 08/17/2013  . Heart attack ([place])   . Hyperlipidemia LDL goal <70   . Hypertension, essential, benign   . Obstructive sleep apnea 09/12/2016  . Polio   . PONV (postoperative nausea and vomiting)   . PONV (postoperative nausea and vomiting)   . 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 Sleep apnea    wears CPAP  . Spondylolisthesis of lumbosacral region 11/07/2016  . Stroke [facility])    "i had a mini  stroke I didn't even know I had it" found on MRI  . Vaginal atrophy 01/30/2016  . Vitamin D deficiency   . Wheezing 09/12/2016    Medications:  Facility-Administered Medications Prior to Admission  Medication Dose Route Frequency Provider Last Rate Last Dose  . Benralizumab SOSY 30 mg  30 mg Subcutaneous Q8 Weeks Kozlow, Donnamarie Poag, MD   30 mg at 10/22/18 1120   Medications Prior to Admission  Medication Sig Dispense Refill Last Dose  . cefUROXime (CEFTIN) 250 MG tablet Take 250 mg by mouth 2 (two) times a day. Take 250mg  by mouth twice daily for seven (7) days     . levothyroxine (SYNTHROID) 100 MCG tablet    unknown  . methylPREDNISolone (MEDROL DOSEPAK) 4 MG TBPK tablet Take 1-6 tablets by mouth See admin instructions. Take 6 tabs by mouth the first day, then decrease dosage by 1 tablet each day until complete.   unknown  . montelukast (SINGULAIR) 10 MG tablet TAKE ONE TABLET BY MOUTH AT BEDTIME (Patient taking differently: Take 10 mg by mouth at bedtime. ) 30  tablet 0 unknown  . nitroGLYCERIN (NITROSTAT) 0.4 MG SL tablet      . promethazine (PHENERGAN) 25 MG tablet Take 25 mg by mouth 4 (four) times daily as needed for nausea or vomiting.    unknown  . TRELEGY ELLIPTA 100-62.5-25 MCG/INH AEPB Inhale 1 Dose into the lungs daily. Rinse, gargle, and spit after use. 22 each 5 unknown    Assessment: 30 YOF diagnosed with COVID. She developed Afib during her ICU stay with CHADSVASc score of 7 (Age, female gender, HTN, stroke, CHF). She was on IV heparin for Afib until 7/9 when she experienced bloody secretions for which heparin was stopped and then resumed on 7/10.   Of note, patient's Plt count has trended down to 110 today. 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 Pharmacy consulted to stop IV heparin and start [place]. H/H remains low stable.   12/27/18  - aPTT 43, slightly subtherapeutic - no bleeding or line issues reported  Goal of Therapy:  aPTT 50-85 seconds Monitor platelets by anticoagulation protocol: Yes   Plan:   Increase bivalirudin to 0.03 mg/kg/hr  Recheck aPTT in 2 hours  Will need to monitor platelet trend closely   Royetta Asal, PharmD, BCPS 12/27/2018 4:55 AM

## 2018-12-27 NOTE — Procedures (Signed)
Central Venous Catheter Insertion Procedure Note Jody Taylor [id number] May 20, 1941  Procedure: Insertion of Central Venous Catheter Indications: Assessment of intravascular volume, Drug and/or fluid administration, Frequent blood sampling and Blood clot in arm with PICC  Procedure Details Consent: Risks of procedure as well as the alternatives and risks of each were explained to the (patient/caregiver).  Consent for procedure obtained. Time Out: Verified patient identification, verified procedure, site/side was marked, verified correct patient position, special equipment/implants available, medications/allergies/relevent history reviewed, required imaging and test results available.  Performed  Maximum sterile technique was used including antiseptics, cap, gloves, gown, hand hygiene, mask and sheet. Skin prep: Chlorhexidine; local anesthetic administered A antimicrobial bonded/coated triple lumen catheter was placed in the left internal jugular vein using the Seldinger technique.  Ultrasound was used to verify the patency of the vein and for real time needle guidance.  Evaluation Blood flow good Complications: No apparent complications Patient did tolerate procedure well. Chest X-ray ordered to verify placement.  CXR: pending.  Simonne Maffucci 12/27/2018, 6:34 PM

## 2018-12-27 NOTE — Progress Notes (Signed)
PROGRESS NOTE  Jody Taylor EZM:[id number] DOB: March 08, 1941 DOA: 11/19/2018  PCP: Nicoletta Dress, MD  Brief History/Interval Summary: Patient is a 78 y.o. female with PMHx of HTN, CAD, chronic diastolic heart failure, severe persistent asthma on benralizumab, hypothyroidism, CKD stage III-presented to [facility] with acute respiratory distress-was found to have acute hypoxic respiratory failure secondary to COVID-19 pneumonia and emergently intubated and subsequently transferred to [facility] course complicated by ventilator dyssynchrony requiring prn paralytics, atrial fibrillation-and concern for encephalopathy as she has been at times not responsive even when off sedation.  See below for further details  Reason for Visit: Acute respiratory disease due to COVID-19  Consultants: Pulmonology  Procedures:  Intubation.   Right internal jugular central venous catheter placement 6/29  Transthoracic echocardiogram 1. The left ventricle has normal systolic function with an ejection fraction of 60-65%. The cavity size was normal. There is moderate asymmetric left ventricular hypertrophy. Left ventricular diastolic parameters were normal.  2. The right ventricle has normal systolic function. The cavity was normal. There is no increase in right ventricular wall thickness.  3. No evidence of mitral valve stenosis.  4. No stenosis of the aortic valve.  5. The aortic root and ascending aorta are normal in size and structure.  6. The interatrial septum was not assessed.   Antibiotics: Anti-infectives (From admission, onward)   Start     Dose/Rate Route Frequency Ordered Stop   12/29/18 0000  vancomycin (VANCOCIN) 1,500 mg in sodium chloride 0.9 % 500 mL IVPB     1,500 mg 250 mL/hr over 120 Minutes Intravenous Every 36 hours 12/27/18 1254     12/27/18 1130  vancomycin (VANCOCIN) 2,000 mg in sodium chloride 0.9 % 500 mL IVPB     2,000 mg 250 mL/hr over 120 Minutes  Intravenous  Once 12/27/18 1048     12/27/18 1130  meropenem (MERREM) 1 g in sodium chloride 0.9 % 100 mL IVPB     1 g 200 mL/hr over 30 Minutes Intravenous Every 8 hours 12/27/18 1048     12/20/18 1230  linezolid (ZYVOX) IVPB 600 mg  Status:  Discontinued     600 mg 300 mL/hr over 60 Minutes Intravenous Every 12 hours 12/20/18 1210 12/21/18 1227   12/20/18 1230  ceFEPIme (MAXIPIME) 2 g in sodium chloride 0.9 % 100 mL IVPB  Status:  Discontinued     2 g 200 mL/hr over 30 Minutes Intravenous Every 12 hours 12/20/18 1229 12/22/18 1113   12/15/18 2000  remdesivir 100 mg in sodium chloride 0.9 % 250 mL IVPB     100 mg 500 mL/hr over 30 Minutes Intravenous Every 24 hours 12/13/2018 1845 12/18/18 2053   12/04/2018 2000  remdesivir 200 mg in sodium chloride 0.9 % 250 mL IVPB     200 mg 500 mL/hr over 30 Minutes Intravenous Once 11/16/2018 1845 11/30/2018 2116       Subjective/Interval History: Patient remains intubated and on mechanical ventilation.  Requiring more oxygen.  Also became hypotensive during the course of the night.      Assessment/Plan:  Acute Hypoxic Resp. Failure due to Acute Covid 19 Viral Illness/ARDS  Vent Mode: PRVC FiO2 (%):  [60 %-70 %] 60 % Set Rate:  [24 bmp] 24 bmp Vt Set:  [400 mL] 400 mL PEEP:  [8 cmH20] 8 cmH20 Plateau Pressure:  [21 TKP54-65 cmH20] 21 cmH20     Component Value Date/Time   PHART 7.394 12/24/2018 1150   PCO2ART  45.0 12/24/2018 1150   PO2ART 64.0 (L) 12/24/2018 1150   HCO3 27.5 12/24/2018 1150   TCO2 29 12/24/2018 1150   ACIDBASEDEF 5.0 (H) 12/15/2018 1501   O2SAT 92.0 12/24/2018 1150    COVID-19 Labs  Recent Labs    12/25/18 0441 12/26/18 0500  DDIMER 8.85* 15.77*     Fever: Low-grade fever noted Oxygen requirements: On mechanical ventilation.  60% FiO2.  Saturating in the early 90s.     Antibiotics: Started on vancomycin and meropenem today.  Previously was on cefepime and linezolid for 3 days. Remdesivir: Completed course of  Remdesivir Steroids: Completed course of steroids. Diuretics: He was given Lasix for 3 days but not in the last 24 hours due to hypotension Actemra: Received Actemra on 6/29 at [facility] Plasma: Received convalescent plasma on 6/30 Vitamin C and Zinc: Continue DVT Prophylaxis: Continue IV heparin.    Patient remains intubated and on mechanical ventilation.  She appears to have had some setback over the last 12 hours.  She is requiring more FiO2.  She also became hypotensive.  Her WBC increased as well.  Low-grade fevers noted.  Concern is for some kind of occult infection.  Repeat chest x-ray ordered.  No significant change noted compared to yesterday.  Tracheal aspirate cultures ordered.  Patient has been started on vancomycin and meropenem.  Previously she received a linezolid and cefepime for 2 to 3 days but this was discontinued due to normal procalcitonin level.  We will check procalcitonin levels tomorrow.  Thrombocytopenia Platelet count stable this morning.  HIT panel is pending.  Heparin was changed over to Angiomax.    Acute metabolic encephalopathy Encephalopathy is most likely due to acute illness.  CT head done on 7/6 did not show any acute findings.  Continue to monitor  Steroid-induced hyperglycemia in the setting of known prediabetes HbA1c 6.4.  CBGs have been trending down as steroid was tapered.  Continue Levemir.  May need to decrease the dose.  Continue SSI.    Hypotension Patient is requiring Levophed.  Monitor closely.    Hypernatremia Resolved with free water.  History of paroxysmal atrial fibrillation/episodes of bradycardia Noted on telemetry on 7/7.  Patient started on IV heparin.  Echocardiogram did not show any concerning findings.  TSH and free T4 are normal.  Due to thrombocytopenia heparin was changed over to Angiomax.  No further episodes of bradycardia.  She became bradycardic on 7/9 due to vagal stimulation while being suctioned.  Had to  be given atropine.  Elevated d-dimer Lower extremity Doppler study on 7/6 were negative for DVT.  Elevated d-dimer most likely due to COVID-19.  D-dimer continues to worsen.  Up to 15.77.  Recheck it tomorrow.  Her right upper extremity noted to be swollen.  Doppler study ordered.  Chronic kidney disease stage III Renal function remains close to baseline.  She has had good diuresis with Lasix.  Continue to monitor closely.    History of coronary artery disease Continue aspirin.  History of chronic diastolic CHF Patient had significant weight gain in the hospital.  This has been improving with diuresis.    History of severe persistent asthma No wheezing noted.  Patient was on benralizumab as outpatient.  History of dyslipidemia Continue statin.  History of hypothyroidism Continue levothyroxine.  TSH and free T4 were normal.  History of GERD Continue PPI  History of anxiety and depression Stable.  Positive urine cultures Urine culture grew staph epidermidis.  Blood cultures negative.  Patient's  procalcitonin level was less than 0.1.  Patient was given cefepime and linezolid for about 3 days.  Due to clinical stability patient was not given any additional antibiotics.    Nutrition Continue tube feedings.   DVT Prophylaxis: On Angiomax PUD Prophylaxis: Protonix Code Status: DNR Family Communication: Discussed with son on a daily basis Disposition Plan: Remain in ICU   Medications:  Scheduled: . sodium chloride   Intravenous Once  . aspirin  81 mg Per Tube Daily  . chlorhexidine  15 mL Mouth/Throat BID  . Chlorhexidine Gluconate Cloth  6 each Topical Daily  . clonazePAM  1 mg Per Tube BID  . free water  350 mL Per Tube Q4H  . insulin aspart  0-15 Units Subcutaneous Q4H  . insulin detemir  20 Units Subcutaneous Daily  . levothyroxine  50 mcg Intravenous Daily  . mouth rinse  15 mL Mouth Rinse 10 times per day  . pantoprazole sodium  40 mg Per Tube Daily  . QUEtiapine   25 mg Per Tube BID  . sodium chloride flush  10-40 mL Intracatheter Q12H  . vitamin C  500 mg Per Tube Daily  . zinc sulfate  220 mg Per Tube Daily   Continuous: . sodium chloride Stopped (12/27/18 0340)  . bivalirudin (ANGIOMAX) infusion 0.5 mg/mL (Non-ACS indications) 0.03 mg/kg/hr (12/27/18 1300)  . dexmedetomidine (PRECEDEX) IV infusion    . feeding supplement (VITAL HIGH PROTEIN) 1,000 mL (12/26/18 1645)  . HYDROmorphone 1 mg/hr (12/27/18 1200)  . meropenem (MERREM) IV 1 g (12/27/18 1218)  . midazolam 0.5 mg/hr (12/27/18 0900)  . norepinephrine (LEVOPHED) Adult infusion 2 mcg/min (12/27/18 0341)  . [START ON 12/29/2018] vancomycin    . vancomycin 2,000 mg (12/27/18 1230)   GYJ:EHUDJSHFWYOVZ, atropine, hydrALAZINE, HYDROmorphone, ipratropium-albuterol, midazolam, nitroGLYCERIN, [DISCONTINUED] ondansetron **OR** ondansetron (ZOFRAN) IV, polyethylene glycol   Objective:  Vital Signs  Vitals:   12/27/18 0741 12/27/18 0800 12/27/18 1150 12/27/18 1200  BP: (!) 95/47 (!) 93/46 92/60 (!) 94/58  Pulse: 86 85 (!) 115 (!) 115  Resp: (!) 28 (!) 23 (!) 28 (!) 23  Temp:  99.4 F (37.4 C)  99.6 F (37.6 C)  TempSrc:  Axillary  Axillary  SpO2: 91% (!) 88% 90% 90%  Weight:      Height:        Intake/Output Summary (Last 24 hours) at 12/27/2018 1402 Last data filed at 12/27/2018 1300 Gross per 24 hour  Intake 4375.75 ml  Output 1705 ml  Net 2670.75 ml   Filed Weights   12/25/18 0351 12/26/18 0500 12/27/18 0428  Weight: 95.6 kg 97.8 kg 100.5 kg   General appearance: Intubated sedated Resp: Coarse breath sounds bilaterally.  Crackles at the bases.  No wheezing or rhonchi.   Cardio: S1-S2 is irregularly irregular.  Telemetry shows atrial fibrillation GI: Abdomen is soft.  Nontender nondistended.  Bowel sounds are present normal.  No masses organomegaly Extremities: Right upper extremity noted to be more swollen than the left. Neurologic: Sedated.  Pupils are equal.  Sluggish  reaction to light.     Lab Results:  Data Reviewed: I have personally reviewed following labs and imaging studies  CBC: Recent Labs  Lab 12/23/18 0612 12/24/18 0425 12/24/18 1150 12/25/18 0954 12/26/18 0500 12/27/18 0205  WBC 9.5 9.8  --  11.0* 11.2* 16.1*  HGB 11.9* 11.1* 11.9* 10.8* 10.8* 11.8*  HCT 37.6 35.6* 35.0* 36.0 35.4* 37.5  MCV 98.2 100.0  --  100.3* 100.3* 99.7  PLT 176 154  --  133* 110* 127*    Basic Metabolic Panel: Recent Labs  Lab 12/21/18 0450  12/22/18 0510 12/23/18 0612 12/24/18 0425 12/24/18 1150 12/25/18 0441 12/26/18 0500 12/27/18 0205 12/27/18 0500  NA 148*   < > 144 142 144 141 141 136 137  --   K 3.9   < > 3.6 4.3 4.4 4.2 4.6 4.0 3.8  --   CL 112*  --  111 109 108  --  106 99 101  --   CO2 26  --  26 24 26   --  28 29 27   --   GLUCOSE 240*  --  156* 213* 196*  --  164* 126* 116*  --   BUN 79*  --  66* 65* 65*  --  63* 65* 62*  --   CREATININE 1.31*  --  1.07* 1.03* 0.97  --  0.92 0.90 0.89  --   CALCIUM 8.2*  --  7.9* 8.1* 8.0*  --  8.1* 7.8* 7.5*  --   MG 2.2  --  2.3  --   --   --   --   --   --  2.2   < > = values in this interval not displayed.    GFR: Estimated Creatinine Clearance: 54.9 mL/min (by C-G formula based on SCr of 0.89 mg/dL).  Liver Function Tests: Recent Labs  Lab 12/21/18 0450 12/22/18 0510 12/23/18 0612 12/24/18 0425  AST 26 27 30 27   ALT 23 19 23 24   ALKPHOS 82 78 84 80  BILITOT 0.2* 0.3 0.3 0.3  PROT 4.5* 4.3* 4.6* 4.4*  ALBUMIN 2.1* 2.0* 2.0* 1.9*    CBG: Recent Labs  Lab 12/26/18 2000 12/27/18 0037 12/27/18 0352 12/27/18 0750 12/27/18 1141  GLUCAP 123* 104* 115* 156* 189*    Anemia Panel: No results for input(s): VITAMINB12, FOLATE, FERRITIN, TIBC, IRON, RETICCTPCT in the last 72 hours.  Recent Results (from the past 240 hour(s))  Culture, respiratory (non-expectorated)     Status: None   Collection Time: 12/20/18 12:03 PM   Specimen: Tracheal Aspirate; Respiratory  Result Value Ref  Range Status   Specimen Description   Final    TRACHEAL ASPIRATE Performed at [facility] [address] [place], [place] [address]    Special Requests   Final    Immunocompromised Performed at [facility], [place] [address], [place], [place] [address]    Gram Stain   Final    FEW WBC PRESENT, PREDOMINANTLY PMN RARE SQUAMOUS EPITHELIAL CELLS PRESENT MODERATE GRAM POSITIVE COCCI IN CLUSTERS RARE GRAM POSITIVE RODS RARE BUDDING YEAST SEEN    Culture   Final    FEW Consistent with normal respiratory flora. Performed at [facility], [place] [address], [place], [place] [address]    Report Status 12/23/2018 FINAL  Final  Culture, Urine     Status: Abnormal   Collection Time: 12/20/18 12:03 PM   Specimen: Urine, Clean Catch  Result Value Ref Range Status   Specimen Description   Final    URINE, CLEAN CATCH Performed at [facility], [place] [address] [place], [place] [address]    Special Requests   Final    Immunocompromised Performed at [facility], [place] [address], [place], [place] [address]    Culture >=100,000 COLONIES/mL STAPHYLOCOCCUS EPIDERMIDIS (A)  Final   Report Status 12/22/2018 FINAL  Final   Organism ID, Bacteria STAPHYLOCOCCUS EPIDERMIDIS (A)  Final  Susceptibility   Staphylococcus epidermidis - MIC*    CIPROFLOXACIN >=8 RESISTANT Resistant     GENTAMICIN <=0.5 SENSITIVE Sensitive     NITROFURANTOIN <=16 SENSITIVE Sensitive     OXACILLIN >=4 RESISTANT Resistant     TETRACYCLINE <=1 SENSITIVE Sensitive     VANCOMYCIN 2 SENSITIVE Sensitive     TRIMETH/SULFA <=10 SENSITIVE Sensitive     CLINDAMYCIN <=0.25 SENSITIVE Sensitive     RIFAMPIN <=0.5 SENSITIVE Sensitive     Inducible Clindamycin NEGATIVE Sensitive     * >=100,000 COLONIES/mL STAPHYLOCOCCUS EPIDERMIDIS  Culture, blood (Routine X 2) w Reflex to ID Panel     Status: None   Collection Time: 12/20/18 12:40 PM   Specimen:  BLOOD  Result Value Ref Range Status   Specimen Description   Final    BLOOD LEFT ANTECUBITAL Performed at [facility] [address], [place], [place] [address]    Special Requests   Final    BOTTLES DRAWN AEROBIC ONLY Blood Culture adequate volume Performed at [facility] [address] [place], [place] [address]    Culture   Final    NO GROWTH 5 DAYS Performed at [facility], [place] [address], [place], [place] [address]    Report Status 12/25/2018 FINAL  Final  Culture, blood (Routine X 2) w Reflex to ID Panel     Status: None   Collection Time: 12/20/18 12:45 PM   Specimen: BLOOD  Result Value Ref Range Status   Specimen Description   Final    BLOOD LEFT HAND Performed at [facility] [address] [place], [place] [address]    Special Requests   Final    BOTTLES DRAWN AEROBIC ONLY Blood Culture adequate volume Performed at [facility] [address] [place], [place] [address]    Culture   Final    NO GROWTH 5 DAYS Performed at [facility], [place] [address], [place], [place] [address]    Report Status 12/25/2018 FINAL  Final      Radiology Studies: Dg Chest Port 1 View  Result Date: 12/27/2018 CLINICAL DATA:  Acute respiratory failure with hypoxemia. COVID-19 infection. On ventilator. EXAM: PORTABLE CHEST 1 VIEW COMPARISON:  12/26/2018 FINDINGS: Endotracheal tube, feeding tube, and right arm PICC line remain in satisfactory position. Heart size is within normal limits. Diffuse bilateral airspace disease is again seen, without significant change. No evidence of pneumothorax or pleural effusion. IMPRESSION: Diffuse bilateral airspace disease, without significant change. Electronically Signed   By: Marlaine Hind M.D.   On: 12/27/2018 11:32   Dg Chest Port 1 View  Result Date: 12/26/2018 CLINICAL DATA:  Pneumonia due to COVID-19 virus. EXAM: PORTABLE CHEST 1 VIEW COMPARISON:   12/24/2018 and older exams. FINDINGS: Patchy bilateral airspace lung opacities with more diffuse interstitial type opacities are without change from the prior study. No new lung abnormalities. No convincing pleural effusion.  No pneumothorax. Endotracheal tube, nasal/orogastric tube and right PICC are stable. IMPRESSION: 1. No change from the most recent prior exam. 2. Persistent bilateral lung opacities consistent with multifocal pneumonia. 3. Stable support apparatus. Electronically Signed   By: Lajean Manes M.D.   On: 12/26/2018 09:11       LOS: 13 days   Laiden Milles [employer] Pager on [email or web address]  12/27/2018, 2:02 PM

## 2018-12-27 NOTE — Progress Notes (Signed)
Pharmacy Brief Note  O:  APTT=35 Goal aPTT 50-85  A/P:  APTT is below goal on bivalirudin. Patient now has confirmed UE DVT and HIT antibody pending. No bleeding or line issues per RN. Will increase bivalirudin to 0.05 mg/kg/hr and repeat aPTT in 2 hours.   Ulice Dash, PharmD, BCPS Clinical Pharmacist

## 2018-12-27 NOTE — Progress Notes (Signed)
FaceTimed with pt's daughter and granddaughter.

## 2018-12-27 NOTE — Progress Notes (Signed)
Spoke w/ Margaretha Sheffield over the phone. No new updates to give her since [place] spoke w/ her earlier today. Margaretha Sheffield voiced appreciation for everything we are doing for her mother & to keep her updated if anything changes.

## 2018-12-27 NOTE — Progress Notes (Signed)
VASCULAR LAB PRELIMINARY  PRELIMINARY  PRELIMINARY  PRELIMINARY  Right upper extremity venous duplex completed.    Preliminary report:  See CV proc for preliminary results.  Gave Dr. Lake Bells results.   Charistopher Rumble, RVT 12/27/2018, 2:33 PM

## 2018-12-27 NOTE — Progress Notes (Addendum)
[facility] NOTE  Pharmacy Consult for bivalirudin; vancomycin/meropenem Indication: atrial fibrillation/ r/o HIT; sepsis/ ?HCAP   Allergies  Allergen Reactions  . Ace Inhibitors Other (See Comments) and Cough    CHEST PAIN  . Heparin Other (See Comments)    HITT  . Other     Patient reports receiving blood after a miscarriage "years ago". She states she broke out from receiving this blood. Has not received any since  . Codeine Nausea And Vomiting  . Levaquin [Levofloxacin] Nausea And Vomiting    Patient Measurements: Height: 4\' 10"  (147.3 cm) Weight: 221 lb 9 oz (100.5 kg) IBW/kg (Calculated) : 40.9 Heparin Dosing Weight: 64 kg   Vital Signs: Temp: 99.4 F (37.4 C) (07/12 0800) Temp Source: Axillary (07/12 0800) BP: 93/46 (07/12 0800) Pulse Rate: 85 (07/12 0800)  Labs: Recent Labs    12/24/18 1350 12/25/18 0441 12/25/18 0954 12/25/18 2235 12/26/18 0500 12/26/18 0900  12/26/18 1945 12/27/18 0205 12/27/18 0550  HGB  --   --  10.8*  --  10.8*  --   --   --  11.8*  --   HCT  --   --  36.0  --  35.4*  --   --   --  37.5  --   PLT  --   --  133*  --  110*  --   --   --  127*  --   APTT  --   --   --   --   --   --    < > 118* 43* 67*  HEPARINUNFRC 0.40  --   --  0.87*  --  0.10*  --   --   --   --   CREATININE  --  0.92  --   --  0.90  --   --   --  0.89  --    < > = values in this interval not displayed.    Estimated Creatinine Clearance: 54.9 mL/min (by C-G formula based on SCr of 0.89 mg/dL).   Medical History: Past Medical History:  Diagnosis Date  . 2-vessel coronary artery disease   . Adult hypothyroidism   . Allergic rhinitis 09/12/2016  . Anemia of chronic disease   . Arthritis   . Asthma   . CAD in native artery 05/02/2015   Overview:   S/P PCI and stent x 2. Last cardiac cath August 2010 with mild nonobstructive CAD and normal LV function PCI and stent of proximal [facility] 1999 Cath Nov 2016:Angiographic findings Cardiac Arteries  and Lesion Findings LMCA: Normal. LAD: Normal. LCx: Normal. RCA: Abnormal. Lesion on R PDA: Ostial.35% stenosis 5 mm length . Pre procedure TIMI III flow was noted. Good run off was present.Bifurcation lesion.  Cath 05/08/15:Mild non-obstructive coronary artery disease. Normal LV function  . Cancer ([place]) 03/31/2017  . Chronic diastolic CHF (congestive heart failure) ([place])   . Chronic GERD   . Chronic kidney disease, stage 3 (moderate) ([place])    patient denies (listed in 07/20/16 PCP notes-Dr. Nelda Bucks)  . CKD (chronic kidney disease) 06/05/2015  . Colon polyp   . Cough 09/12/2016  . Depression    since hysterectomy   . Dyspnea 09/12/2016  . Hallucinations 08/17/2013  . Heart attack ([place])   . Hyperlipidemia LDL goal <70   . Hypertension, essential, benign   . Obstructive sleep apnea 09/12/2016  . Polio   . PONV (postoperative nausea and vomiting)   . PONV (postoperative nausea  and vomiting)   . 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 Sleep apnea    wears CPAP  . Spondylolisthesis of lumbosacral region 11/07/2016  . Stroke [facility])    "i had a mini stroke I didn't even know I had it" found on MRI  . Vaginal atrophy 01/30/2016  . Vitamin D deficiency   . Wheezing 09/12/2016    Medications:  Facility-Administered Medications Prior to Admission  Medication Dose Route Frequency Provider Last Rate Last Dose  . Benralizumab SOSY 30 mg  30 mg Subcutaneous Q8 Weeks Kozlow, Donnamarie Poag, MD   30 mg at 10/22/18 1120   Medications Prior to Admission  Medication Sig Dispense Refill Last Dose  . cefUROXime (CEFTIN) 250 MG tablet Take 250 mg by mouth 2 (two) times a day. Take 250mg  by mouth twice daily for seven (7) days     . levothyroxine (SYNTHROID) 100 MCG tablet    unknown  . methylPREDNISolone (MEDROL DOSEPAK) 4 MG TBPK tablet Take 1-6 tablets by mouth See admin instructions. Take 6 tabs by mouth the first day, then decrease dosage by 1 tablet each day until complete.   unknown  .  montelukast (SINGULAIR) 10 MG tablet TAKE ONE TABLET BY MOUTH AT BEDTIME (Patient taking differently: Take 10 mg by mouth at bedtime. ) 30 tablet 0 unknown  . nitroGLYCERIN (NITROSTAT) 0.4 MG SL tablet      . promethazine (PHENERGAN) 25 MG tablet Take 25 mg by mouth 4 (four) times daily as needed for nausea or vomiting.    unknown  . TRELEGY ELLIPTA 100-62.5-25 MCG/INH AEPB Inhale 1 Dose into the lungs daily. Rinse, gargle, and spit after use. 56 each 5 unknown    Assessment: 69 YOF diagnosed with COVID. She developed Afib during her ICU stay with CHADSVASc score of 7 (Age, female gender, HTN, stroke, CHF). She was on IV heparin for Afib until 7/9 when she experienced bloody secretions for which heparin was stopped and then resumed on 7/10.   AC: On 7/11, patient's Plt count had trended down to 110 today. 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 Pharmacy consulted to stop IV heparin and start DTI.  12/27/18  - aPTT 67, therapeutic - H/H improved, Plt improved today  - Some right arm swelling - upper extremity duplex ordered  - no bleeding or line issues reported  ID: Now restarting antibiotics with vancomycin and meropenem due to concern for pneumonia. Worsening leukocytosis with dropping blood pressures requiring vasopressors.   Goal of Therapy:  aPTT 50-85 seconds Monitor platelets by anticoagulation protocol: Yes   Plan:  -Continue bivalirudin to 0.03 mg/kg/hr -Recheck aPTT in 2 hours -Will need to monitor platelet trend closely -HIT panel pending   -Vancomycin 2 gm IV once, then start Vancomycin 1500 mg IV Q 36 hrs. Goal AUC 400-550. Expected AUC: 501 SCr used: 0.89 -Meropenem 1 gm IV Q 8 hours -Monitor CBC, renal fx, cultures and clinical progress   Albertina Parr, PharmD., BCPS Clinical Pharmacist Clinical phone for 12/27/18 until 5pm: [phone]    Addendum: Repeat aPTT obtained from a peripheral stick is now subtherapeutic at 25. Increase bivalirudin to 0.04 mg/kg/hr and  repeat 2 hr aPTT   Albertina Parr, PharmD., BCPS Clinical Pharmacist

## 2018-12-27 NOTE — Progress Notes (Signed)
Pt's daughter, Margaretha Sheffield, called and was updated on pt's condition, including her increased oxygen demands and need for pressor support overnight. Questions answered regarding Ms. Burnham's oxygen needs on the vent, and her upcoming UE Venous Doppler. Margaretha Sheffield expressed gratitude for all the care her mom is receiving here. Will plan a FaceTime call with her later this afternoon.

## 2018-12-28 LAB — POCT I-STAT 7, (LYTES, BLD GAS, ICA,H+H)
Acid-base deficit: 2 mmol/L (ref 0.0–2.0)
Bicarbonate: 27.3 mmol/L (ref 20.0–28.0)
Calcium, Ion: 1.25 mmol/L (ref 1.15–1.40)
HCT: 31 % — ABNORMAL LOW (ref 36.0–46.0)
Hemoglobin: 10.5 g/dL — ABNORMAL LOW (ref 12.0–15.0)
O2 Saturation: 93 %
Patient temperature: 100.1
Potassium: 4.9 mmol/L (ref 3.5–5.1)
Sodium: 133 mmol/L — ABNORMAL LOW (ref 135–145)
TCO2: 30 mmol/L (ref 22–32)
pCO2 arterial: 78.6 mmHg (ref 32.0–48.0)
pH, Arterial: 7.154 — CL (ref 7.350–7.450)
pO2, Arterial: 91 mmHg (ref 83.0–108.0)

## 2018-12-28 LAB — D-DIMER, QUANTITATIVE: D-Dimer, Quant: 12.46 ug/mL-FEU — ABNORMAL HIGH (ref 0.00–0.50)

## 2018-12-28 LAB — CBC
HCT: 38.7 % (ref 36.0–46.0)
HCT: 33.7 % — ABNORMAL LOW (ref 36.0–46.0)
Hemoglobin: 12.4 g/dL (ref 12.0–15.0)
Hemoglobin: 10.7 g/dL — ABNORMAL LOW (ref 12.0–15.0)
MCH: 31.6 pg (ref 26.0–34.0)
MCH: 32.2 pg (ref 26.0–34.0)
MCHC: 32 g/dL (ref 30.0–36.0)
MCHC: 31.8 g/dL (ref 30.0–36.0)
MCV: 98.5 fL (ref 80.0–100.0)
MCV: 101.5 fL — ABNORMAL HIGH (ref 80.0–100.0)
Platelets: 163 10*3/uL (ref 150–400)
Platelets: 126 10*3/uL — ABNORMAL LOW (ref 150–400)
RBC: 3.93 MIL/uL (ref 3.87–5.11)
RBC: 3.32 MIL/uL — ABNORMAL LOW (ref 3.87–5.11)
RDW: 15.9 % — ABNORMAL HIGH (ref 11.5–15.5)
RDW: 16.7 % — ABNORMAL HIGH (ref 11.5–15.5)
WBC: 33.1 10*3/uL — ABNORMAL HIGH (ref 4.0–10.5)
WBC: 26.6 10*3/uL — ABNORMAL HIGH (ref 4.0–10.5)
nRBC: 0.3 % — ABNORMAL HIGH (ref 0.0–0.2)
nRBC: 0.1 % (ref 0.0–0.2)

## 2018-12-28 LAB — GLUCOSE, CAPILLARY
Glucose-Capillary: 192 mg/dL — ABNORMAL HIGH (ref 70–99)
Glucose-Capillary: 185 mg/dL — ABNORMAL HIGH (ref 70–99)
Glucose-Capillary: 151 mg/dL — ABNORMAL HIGH (ref 70–99)
Glucose-Capillary: 192 mg/dL — ABNORMAL HIGH (ref 70–99)
Glucose-Capillary: 197 mg/dL — ABNORMAL HIGH (ref 70–99)

## 2018-12-28 LAB — APTT
aPTT: 45 seconds — ABNORMAL HIGH (ref 24–36)
aPTT: 49 seconds — ABNORMAL HIGH (ref 24–36)
aPTT: 42 seconds — ABNORMAL HIGH (ref 24–36)

## 2018-12-28 LAB — BASIC METABOLIC PANEL
Anion gap: 12 (ref 5–15)
BUN: 68 mg/dL — ABNORMAL HIGH (ref 8–23)
CO2: 23 mmol/L (ref 22–32)
Calcium: 8.2 mg/dL — ABNORMAL LOW (ref 8.9–10.3)
Chloride: 102 mmol/L (ref 98–111)
Creatinine, Ser: 1.14 mg/dL — ABNORMAL HIGH (ref 0.44–1.00)
GFR calc Af Amer: 54 mL/min — ABNORMAL LOW (ref >60)
GFR calc non Af Amer: 47 mL/min — ABNORMAL LOW (ref >60)
Glucose, Bld: 188 mg/dL — ABNORMAL HIGH (ref 70–99)
Potassium: 4.8 mmol/L (ref 3.5–5.1)
Sodium: 137 mmol/L (ref 135–145)

## 2018-12-28 LAB — HEPARIN INDUCED PLATELET AB (HIT ANTIBODY): Heparin Induced Plt Ab: 0.098 OD (ref 0.000–0.400)

## 2018-12-28 LAB — PROCALCITONIN: Procalcitonin: 0.61 ng/mL

## 2018-12-28 MED ORDER — VECURONIUM BROMIDE 10 MG IV SOLR
0.0000 ug/kg/min | INTRAVENOUS | Status: DC
  Administered 2018-12-28: 12:00:00 1 ug/kg/min via INTRAVENOUS
  Administered 2018-12-28: 1.3 ug/kg/min via INTRAVENOUS
  Filled 2018-12-28 (×3): qty 100

## 2018-12-28 MED ORDER — VANCOMYCIN HCL IN DEXTROSE 1-5 GM/200ML-% IV SOLN
1000.0000 mg | INTRAVENOUS | Status: DC
  Administered 2018-12-28: 1000 mg via INTRAVENOUS
  Filled 2018-12-28: qty 200

## 2018-12-28 MED ORDER — ROCURONIUM BROMIDE 10 MG/ML (PF) SYRINGE
80.0000 mg | PREFILLED_SYRINGE | Freq: Once | INTRAVENOUS | Status: AC
  Administered 2018-12-28: 10:00:00 80 mg via INTRAVENOUS

## 2018-12-28 MED ORDER — SODIUM CHLORIDE 0.9 % IV SOLN
0.5000 mg/h | INTRAVENOUS | Status: DC
  Administered 2018-12-28: 6 mg/h via INTRAVENOUS
  Administered 2018-12-28 – 2018-12-30 (×3): 5 mg/h via INTRAVENOUS
  Administered 2018-12-30: 3 mg/h via INTRAVENOUS
  Administered 2018-12-31: 01:00:00 5 mg/h via INTRAVENOUS
  Administered 2018-12-31: 4 mg/h via INTRAVENOUS
  Filled 2018-12-28 (×12): qty 5

## 2018-12-28 MED ORDER — METOPROLOL TARTRATE 5 MG/5ML IV SOLN
5.0000 mg | Freq: Once | INTRAVENOUS | Status: AC
  Administered 2018-12-28: 2.5 mg via INTRAVENOUS
  Filled 2018-12-28: qty 5

## 2018-12-28 MED ORDER — METOPROLOL TARTRATE 5 MG/5ML IV SOLN
2.5000 mg | Freq: Four times a day (QID) | INTRAVENOUS | Status: DC | PRN
  Administered 2018-12-30 – 2018-12-31 (×4): 2.5 mg via INTRAVENOUS
  Filled 2018-12-28 (×4): qty 5

## 2018-12-28 MED ORDER — VECURONIUM BOLUS VIA INFUSION
0.0800 mg/kg | Freq: Once | INTRAVENOUS | Status: AC
  Administered 2018-12-28: 8.2 mg via INTRAVENOUS
  Filled 2018-12-28: qty 9

## 2018-12-28 MED ORDER — ROCURONIUM BROMIDE 10 MG/ML (PF) SYRINGE
PREFILLED_SYRINGE | INTRAVENOUS | Status: AC
  Administered 2018-12-28: 10:00:00 80 mg via INTRAVENOUS
  Filled 2018-12-28: qty 10

## 2018-12-28 MED ORDER — NOREPINEPHRINE 4 MG/250ML-% IV SOLN: 0.0000 ug/min | INTRAVENOUS | Status: DC

## 2018-12-28 MED ORDER — MIDAZOLAM 50MG/50ML (1MG/ML) PREMIX INFUSION
2.0000 mg/h | INTRAVENOUS | Status: DC
  Administered 2018-12-28: 8 mg/h via INTRAVENOUS
  Administered 2018-12-28 – 2018-12-29 (×3): 6 mg/h via INTRAVENOUS
  Administered 2018-12-29: 4 mg/h via INTRAVENOUS
  Administered 2018-12-30: 6 mg/h via INTRAVENOUS
  Administered 2018-12-30: 2 mg/h via INTRAVENOUS
  Administered 2018-12-31: 5 mg/h via INTRAVENOUS
  Administered 2018-12-31: 2 mg/h via INTRAVENOUS
  Filled 2018-12-28 (×9): qty 50

## 2018-12-28 MED ORDER — HYDROMORPHONE HCL 1 MG/ML IJ SOLN: 0.5000 mg | Freq: Once | INTRAMUSCULAR | Status: DC

## 2018-12-28 MED ORDER — MIDAZOLAM BOLUS VIA INFUSION
1.0000 mg | INTRAVENOUS | Status: DC | PRN
  Administered 2018-12-30 – 2019-01-01 (×2): 2 mg via INTRAVENOUS
  Filled 2018-12-28: qty 2

## 2018-12-28 MED ORDER — SODIUM CHLORIDE 0.9 % IV SOLN
0.0600 mg/kg/h | INTRAVENOUS | Status: DC
  Filled 2018-12-28: qty 250

## 2018-12-28 MED ORDER — ARTIFICIAL TEARS OPHTHALMIC OINT
1.0000 "application " | TOPICAL_OINTMENT | Freq: Three times a day (TID) | OPHTHALMIC | Status: DC
  Administered 2018-12-28 – 2018-12-31 (×11): 1 via OPHTHALMIC
  Filled 2018-12-28 (×2): qty 3.5

## 2018-12-28 MED ORDER — SODIUM CHLORIDE 0.9 % IV SOLN
1.0000 g | Freq: Two times a day (BID) | INTRAVENOUS | Status: DC
  Administered 2018-12-28 – 2018-12-31 (×7): 1 g via INTRAVENOUS
  Filled 2018-12-28 (×8): qty 1

## 2018-12-28 MED ORDER — HYDROMORPHONE BOLUS VIA INFUSION
0.2000 mg | INTRAVENOUS | Status: DC | PRN
  Administered 2018-12-28: 0.2 mg via INTRAVENOUS
  Filled 2018-12-28: qty 1

## 2018-12-28 NOTE — Progress Notes (Signed)
Pharmacy Antibiotic Note  Jody Taylor is a 78 y.o. female admitted on 11/25/2018.  Pharmacy has been consulted for Vancomycin and Meropenem dosing for sepsis/ HCAP.  Plan: Meropenem 1g IV q12h Vancomycin 1000 mg IV q36h. Measure Vanc peak and trough at steady state. Goal AUC = 400 - 550.  Est AUC 483 using SCr 1.14 Follow up renal function, culture results, and clinical course.   Height: 4\' 10"  (147.3 cm) Weight: 226 lb 3.1 oz (102.6 kg) IBW/kg (Calculated) : 40.9  Temp (24hrs), Avg:99.6 F (37.6 C), Min:98.8 F (37.1 C), Max:100.3 F (37.9 C)  Recent Labs  Lab 12/24/18 0425 12/25/18 0441 12/25/18 0954 12/26/18 0500 12/27/18 0205 12/28/18 0101  WBC 9.8  --  11.0* 11.2* 16.1* 33.1*  CREATININE 0.97 0.92  --  0.90 0.89 1.14*    Estimated Creatinine Clearance: 43.5 mL/min (A) (by C-G formula based on SCr of 1.14 mg/dL (H)).    Allergies  Allergen Reactions  . Ace Inhibitors Other (See Comments) and Cough    CHEST PAIN  . Heparin Other (See Comments)    HITT  . Other     Patient reports receiving blood after a miscarriage "years ago". She states she broke out from receiving this blood. Has not received any since  . Codeine Nausea And Vomiting  . Levaquin [Levofloxacin] Nausea And Vomiting    Antimicrobials this admission: 6/29 Remdesivir >>7/3 6/29 Actemra 6/30 cPlasma 7/5 Linezolid >>7/6 7/5 Cefepime >> 7/7 7/12 Vanc >>  7/12 Meropenem >>   Microbiology results: 6/29 MRSA PCR: negative 7/5 TA: Mod GPCs in clusters - normal flora  7/5 BCx: NGF 7/5 UCx: 100k MRSE 7/12 TA: no org seen, pending.  Thank you for allowing pharmacy to be a part of this patient's care.  Gretta Arab PharmD, BCPS Clinical pharmacist phone 7am- 5pm: [phone] 12/28/2018 9:17 AM

## 2018-12-28 NOTE — Progress Notes (Signed)
Jody Taylor called & wanted to know how her mother did throughout the night. I updated her that no new changes had occurred & that she remained stable all night without complications. Jody Taylor was extremely appreciative of all we do. She said she will be calling to facetime later today.

## 2018-12-28 NOTE — Progress Notes (Signed)
Spoke with patients daughter and granddaughter on the telephone. Update provided and all questions answered. Family expressed gratitude for the care provided by staff.

## 2018-12-28 NOTE — Progress Notes (Signed)
PROGRESS NOTE  Jody Taylor UYQ:[id number] DOB: Jan 20, 1941 DOA: 12/07/2018  PCP: Nicoletta Dress, MD  Brief History/Interval Summary: Patient is a 78 y.o. female with PMHx of HTN, CAD, chronic diastolic heart failure, severe persistent asthma on benralizumab, hypothyroidism, CKD stage III-presented to [facility] with acute respiratory distress-was found to have acute hypoxic respiratory failure secondary to COVID-19 pneumonia and emergently intubated and subsequently transferred to [facility] course complicated by ventilator dyssynchrony requiring prn paralytics, atrial fibrillation-and concern for encephalopathy as she has been at times not responsive even when off sedation.  See below for further details  Reason for Visit: Acute respiratory disease due to COVID-19  Consultants: Pulmonology  Procedures:  Intubation.   Right internal jugular central venous catheter placement 6/29  Transthoracic echocardiogram 1. The left ventricle has normal systolic function with an ejection fraction of 60-65%. The cavity size was normal. There is moderate asymmetric left ventricular hypertrophy. Left ventricular diastolic parameters were normal.  2. The right ventricle has normal systolic function. The cavity was normal. There is no increase in right ventricular wall thickness.  3. No evidence of mitral valve stenosis.  4. No stenosis of the aortic valve.  5. The aortic root and ascending aorta are normal in size and structure.  6. The interatrial septum was not assessed.   Antibiotics: Anti-infectives (From admission, onward)   Start     Dose/Rate Route Frequency Ordered Stop   12/29/18 0000  vancomycin (VANCOCIN) 1,500 mg in sodium chloride 0.9 % 500 mL IVPB     1,500 mg 250 mL/hr over 120 Minutes Intravenous Every 36 hours 12/27/18 1254     12/27/18 1130  vancomycin (VANCOCIN) 2,000 mg in sodium chloride 0.9 % 500 mL IVPB     2,000 mg 250 mL/hr over 120 Minutes  Intravenous  Once 12/27/18 1048 12/27/18 1430   12/27/18 1130  meropenem (MERREM) 1 g in sodium chloride 0.9 % 100 mL IVPB     1 g 200 mL/hr over 30 Minutes Intravenous Every 8 hours 12/27/18 1048     12/20/18 1230  linezolid (ZYVOX) IVPB 600 mg  Status:  Discontinued     600 mg 300 mL/hr over 60 Minutes Intravenous Every 12 hours 12/20/18 1210 12/21/18 1227   12/20/18 1230  ceFEPIme (MAXIPIME) 2 g in sodium chloride 0.9 % 100 mL IVPB  Status:  Discontinued     2 g 200 mL/hr over 30 Minutes Intravenous Every 12 hours 12/20/18 1229 12/22/18 1113   12/15/18 2000  remdesivir 100 mg in sodium chloride 0.9 % 250 mL IVPB     100 mg 500 mL/hr over 30 Minutes Intravenous Every 24 hours 12/03/2018 1845 12/18/18 2053   11/20/2018 2000  remdesivir 200 mg in sodium chloride 0.9 % 250 mL IVPB     200 mg 500 mL/hr over 30 Minutes Intravenous Once 12/13/2018 1845 12/08/2018 2116       Subjective/Interval History: Patient intubated sedated.  Oxygenation has worsened.  Also remains hypotensive.  After rounds were completed RN noted blood coming out of the endotracheal tube.     Assessment/Plan:  Acute Hypoxic Resp. Failure due to Acute Covid 19 Viral Illness/ARDS  Vent Mode: PRVC FiO2 (%):  [60 %-100 %] 100 % Set Rate:  [24 bmp-30 bmp] 30 bmp Vt Set:  [320 mL-400 mL] 320 mL PEEP:  [8 cmH20-12 cmH20] 12 cmH20 Plateau Pressure:  [26 cmH20-34 cmH20] 34 cmH20     Component Value Date/Time   PHART  7.394 12/24/2018 1150   PCO2ART 45.0 12/24/2018 1150   PO2ART 64.0 (L) 12/24/2018 1150   HCO3 27.5 12/24/2018 1150   TCO2 29 12/24/2018 1150   ACIDBASEDEF 5.0 (H) 12/15/2018 1501   O2SAT 92.0 12/24/2018 1150    COVID-19 Labs  Recent Labs    12/26/18 0500 12/28/18 0101  DDIMER 15.77* 12.46*     Fever: Low-grade fever noted Oxygen requirements: On mechanical ventilation.  Currently on 100% FiO2.  Saturating in the 90s.    Antibiotics: On vancomycin and meropenem, day 2.  Previously was on cefepime  and linezolid for 3 days. Remdesivir: Completed course of Remdesivir Steroids: Completed course of steroids. Diuretics: He was given Lasix for 3 days but not in the last 48 hours due to hypotension Actemra: Received Actemra on 6/29 at [facility] Plasma: Received convalescent plasma on 6/30 Vitamin C and Zinc: Continue DVT Prophylaxis: Continue IV heparin.    Patient remains intubated and sedated.  On mechanical ventilation.  Pulmonology is following.  Patient appears to be gradually getting worse.  Her WBC increased significantly.  She was placed on broad-spectrum antibiotics with vancomycin and meropenem.  Tracheal aspirate cultures are pending.  Procalcitonin 0.61.  D-dimer 12.46.  Prognosis seems to be getting worse.  Hemoptysis Significant bloody secretions noted from trachea after rounds.  Emergent bronchoscopy done by pulmonology which showed a possible laceration in the trachea which could be the reason for the profuse bleeding.  Her anticoagulation has been held.  We will recheck hemoglobin.  Atrial fibrillation with RVR While she was undergoing bronchoscopy patient had tachycardia.  She was noted to be in A. fib with RVR.  Can utilize rate control medication as long as blood pressure tolerates.  If not may need to do amiodarone.  She had echocardiogram done during this hospitalization which did not show any concerning findings.  TSH and free T4 have been normal.  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  Patient was changed over from IV heparin to Angiomax.  Platelet counts have improved.  HIT panel pending.    Acute metabolic encephalopathy Encephalopathy is most likely due to acute illness.  CT head done on 7/6 did not show any acute findings.  Continue to monitor  Steroid-induced hyperglycemia in the setting of known prediabetes HbA1c 6.4.  CBGs are reasonably well controlled.  She is on Levemir and SSI.  Monitor CBGs closely.    Hypotension Patient is requiring  Levophed.  Monitor closely.    Hypernatremia Resolved with free water.  Episodes of bradycardia She became bradycardic on 7/9 due to vagal stimulation while being suctioned.  Had to be given atropine.  Elevated d-dimer Lower extremity Doppler study on 7/6 were negative for DVT.  Elevated d-dimer most likely due to COVID-19.  D-dimer was 15.77 on 7/11.  Noted to be 12.46 today.  She does have a DVT in the right upper extremity.    Acute DVT right upper extremity In the setting of PICC line.  A central line was placed yesterday.  PICC line to be removed.  Chronic kidney disease stage III Renal function remains close to baseline.  Foot.  Monitor closely.     History of coronary artery disease Continue aspirin.  History of chronic diastolic CHF Patient had significant weight gain in the hospital.  This has improved with diuresis  History of severe persistent asthma No wheezing noted.  Patient was on benralizumab as outpatient.  History of dyslipidemia Continue statin.  History of hypothyroidism Continue levothyroxine.  TSH  and free T4 were normal.  History of GERD Continue PPI  History of anxiety and depression Stable.  Positive urine cultures Urine culture grew staph epidermidis.  Blood cultures negative.  Patient's procalcitonin level was less than 0.1.  Patient was given cefepime and linezolid for about 3 days.  Due to clinical stability patient was not given any additional antibiotics at that time.  Nutrition Continue tube feedings.  Goals of care Patient appears to be slowly declining.  Despite maximal support she does not appear to be improving.  Pulmonology to discuss this further with patient's family.  Prognosis seems to be poor.   DVT Prophylaxis: On Angiomax.  Held due to significant hemoptysis PUD Prophylaxis: Protonix Code Status: DNR Family Communication: Discussed with son on a daily basis Disposition Plan: Remain in ICU   Medications:  Scheduled: .  artificial tears  1 application Both Eyes M1D  . aspirin  81 mg Per Tube Daily  . chlorhexidine  15 mL Mouth/Throat BID  . Chlorhexidine Gluconate Cloth  6 each Topical Daily  . free water  350 mL Per Tube Q4H  .  HYDROmorphone (DILAUDID) injection  0.5 mg Intravenous Once  . insulin aspart  0-15 Units Subcutaneous Q4H  . insulin detemir  20 Units Subcutaneous Daily  . levothyroxine  50 mcg Intravenous Daily  . mouth rinse  15 mL Mouth Rinse 10 times per day  . pantoprazole sodium  40 mg Per Tube Daily  . sodium chloride flush  10-40 mL Intracatheter Q12H  . vecuronium  0.08 mg/kg Intravenous Once  . vitamin C  500 mg Per Tube Daily  . zinc sulfate  220 mg Per Tube Daily   Continuous: . sodium chloride Stopped (12/27/18 0340)  . bivalirudin (ANGIOMAX) infusion 0.5 mg/mL (Non-ACS indications) 0.06 mg/kg/hr (12/28/18 0400)  . feeding supplement (VITAL HIGH PROTEIN) 50 mL/hr at 12/28/18 0800  . HYDROmorphone 6 mg/hr (12/28/18 1125)  . meropenem (MERREM) IV 1 g (12/28/18 1100)  . midazolam 6 mg/hr (12/28/18 1136)  . norepinephrine (LEVOPHED) Adult infusion 16.939 mcg/min (12/28/18 0400)  . [START ON 12/29/2018] vancomycin    . vecuronium (NORCURON) infusion 1 mcg/kg/min (12/28/18 1142)   QQI:WLNLGXQJJHERD, atropine, hydrALAZINE, HYDROmorphone, ipratropium-albuterol, midazolam, midazolam, nitroGLYCERIN, [DISCONTINUED] ondansetron **OR** ondansetron (ZOFRAN) IV, polyethylene glycol   Objective:  Vital Signs  Vitals:   12/28/18 0948 12/28/18 1000 12/28/18 1049 12/28/18 1100  BP:  95/60 140/76 (!) 135/55  Pulse:  (!) 109 (!) 137 (!) 142  Resp:  17 (!) 30 (!) 30  Temp:      TempSrc:      SpO2: (!) (P) 84% (!) 86% 90% 93%  Weight:      Height:        Intake/Output Summary (Last 24 hours) at 12/28/2018 1154 Last data filed at 12/28/2018 1142 Gross per 24 hour  Intake 3811.33 ml  Output 1520 ml  Net 2291.33 ml   Filed Weights   12/26/18 0500 12/27/18 0428 12/28/18 0500   Weight: 97.8 kg 100.5 kg 102.6 kg    General appearance: Intubated and sedated Resp: Crackles at the bases.  Rhonchorous breath sounds noted.  No wheezing.   Cardio: S1-S2 is irregularly irregular. GI: Abdomen is soft.  Nontender nondistended.  Bowel sounds are present normal.  No masses organomegaly Extremities: Improved edema bilateral lower extremities.  Swelling of the right upper extremity noted. Neurologic: Remains sedated.     Lab Results:  Data Reviewed: I have personally reviewed following labs and imaging studies  CBC: Recent Labs  Lab 12/24/18 0425 12/24/18 1150 12/25/18 0954 12/26/18 0500 12/27/18 0205 12/28/18 0101  WBC 9.8  --  11.0* 11.2* 16.1* 33.1*  HGB 11.1* 11.9* 10.8* 10.8* 11.8* 12.4  HCT 35.6* 35.0* 36.0 35.4* 37.5 38.7  MCV 100.0  --  100.3* 100.3* 99.7 98.5  PLT 154  --  133* 110* 127* 491    Basic Metabolic Panel: Recent Labs  Lab 12/22/18 0510  12/24/18 0425 12/24/18 1150 12/25/18 0441 12/26/18 0500 12/27/18 0205 12/27/18 0500 12/28/18 0101  NA 144   < > 144 141 141 136 137  --  137  K 3.6   < > 4.4 4.2 4.6 4.0 3.8  --  4.8  CL 111   < > 108  --  106 99 101  --  102  CO2 26   < > 26  --  28 29 27   --  23  GLUCOSE 156*   < > 196*  --  164* 126* 116*  --  188*  BUN 66*   < > 65*  --  63* 65* 62*  --  68*  CREATININE 1.07*   < > 0.97  --  0.92 0.90 0.89  --  1.14*  CALCIUM 7.9*   < > 8.0*  --  8.1* 7.8* 7.5*  --  8.2*  MG 2.3  --   --   --   --   --   --  2.2  --    < > = values in this interval not displayed.    GFR: Estimated Creatinine Clearance: 43.5 mL/min (A) (by C-G formula based on SCr of 1.14 mg/dL (H)).  Liver Function Tests: Recent Labs  Lab 12/22/18 0510 12/23/18 0612 12/24/18 0425  AST 27 30 27   ALT 19 23 24   ALKPHOS 78 84 80  BILITOT 0.3 0.3 0.3  PROT 4.3* 4.6* 4.4*  ALBUMIN 2.0* 2.0* 1.9*    CBG: Recent Labs  Lab 12/27/18 1529 12/27/18 1954 12/27/18 2323 12/28/18 0321 12/28/18 0740  GLUCAP 163*  225* 175* 192* 185*     Recent Results (from the past 240 hour(s))  Culture, respiratory (non-expectorated)     Status: None   Collection Time: 12/20/18 12:03 PM   Specimen: Tracheal Aspirate; Respiratory  Result Value Ref Range Status   Specimen Description   Final    TRACHEAL ASPIRATE Performed at [facility] [address] [place], [place] [address]    Special Requests   Final    Immunocompromised Performed at [facility], [place] [address] [place], [place] [address]    Gram Stain   Final    FEW WBC PRESENT, PREDOMINANTLY PMN RARE SQUAMOUS EPITHELIAL CELLS PRESENT MODERATE GRAM POSITIVE COCCI IN CLUSTERS RARE GRAM POSITIVE RODS RARE BUDDING YEAST SEEN    Culture   Final    FEW Consistent with normal respiratory flora. Performed at [facility], [place] [address] [place], [place] [address]    Report Status 12/23/2018 FINAL  Final  Culture, Urine     Status: Abnormal   Collection Time: 12/20/18 12:03 PM   Specimen: Urine, Clean Catch  Result Value Ref Range Status   Specimen Description   Final    URINE, CLEAN CATCH Performed at [facility], [place] [address], [place], [place] [address]    Special Requests   Final    Immunocompromised Performed at [facility], [place] [address] [place], [place] [address]  Culture >=100,000 COLONIES/mL STAPHYLOCOCCUS EPIDERMIDIS (A)  Final   Report Status 12/22/2018 FINAL  Final   Organism ID, Bacteria STAPHYLOCOCCUS EPIDERMIDIS (A)  Final      Susceptibility   Staphylococcus epidermidis - MIC*    CIPROFLOXACIN >=8 RESISTANT Resistant     GENTAMICIN <=0.5 SENSITIVE Sensitive     NITROFURANTOIN <=16 SENSITIVE Sensitive     OXACILLIN >=4 RESISTANT Resistant     TETRACYCLINE <=1 SENSITIVE Sensitive     VANCOMYCIN 2 SENSITIVE Sensitive     TRIMETH/SULFA <=10 SENSITIVE Sensitive     CLINDAMYCIN <=0.25 SENSITIVE Sensitive     RIFAMPIN <=0.5 SENSITIVE  Sensitive     Inducible Clindamycin NEGATIVE Sensitive     * >=100,000 COLONIES/mL STAPHYLOCOCCUS EPIDERMIDIS  Culture, blood (Routine X 2) w Reflex to ID Panel     Status: None   Collection Time: 12/20/18 12:40 PM   Specimen: BLOOD  Result Value Ref Range Status   Specimen Description   Final    BLOOD LEFT ANTECUBITAL Performed at [facility] [address] [place], [place] [address]    Special Requests   Final    BOTTLES DRAWN AEROBIC ONLY Blood Culture adequate volume Performed at [facility] [address], [place], [place] [address]    Culture   Final    NO GROWTH 5 DAYS Performed at [facility], [place] [address], [place], [place] [address]    Report Status 12/25/2018 FINAL  Final  Culture, blood (Routine X 2) w Reflex to ID Panel     Status: None   Collection Time: 12/20/18 12:45 PM   Specimen: BLOOD  Result Value Ref Range Status   Specimen Description   Final    BLOOD LEFT HAND Performed at [facility] [address], [place], [place] [address]    Special Requests   Final    BOTTLES DRAWN AEROBIC ONLY Blood Culture adequate volume Performed at [facility] [address] [place], [place] [address]    Culture   Final    NO GROWTH 5 DAYS Performed at [facility], [place] [address] [place], [place] [address]    Report Status 12/25/2018 FINAL  Final  Culture, respiratory (non-expectorated)     Status: None (Preliminary result)   Collection Time: 12/27/18 12:38 PM   Specimen: Tracheal Aspirate; Respiratory  Result Value Ref Range Status   Specimen Description   Final    TRACHEAL ASPIRATE Performed at [facility] [address] [place], [place] [address]    Special Requests   Final    NONE Performed at [facility], [place] [address] [place], [place] [address]    Gram Stain   Final    FEW WBC PRESENT,BOTH PMN AND MONONUCLEAR NO  ORGANISMS SEEN    Culture   Final    CULTURE REINCUBATED FOR BETTER GROWTH Performed at [facility], [place] [address], [place], [place] [address]    Report Status PENDING  Incomplete      Radiology Studies: Dg Chest Port 1 View  Result Date: 12/27/2018 CLINICAL DATA:  Status post central line placement EXAM: PORTABLE CHEST 1 VIEW COMPARISON:  Film from earlier in the same day. FINDINGS: Endotracheal tube and feeding catheter are again identified and stable. New left jugular central line is noted with the catheter tip in the left innominate vein just short of the superior vena cava. No pneumothorax is noted. The right-sided PICC  line is again noted within the right innominate vein just short of the SVC. Cardiac shadow is stable. Patchy infiltrates are again identified bilaterally. IMPRESSION: No change in patchy airspace disease. No pneumothorax following left jugular central line. The catheter tip is noted in the left innominate vein. Electronically Signed   By: Inez Catalina M.D.   On: 12/27/2018 19:51   Dg Chest Port 1 View  Result Date: 12/27/2018 CLINICAL DATA:  Acute respiratory failure with hypoxemia. COVID-19 infection. On ventilator. EXAM: PORTABLE CHEST 1 VIEW COMPARISON:  12/26/2018 FINDINGS: Endotracheal tube, feeding tube, and right arm PICC line remain in satisfactory position. Heart size is within normal limits. Diffuse bilateral airspace disease is again seen, without significant change. No evidence of pneumothorax or pleural effusion. IMPRESSION: Diffuse bilateral airspace disease, without significant change. Electronically Signed   By: Marlaine Hind M.D.   On: 12/27/2018 11:32   Vas [place] Upper Extremity Venous Duplex  Result Date: 12/27/2018 UPPER VENOUS STUDY  Indications: Edema, and PICC line. Covid positive. Ventilator Limitations: Ventilator, PICC line, bandages, edema, and body habitus. Comparison Study: No prior study on file for comparison. Performing Technologist:  Sharion Dove RVS  Examination Guidelines: A complete evaluation includes B-mode imaging, spectral Doppler, color Doppler, and power Doppler as needed of all accessible portions of each vessel. Bilateral testing is considered an integral part of a complete examination. Limited examinations for reoccurring indications may be performed as noted.  Right Findings: +----------+------------+---------+-----------+----------+--------------+ RIGHT     CompressiblePhasicitySpontaneousProperties   Summary     +----------+------------+---------+-----------+----------+--------------+ IJV           Full       Yes       Yes                             +----------+------------+---------+-----------+----------+--------------+ Subclavian               Yes       Yes                             +----------+------------+---------+-----------+----------+--------------+ Axillary      None       No        No                   Acute      +----------+------------+---------+-----------+----------+--------------+ Brachial                 No        No                   Acute      +----------+------------+---------+-----------+----------+--------------+ Radial                                              Not visualized +----------+------------+---------+-----------+----------+--------------+ Ulnar                                               Not visualized +----------+------------+---------+-----------+----------+--------------+ Cephalic      None  Acute      +----------+------------+---------+-----------+----------+--------------+ Basilic       Full                                                 +----------+------------+---------+-----------+----------+--------------+  Left Findings: +----------+------------+---------+-----------+----------+-------+ LEFT      CompressiblePhasicitySpontaneousPropertiesSummary  +----------+------------+---------+-----------+----------+-------+ Subclavian               Yes       Yes                      +----------+------------+---------+-----------+----------+-------+  Summary:  Right: No evidence of deep vein thrombosis in the . However, unable to visualize the Radial, ulnar. No evidence of superficial vein thrombosis in the . However, unable to visualize the Radial, ulnar. No evidence of thrombosis in the . However, unable to visualize the Radial, ulnar. Findings consistent with acute deep vein thrombosis involving the right axillary vein and right brachial veins. Findings consistent with acute superficial vein thrombosis involving the right cephalic vein. This was a limited study.  Left: No evidence of thrombosis in the subclavian.  *See table(s) above for measurements and observations.    Preliminary        LOS: 14 days   Travon Crochet [employer] on [email or web address]  12/28/2018, 11:54 AM

## 2018-12-28 NOTE — Progress Notes (Signed)
Assisted patient's daughter, Margaretha Sheffield with facetime video visit. Provided full update/answered all questions.

## 2018-12-28 NOTE — Progress Notes (Signed)
ETT noted to be at 20 @ lip rather than 23 that had earlier been documented.  D/T procedure findings today, This RT opts to keep tube at 20 for time being unless pt becomes unable to ventilate or other problems occur.  RN aware.  RT will continue to monitor.

## 2018-12-28 NOTE — Procedures (Signed)
PCCM Video Bronchoscopy Procedure Note  The patient was informed of the risks (including but not limited to bleeding, infection, respiratory failure, lung injury, tooth/oral injury) and benefits of the procedure and gave consent, see chart.  Indication: acute worsening hypoxemia, severe hemoptysis  Post Procedure Diagnosis: laceration mid trachea just distal to the endotracheal tube  Location: [facility]  Condition pre procedure: critically ill, on vent  Medications for procedure: versed, dilaudid infusions  Procedure description: The bronchoscope was introduced through the endotracheal tube and passed to the bilateral lungs to the level of the subsegmental bronchi throughout the tracheobronchial tree.    Airway exam revealed a laceration just distal to the endotracheal tube, some bleeding which was suctioned.  Still had minor oozing at the completion of the procedure, had to stop due to hypoxemia.  She had no airway mass or plugging.  Procedures performed: none  Specimens sent: none  Condition post procedure: critically on vent  EBL: < 25 cc  Complications: none immediate  Roselie Awkward, MD [facility] Pager: [phone] Cell: [phone] If no response, call [phone]

## 2018-12-28 NOTE — Progress Notes (Signed)
NAME:  Jody Taylor, MRN:  [id number], DOB:  02-May-1941, LOS: 64 ADMISSION DATE:  11/22/2018, CONSULTATION DATE:  6/29 REFERRING MD:  Sloan Leiter, CHIEF COMPLAINT:  Dyspnea   Brief History   78 y/o female with a history of diastolic heart failure admitted on June 29 for ARDS in setting of COVID 19 pneumonia.  Required intubation at [facility] emergency room.  Past Medical History  Diastolic heart failure History of stroke (mini stroke) Obstructive sleep apnea on CPAP Hypertension Hyperlipidemia Coronary artery disease, had PCI in 2595 Chronic diastolic heart failure Chronic kidney disease GERD Allergic rhinitis  Significant Hospital Events   June 29 admission July 2 severe vent dyssynchrony July 3 oxygenation worsening overnight, tvol 12cc/kg IB on SIMV, changed to [facility], paralytic used July 4 no acute events July 5 paralytic used July 6 no acute changed, fever, sedation minimized July 7 Afib July 8 severe dyssyncrhony, large tidal volumes, diaphoresis, fentanyl drip added back, low dose versed, changed back to [facility] July 9 more comfortable, stable TVol, severe ventilator dyssynchrony lead increased intrathoracic pressure, bradycardia requiring atropine emergently.  Change sedation from fentanyl to Dilaudid, improved in a later synchrony July 10 stable, weaning vent, diuresing July 12 increased FiO2 overnight, hypotensive started on levophed, thrombocytopenic started on argatroban July 13 increased work of breathing, hemoptysis on rounds, afib with RVR on rounds  Consults:  PCCM  Procedures:  June 29 endotracheal tube> June 29 right internal jugular central venous line>June 29 June 30 PICC >   Significant Diagnostic Tests:  7/6 CT head > hold infarcts 7/7 LE Doppler negative for DVT  Micro Data:  June 26 SARS-COV-2 Positivie July 5 Urine > staph epidermidis  Antimicrobials:  June 29 remdesivir June29Actemra June 29 solumedrol - decadron 7/1 >> June 29  convalescent plasma  ............................. 7/5  zyvox > 7/6 7/5 - cefepime > 7.6  Interim history/subjective:   July 13 increased work of breathing, hemoptysis on rounds, afib with RVR on rounds    Objective   Blood pressure 117/63, pulse (!) 112, temperature 100.3 F (37.9 C), temperature source Oral, resp. rate (!) 24, height 4\' 10"  (1.473 m), weight 102.6 kg, SpO2 (!) 89 %.    Vent Mode: PRVC FiO2 (%):  [60 %] 60 % Set Rate:  [24 bmp] 24 bmp Vt Set:  [400 mL] 400 mL PEEP:  [8 cmH20] 8 cmH20 Plateau Pressure:  [21 cmH20-27 cmH20] 26 cmH20   Intake/Output Summary (Last 24 hours) at 12/28/2018 0802 Last data filed at 12/28/2018 0700 Gross per 24 hour  Intake 3873.08 ml  Output 1145 ml  Net 2728.08 ml   Filed Weights   12/26/18 0500 12/27/18 0428 12/28/18 0500  Weight: 97.8 kg 100.5 kg 102.6 kg    Examination:  General:  In bed on vent HENT: NCAT ETT in place PULM: Rhonchi bilaterally B, vent supported breathing CV: irreg irreg, tachy no mgr GI: BS+, soft, nontender MSK: normal bulk and tone Neuro: sedated on vent   7/12 bilateral airspace disease, unchanged, ETT in place  Resolved Hospital Problem list   Bradycardia: improved with better vent synchrony AKI : resolved  Assessment & Plan:  ARDS due to COVID 19 pneumonia: oxygenation slightly worse 7/12 Continue fentanyl and Versed titrated to an RA SS goal of -2 Has periods of severe ventilator dyssynchrony leading to increased air trapping and vagal response Severe hypoxemia, increased work of breathing, ventialtor dyssynchrony 7/13 in setting of massive hemoptysis Plan Immediate emergent bronchosocpy: discussed with son who agrees  Stop angiomax Rocuronium bolus now for vent synchrony Continue mechanical ventilation per ARDS protocol Target TVol 6-8cc/kgIBW Target Plateau Pressure < 30cm H20 Target driving pressure less than 15 cm of water Target PaO2 55-65: titrate PEEP/FiO2 per protocol As long  as PaO2 to FiO2 ratio is less than 1:50 position in prone position for 16 hours a day Check CVP daily if CVL in place Target CVP less than 4, diurese as necessary Ventilator associated pneumonia prevention protocol  New Right upper extremity DVT HITT? Angiomax on hold right now with hemoptysis Resume if bleeding subsides  Atrial fib with RVR: due to hemoptysis, hypoxemia Bronch now for airway clearance, immediate treatment of hypoxemia If no improvement then amiodarone  Shock 7/12, with encephalopathy, rising WBC count: bacterial infection? COVID? Continue meropenem, vanc  Acute encephalopathy: worse 7/12, could be due to sedation, new infection? Hold sedation RA SS target 0 Stop clonazepam and quetiapine Continue RASS target -2, continue dilaudid, versed, wean as abl RASS target -2, increase sedation for better vent synchrony this morning Continue clonazepam, quetiapine  Diastolic heart failure Tele Hold lasix  Best practice:  Diet: tube feeding Pain/Anxiety/Delirium protocol (if indicated): RASS goal -2 minimize sedation VAP protocol (if indicated): Yes DVT prophylaxis: heparin infusion GI prophylaxis: yes Pantoprazole for stress ulcer prophylaxis Glucose control: SSI Mobility: bed rest Code Status: full Family Communication: per [facility] Dt Disposition: remain in ICU  Labs   CBC: Recent Labs  Lab 12/24/18 0425 12/24/18 1150 12/25/18 0954 12/26/18 0500 12/27/18 0205 12/28/18 0101  WBC 9.8  --  11.0* 11.2* 16.1* 33.1*  HGB 11.1* 11.9* 10.8* 10.8* 11.8* 12.4  HCT 35.6* 35.0* 36.0 35.4* 37.5 38.7  MCV 100.0  --  100.3* 100.3* 99.7 98.5  PLT 154  --  133* 110* 127* 573    Basic Metabolic Panel: Recent Labs  Lab 12/22/18 0510  12/24/18 0425 12/24/18 1150 12/25/18 0441 12/26/18 0500 12/27/18 0205 12/27/18 0500 12/28/18 0101  NA 144   < > 144 141 141 136 137  --  137  K 3.6   < > 4.4 4.2 4.6 4.0 3.8  --  4.8  CL 111   < > 108  --  106 99 101  --  102  CO2 26    < > 26  --  28 29 27   --  23  GLUCOSE 156*   < > 196*  --  164* 126* 116*  --  188*  BUN 66*   < > 65*  --  63* 65* 62*  --  68*  CREATININE 1.07*   < > 0.97  --  0.92 0.90 0.89  --  1.14*  CALCIUM 7.9*   < > 8.0*  --  8.1* 7.8* 7.5*  --  8.2*  MG 2.3  --   --   --   --   --   --  2.2  --    < > = values in this interval not displayed.   GFR: Estimated Creatinine Clearance: 43.5 mL/min (A) (by C-G formula based on SCr of 1.14 mg/dL (H)). Recent Labs  Lab 12/22/18 0510  12/25/18 0954 12/26/18 0500 12/27/18 0205 12/28/18 0101  PROCALCITON <0.10  --   --   --   --  0.61  WBC 9.1   < > 11.0* 11.2* 16.1* 33.1*   < > = values in this interval not displayed.    Liver Function Tests: Recent Labs  Lab 12/22/18 0510 12/23/18 0612 12/24/18 0425  AST 27  30 27  ALT 19 23 24   ALKPHOS 78 84 80  BILITOT 0.3 0.3 0.3  PROT 4.3* 4.6* 4.4*  ALBUMIN 2.0* 2.0* 1.9*   No results for input(s): LIPASE, AMYLASE in the last 168 hours. No results for input(s): AMMONIA in the last 168 hours.  ABG    Component Value Date/Time   PHART 7.394 12/24/2018 1150   PCO2ART 45.0 12/24/2018 1150   PO2ART 64.0 (L) 12/24/2018 1150   HCO3 27.5 12/24/2018 1150   TCO2 29 12/24/2018 1150   ACIDBASEDEF 5.0 (H) 12/15/2018 1501   O2SAT 92.0 12/24/2018 1150     Coagulation Profile: No results for input(s): INR, PROTIME in the last 168 hours.  Cardiac Enzymes: No results for input(s): CKTOTAL, CKMB, CKMBINDEX, TROPONINI in the last 168 hours.  HbA1C: Hgb A1c MFr Bld  Date/Time Value Ref Range Status  12/20/2018 04:06 AM 6.4 (H) 4.8 - 5.6 % Final    Comment:    (NOTE) Pre diabetes:          5.7%-6.4% Diabetes:              >6.4% Glycemic control for   <7.0% adults with diabetes   11/08/2016 07:24 AM 5.6 4.8 - 5.6 % Final    Comment:    (NOTE)         Pre-diabetes: 5.7 - 6.4         Diabetes: >6.4         Glycemic control for adults with diabetes: <7.0     CBG: Recent Labs  Lab 12/27/18  1529 12/27/18 1954 12/27/18 2323 12/28/18 0321 12/28/18 0740  GLUCAP 163* 225* 175* 192* 185*     Critical care time: Belle, MD [place] PCCM Pager: [phone] Cell: [phone] If no response, call [phone]

## 2018-12-28 NOTE — Progress Notes (Signed)
[place] for bivalirudin Indication: atrial fibrillation/ r/o HIT, DVT  Allergies  Allergen Reactions  . Ace Inhibitors Other (See Comments) and Cough    CHEST PAIN  . Heparin Other (See Comments)    HITT  . Other     Patient reports receiving blood after a miscarriage "years ago". She states she broke out from receiving this blood. Has not received any since  . Codeine Nausea And Vomiting  . Levaquin [Levofloxacin] Nausea And Vomiting    Patient Measurements: Height: 4\' 10"  (147.3 cm) Weight: 221 lb 9 oz (100.5 kg) IBW/kg (Calculated) : 40.9 Heparin Dosing Weight: 64 kg   Vital Signs: Temp: 100.3 F (37.9 C) (07/13 0310) Temp Source: Oral (07/13 0310) BP: 128/66 (07/13 0300) Pulse Rate: 102 (07/13 0300)  Labs: Recent Labs    12/25/18 2235 12/26/18 0500 12/26/18 0900  12/27/18 0205  12/27/18 1400 12/27/18 1941 12/28/18 0101  HGB  --  10.8*  --   --  11.8*  --   --   --  12.4  HCT  --  35.4*  --   --  37.5  --   --   --  38.7  PLT  --  110*  --   --  127*  --   --   --  163  APTT  --   --   --    < > 43*   < > 25 35 45*  HEPARINUNFRC 0.87*  --  0.10*  --   --   --   --   --   --   CREATININE  --  0.90  --   --  0.89  --   --   --  1.14*   < > = values in this interval not displayed.    Estimated Creatinine Clearance: 42.9 mL/min (A) (by C-G formula based on SCr of 1.14 mg/dL (H)).   Medical History: Past Medical History:  Diagnosis Date  . 2-vessel coronary artery disease   . Adult hypothyroidism   . Allergic rhinitis 09/12/2016  . Anemia of chronic disease   . Arthritis   . Asthma   . CAD in native artery 05/02/2015   Overview:   S/P PCI and stent x 2. Last cardiac cath August 2010 with mild nonobstructive CAD and normal LV function PCI and stent of proximal [facility] 1999 Cath Nov 2016:Angiographic findings Cardiac Arteries and Lesion Findings LMCA: Normal. LAD: Normal. LCx: Normal. RCA: Abnormal. Lesion on R PDA:  Ostial.35% stenosis 5 mm length . Pre procedure TIMI III flow was noted. Good run off was present.Bifurcation lesion.  Cath 05/08/15:Mild non-obstructive coronary artery disease. Normal LV function  . Cancer ([place]) 03/31/2017  . Chronic diastolic CHF (congestive heart failure) ([place])   . Chronic GERD   . Chronic kidney disease, stage 3 (moderate) ([place])    patient denies (listed in 07/20/16 PCP notes-Dr. Nelda Bucks)  . CKD (chronic kidney disease) 06/05/2015  . Colon polyp   . Cough 09/12/2016  . Depression    since hysterectomy   . Dyspnea 09/12/2016  . Hallucinations 08/17/2013  . Heart attack ([place])   . Hyperlipidemia LDL goal <70   . Hypertension, essential, benign   . Obstructive sleep apnea 09/12/2016  . Polio   . PONV (postoperative nausea and vomiting)   . PONV (postoperative nausea and vomiting)   . 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 Sleep apnea    wears CPAP  . Spondylolisthesis of lumbosacral  region 11/07/2016  . Stroke [facility])    "i had a mini stroke I didn't even know I had it" found on MRI  . Vaginal atrophy 01/30/2016  . Vitamin D deficiency   . Wheezing 09/12/2016    Medications:  Facility-Administered Medications Prior to Admission  Medication Dose Route Frequency Provider Last Rate Last Dose  . Benralizumab SOSY 30 mg  30 mg Subcutaneous Q8 Weeks Kozlow, Donnamarie Poag, MD   30 mg at 10/22/18 1120   Medications Prior to Admission  Medication Sig Dispense Refill Last Dose  . cefUROXime (CEFTIN) 250 MG tablet Take 250 mg by mouth 2 (two) times a day. Take 250mg  by mouth twice daily for seven (7) days     . levothyroxine (SYNTHROID) 100 MCG tablet    unknown  . methylPREDNISolone (MEDROL DOSEPAK) 4 MG TBPK tablet Take 1-6 tablets by mouth See admin instructions. Take 6 tabs by mouth the first day, then decrease dosage by 1 tablet each day until complete.   unknown  . montelukast (SINGULAIR) 10 MG tablet TAKE ONE TABLET BY MOUTH AT BEDTIME (Patient taking differently:  Take 10 mg by mouth at bedtime. ) 30 tablet 0 unknown  . nitroGLYCERIN (NITROSTAT) 0.4 MG SL tablet      . promethazine (PHENERGAN) 25 MG tablet Take 25 mg by mouth 4 (four) times daily as needed for nausea or vomiting.    unknown  . TRELEGY ELLIPTA 100-62.5-25 MCG/INH AEPB Inhale 1 Dose into the lungs daily. Rinse, gargle, and spit after use. 26 each 5 unknown    Assessment: 56 YOF diagnosed with COVID. She developed Afib during her ICU stay with CHADSVASc score of 7 (Age, female gender, HTN, stroke, CHF). She was on IV heparin for Afib until 7/9 when she experienced bloody secretions for which heparin was stopped and then resumed on 7/10.   AC: On 7/11, patient's Plt count had trended down to 110 today. 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 Pharmacy consulted to stop IV heparin and start [place]. On 7/12 Pt now with confirmed UE DVT.   Today, 12/28/18 - aPTT 45, subtherapeutic - H/H improved, Plt improved today  - Upper extremity duplex shows DVT - no bleeding or line issues reported per RN staff   Goal of Therapy:  aPTT 50-85 seconds Monitor platelets by anticoagulation protocol: Yes   Plan:  - Increase bivalirudin to 0.06 mg/kg/hr -Recheck aPTT in 2 hours -Will need to monitor platelet trend closely -HIT panel pending    Royetta Asal, PharmD, BCPS 12/28/2018 3:53 AM

## 2018-12-28 NOTE — Progress Notes (Signed)
Dark red sediments & flecks noted in pt's urine. This is a new finding. Called Pharmacist, Merrilee Seashore, to tell him. Awaiting APTT results. Will pass on info to [place] said he would pass on to day-shift Pharmacist.

## 2018-12-28 NOTE — Progress Notes (Signed)
Critical ABG values given to Dr. Lake Bells. RR changed to 35.

## 2018-12-28 NOTE — Progress Notes (Signed)
ANTICOAGULATION CONSULT NOTE - Follow Up Consult  Pharmacy Consult for bivalirudin Indication: atrial fibrillation/ r/o HIT, DVT  Allergies  Allergen Reactions  . Ace Inhibitors Other (See Comments) and Cough    CHEST PAIN  . Heparin Other (See Comments)    HITT  . Other     Patient reports receiving blood after a miscarriage "years ago". She states she broke out from receiving this blood. Has not received any since  . Codeine Nausea And Vomiting  . Levaquin [Levofloxacin] Nausea And Vomiting    Patient Measurements: Height: 4\' 10"  (147.3 cm) Weight: 226 lb 3.1 oz (102.6 kg) IBW/kg (Calculated) : 40.9 Heparin Dosing Weight:   Vital Signs: Temp: 100.3 F (37.9 C) (07/13 0310) Temp Source: Oral (07/13 0310) BP: 117/63 (07/13 0700) Pulse Rate: 112 (07/13 0700)  Labs: Recent Labs    12/25/18 2235 12/26/18 0500 12/26/18 0900  12/27/18 0205  12/27/18 1400 12/27/18 1941 12/28/18 0101  HGB  --  10.8*  --   --  11.8*  --   --   --  12.4  HCT  --  35.4*  --   --  37.5  --   --   --  38.7  PLT  --  110*  --   --  127*  --   --   --  163  APTT  --   --   --    < > 43*   < > 25 35 45*  HEPARINUNFRC 0.87*  --  0.10*  --   --   --   --   --   --   CREATININE  --  0.90  --   --  0.89  --   --   --  1.14*   < > = values in this interval not displayed.    Estimated Creatinine Clearance: 43.5 mL/min (A) (by C-G formula based on SCr of 1.14 mg/dL (H)).   Medications: Infusions:  . sodium chloride Stopped (12/27/18 0340)  . bivalirudin (ANGIOMAX) infusion 0.5 mg/mL (Non-ACS indications) 0.06 mg/kg/hr (12/28/18 0400)  . dexmedetomidine (PRECEDEX) IV infusion    . feeding supplement (VITAL HIGH PROTEIN) 1,000 mL (12/27/18 1800)  . HYDROmorphone 4 mg/hr (12/28/18 0400)  . meropenem (MERREM) IV 200 mL/hr at 12/28/18 0300  . midazolam 5 mg/hr (12/28/18 0400)  . norepinephrine (LEVOPHED) Adult infusion 16.939 mcg/min (12/28/18 0400)  . [START ON 12/29/2018] vancomycin       Assessment: 25 YOF diagnosed with COVID. She developed Afib during her ICU stay with CHADSVASc score of 7 (Age, female gender, HTN, stroke, CHF). She was on IV heparin for Afib until 7/9 when she experienced bloody secretions for which heparin was stopped and then resumed on 7/10. 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 Pharmacy consulted to stop IV heparin and start [place].  On 7/12 Pt now with confirmed UE DVT.   Today, 12/28/18 - aPTT 49, just below therapeutic goal - H/H improved, Plt improved today to 163 - no line issues reported per RN staff - RN reports dark red sediments & flecks noted in pt's urine.  No hematuria.     Goal of Therapy:  aPTT 50-85 seconds Monitor platelets by anticoagulation protocol: Yes   Plan:  - Continue bivalirudin 0.06 mg/kg/hr -Recheck aPTT in 2 hours -monitor platelet trend closely -HIT panel pending     Gretta Arab PharmD, BCPS Clinical pharmacist phone 7am- 5pm: [phone] 12/28/2018 8:06  AM

## 2018-12-29 DIAGNOSIS — K5900 Constipation, unspecified: Secondary | ICD-10-CM

## 2018-12-29 LAB — BASIC METABOLIC PANEL
Anion gap: 10 (ref 5–15)
BUN: 63 mg/dL — ABNORMAL HIGH (ref 8–23)
CO2: 25 mmol/L (ref 22–32)
Calcium: 7.8 mg/dL — ABNORMAL LOW (ref 8.9–10.3)
Chloride: 99 mmol/L (ref 98–111)
Creatinine, Ser: 1.09 mg/dL — ABNORMAL HIGH (ref 0.44–1.00)
GFR calc Af Amer: 57 mL/min — ABNORMAL LOW (ref >60)
GFR calc non Af Amer: 49 mL/min — ABNORMAL LOW (ref >60)
Glucose, Bld: 191 mg/dL — ABNORMAL HIGH (ref 70–99)
Potassium: 4.6 mmol/L (ref 3.5–5.1)
Sodium: 134 mmol/L — ABNORMAL LOW (ref 135–145)

## 2018-12-29 LAB — CBC
HCT: 32.3 % — ABNORMAL LOW (ref 36.0–46.0)
Hemoglobin: 9.9 g/dL — ABNORMAL LOW (ref 12.0–15.0)
MCH: 31 pg (ref 26.0–34.0)
MCHC: 30.7 g/dL (ref 30.0–36.0)
MCV: 101.3 fL — ABNORMAL HIGH (ref 80.0–100.0)
Platelets: 110 10*3/uL — ABNORMAL LOW (ref 150–400)
RBC: 3.19 MIL/uL — ABNORMAL LOW (ref 3.87–5.11)
RDW: 16.8 % — ABNORMAL HIGH (ref 11.5–15.5)
WBC: 20.4 10*3/uL — ABNORMAL HIGH (ref 4.0–10.5)
nRBC: 0.1 % (ref 0.0–0.2)

## 2018-12-29 LAB — GLUCOSE, CAPILLARY
Glucose-Capillary: 161 mg/dL — ABNORMAL HIGH (ref 70–99)
Glucose-Capillary: 169 mg/dL — ABNORMAL HIGH (ref 70–99)
Glucose-Capillary: 171 mg/dL — ABNORMAL HIGH (ref 70–99)
Glucose-Capillary: 172 mg/dL — ABNORMAL HIGH (ref 70–99)
Glucose-Capillary: 188 mg/dL — ABNORMAL HIGH (ref 70–99)
Glucose-Capillary: 202 mg/dL — ABNORMAL HIGH (ref 70–99)
Glucose-Capillary: 209 mg/dL — ABNORMAL HIGH (ref 70–99)
Glucose-Capillary: 233 mg/dL — ABNORMAL HIGH (ref 70–99)

## 2018-12-29 LAB — PROCALCITONIN: Procalcitonin: 0.31 ng/mL

## 2018-12-29 LAB — D-DIMER, QUANTITATIVE: D-Dimer, Quant: 7.66 ug/mL-FEU — ABNORMAL HIGH (ref 0.00–0.50)

## 2018-12-29 LAB — CULTURE, RESPIRATORY W GRAM STAIN: Culture: NORMAL

## 2018-12-29 LAB — APTT
aPTT: 51 seconds — ABNORMAL HIGH (ref 24–36)
aPTT: 54 seconds — ABNORMAL HIGH (ref 24–36)

## 2018-12-29 MED ORDER — SODIUM CHLORIDE 0.9 % IV SOLN
0.0600 mg/kg/h | INTRAVENOUS | Status: DC
  Administered 2018-12-29: 0.06 mg/kg/h via INTRAVENOUS
  Filled 2018-12-29: qty 250

## 2018-12-29 MED ORDER — HEPARIN (PORCINE) 25000 UT/250ML-% IV SOLN
500.0000 [IU]/h | INTRAVENOUS | Status: DC
  Administered 2018-12-29: 500 [IU]/h via INTRAVENOUS
  Filled 2018-12-29: qty 250

## 2018-12-29 MED ORDER — POLYETHYLENE GLYCOL 3350 17 G PO PACK
17.0000 g | PACK | Freq: Two times a day (BID) | ORAL | Status: DC
  Administered 2018-12-29 – 2018-12-30 (×3): 17 g
  Filled 2018-12-29 (×3): qty 1

## 2018-12-29 MED ORDER — FREE WATER
200.0000 mL | Freq: Three times a day (TID) | Status: DC
  Administered 2018-12-29 – 2018-12-31 (×7): 200 mL

## 2018-12-29 MED ORDER — BISACODYL 10 MG RE SUPP
10.0000 mg | Freq: Every day | RECTAL | Status: DC | PRN
  Administered 2018-12-29: 10 mg via RECTAL
  Filled 2018-12-29: qty 1

## 2018-12-29 MED ORDER — FLEET ENEMA 7-19 GM/118ML RE ENEM: 1.0000 | ENEMA | Freq: Every day | RECTAL | Status: DC | PRN

## 2018-12-29 MED ORDER — SENNA 8.6 MG PO TABS
2.0000 | ORAL_TABLET | Freq: Every day | ORAL | Status: DC
  Administered 2018-12-29: 17.2 mg
  Filled 2018-12-29: qty 2

## 2018-12-29 NOTE — Progress Notes (Signed)
NAME:  NESHA COUNIHAN, MRN:  [id number], DOB:  Jun 25, 1940, LOS: 44 ADMISSION DATE:  11/22/2018, CONSULTATION DATE:  6/29 REFERRING MD:  Sloan Leiter, CHIEF COMPLAINT:  Dyspnea   Brief History   78 y/o female with a history of diastolic heart failure admitted on June 29 for ARDS in setting of COVID 19 pneumonia.  Required intubation at [facility] emergency room.  Past Medical History  Diastolic heart failure History of stroke (mini stroke) Obstructive sleep apnea on CPAP Hypertension Hyperlipidemia Coronary artery disease, had PCI in 0865 Chronic diastolic heart failure Chronic kidney disease GERD Allergic rhinitis  Significant Hospital Events   June 29 admission July 2 severe vent dyssynchrony July 3 oxygenation worsening overnight, tvol 12cc/kg IB on SIMV, changed to  [facility], paralytic used July 4 no acute events July 5 paralytic used July 6 no acute changed, fever, sedation minimized July 7 Afib July 8 severe dyssyncrhony, large tidal volumes, diaphoresis, fentanyl drip added back, low dose versed, changed back to [facility] July 9 more comfortable, stable TVol, severe ventilator dyssynchrony lead increased intrathoracic pressure, bradycardia requiring atropine emergently.  Change sedation from fentanyl to Dilaudid, improved in a later synchrony July 10 stable, weaning vent, diuresing July 12 increased FiO2 overnight, hypotensive started on levophed, thrombocytopenic started on argatroban July 13 increased work of breathing, hemoptysis on rounds, afib with RVR on rounds emergent bronc showed laceration just distal to the endotracheal tube in the trach  Consults:  PCCM  Procedures:  June 29 endotracheal tube> June 29 right internal jugular central venous line>June 29 June 30 PICC >   Significant Diagnostic Tests:  7/6 CT head > hold infarcts 7/7 LE Doppler negative for DVT July 13 bronchoscopy small laceration just distal to the endotracheal tube tip in trachea  Micro Data:   June 26 SARS-COV-2 Positivie July 5 Urine > staph epidermidis July 12 resp > neg  Antimicrobials:  June 29 remdesivir June29Actemra June 29 solumedrol - decadron 7/1 >> June 29 convalescent plasma  ............................. 7/5  zyvox > 7/6 7/5 - cefepime > 7.6  7/12 cefepime>  7/12 vanc > 7/14  Interim history/subjective:  July 13 emergent bronch for hemoptysis, laceration noted trachea Remains on vasopressors Remains heavily sedated, on neuromuscular blockade  Objective   Blood pressure 130/85, pulse (!) 111, temperature 99.5 F (37.5 C), temperature source Oral, resp. rate (!) 28, height 4\' 10"  (1.473 m), weight 104.3 kg, SpO2 95 %.    Vent Mode: PRVC FiO2 (%):  [70 %-100 %] 70 % Set Rate:  [30 bmp-35 bmp] 35 bmp Vt Set:  [320 mL] 320 mL PEEP:  [8 cmH20-12 cmH20] 12 cmH20 Plateau Pressure:  [32 cmH20-34 cmH20] 33 cmH20   Intake/Output Summary (Last 24 hours) at 12/29/2018 0757 Last data filed at 12/29/2018 0600 Gross per 24 hour  Intake 1964.79 ml  Output 1680 ml  Net 284.79 ml   Filed Weights   12/27/18 0428 12/28/18 0500 12/29/18 0449  Weight: 100.5 kg 102.6 kg 104.3 kg    Examination:  General:  In bed on vent HENT: NCAT ETT in place PULM: CTA B, vent supported breathing CV: RRR, no mgr GI: BS+, soft, nontender MSK: normal bulk and tone Neuro: sedated on vent  Chest x-ray from July 12 images independently reviewed showing severe bilateral airspace disease, endotracheal tube in place, cardiomegaly   Resolved Hospital Problem list   Bradycardia: improved with better vent synchrony AKI : resolved  Assessment & Plan:  ARDS due to COVID 19 pneumonia: minimal improvement over  last 15 days Severe ventilator dyssynchrony leading to air trapping a vagal response Hemoptysis 7/13 > laceration in trachea, improved 7/14 Overall prognosis: poor, slim chance of survival; would take weeks to months to improve, unclear if she would want this Plan Consider  palliative care consultation Continue to engage family goals of care conversation Continue mechanical ventilation per ARDS protocol Target TVol 6-8cc/kgIBW Target Plateau Pressure < 30cm H20 Target driving pressure less than 15 cm of water Target PaO2 55-65: titrate PEEP/FiO2 per protocol As long as PaO2 to FiO2 ratio is less than 1:150 position in prone position for 16 hours a day Check CVP daily if CVL in place Target CVP less than 4, diurese as necessary Ventilator associated pneumonia prevention protocol Restart Angiomax today monitor for bleeding Try to stop neuromuscular blockade, monitor closely for ventilator dyssynchrony as these episodes have been severe in the last week  New Right upper extremity DVT Restart Angiomax  Atrial fib with RVR: resolved Tele monitor  Shock 7/12, with encephalopathy, rising WBC count: HCAP? COVID? Continue meropenem 5 days  ICU delirium: For now continue sedation as needed for ventilator synchrony  Diastolic heart failure    Best practice:  Diet: tube feeding Pain/Anxiety/Delirium protocol (if indicated): RASS goal -2 minimize sedation VAP protocol (if indicated): Yes DVT prophylaxis: heparin infusion GI prophylaxis: yes Pantoprazole for stress ulcer prophylaxis Glucose control: SSI Mobility: bed rest Code Status: DNR Family Communication: per [facility] Disposition: remain in ICU  Labs   CBC: Recent Labs  Lab 12/25/18 0954 12/26/18 0500 12/27/18 0205 12/28/18 0101 12/28/18 1321 12/28/18 1545  WBC 11.0* 11.2* 16.1* 33.1*  --  26.6*  HGB 10.8* 10.8* 11.8* 12.4 10.5* 10.7*  HCT 36.0 35.4* 37.5 38.7 31.0* 33.7*  MCV 100.3* 100.3* 99.7 98.5  --  101.5*  PLT 133* 110* 127* 163  --  126*    Basic Metabolic Panel: Recent Labs  Lab 12/24/18 0425  12/25/18 0441 12/26/18 0500 12/27/18 0205 12/27/18 0500 12/28/18 0101 12/28/18 1321  NA 144   < > 141 136 137  --  137 133*  K 4.4   < > 4.6 4.0 3.8  --  4.8 4.9  CL 108  --  106  99 101  --  102  --   CO2 26  --  28 29 27   --  23  --   GLUCOSE 196*  --  164* 126* 116*  --  188*  --   BUN 65*  --  63* 65* 62*  --  68*  --   CREATININE 0.97  --  0.92 0.90 0.89  --  1.14*  --   CALCIUM 8.0*  --  8.1* 7.8* 7.5*  --  8.2*  --   MG  --   --   --   --   --  2.2  --   --    < > = values in this interval not displayed.   GFR: Estimated Creatinine Clearance: 43.9 mL/min (A) (by C-G formula based on SCr of 1.14 mg/dL (H)). Recent Labs  Lab 12/26/18 0500 12/27/18 0205 12/28/18 0101 12/28/18 1545 12/29/18 0445  PROCALCITON  --   --  0.61  --  0.31  WBC 11.2* 16.1* 33.1* 26.6*  --     Liver Function Tests: Recent Labs  Lab 12/23/18 0612 12/24/18 0425  AST 30 27  ALT 23 24  ALKPHOS 84 80  BILITOT 0.3 0.3  PROT 4.6* 4.4*  ALBUMIN 2.0* 1.9*   No  results for input(s): LIPASE, AMYLASE in the last 168 hours. No results for input(s): AMMONIA in the last 168 hours.  ABG    Component Value Date/Time   PHART 7.154 (LL) 12/28/2018 1321   PCO2ART 78.6 (HH) 12/28/2018 1321   PO2ART 91.0 12/28/2018 1321   HCO3 27.3 12/28/2018 1321   TCO2 30 12/28/2018 1321   ACIDBASEDEF 2.0 12/28/2018 1321   O2SAT 93.0 12/28/2018 1321     Coagulation Profile: No results for input(s): INR, PROTIME in the last 168 hours.  Cardiac Enzymes: No results for input(s): CKTOTAL, CKMB, CKMBINDEX, TROPONINI in the last 168 hours.  HbA1C: Hgb A1c MFr Bld  Date/Time Value Ref Range Status  12/20/2018 04:06 AM 6.4 (H) 4.8 - 5.6 % Final    Comment:    (NOTE) Pre diabetes:          5.7%-6.4% Diabetes:              >6.4% Glycemic control for   <7.0% adults with diabetes   11/08/2016 07:24 AM 5.6 4.8 - 5.6 % Final    Comment:    (NOTE)         Pre-diabetes: 5.7 - 6.4         Diabetes: >6.4         Glycemic control for adults with diabetes: <7.0     CBG: Recent Labs  Lab 12/28/18 1210 12/28/18 1554 12/28/18 1955 12/29/18 0015 12/29/18 0409  GLUCAP 151* 192* 197* 233* 202*      Critical care time: 93 mintues    Roselie Awkward, MD [place] [facility] Pager: [phone] Cell: [phone] If no response, call [phone]

## 2018-12-29 NOTE — Progress Notes (Signed)
ANTICOAGULATION CONSULT NOTE - Follow Up Consult  Pharmacy Consult for bivalirudin Indication: atrial fibrillation, +DVT  Consulted to transition bivalrudin back to heparin since HIT Ab negative.  Plan: - d/c bivalrudin - start heparin (no bolus) at 500 units/hr based on previous rate - check heparin level in [place], PharmD, [place]: [phone] 12/29/2018 5:23 PM

## 2018-12-29 NOTE — Progress Notes (Addendum)
ANTICOAGULATION CONSULT NOTE - Follow Up Consult  Pharmacy Consult for bivalirudin Indication: atrial fibrillation/ r/o HIT, +DVT  Allergies  Allergen Reactions  . Ace Inhibitors Other (See Comments) and Cough    CHEST PAIN  . Heparin Other (See Comments)    HITT  . Other     Patient reports receiving blood after a miscarriage "years ago". She states she broke out from receiving this blood. Has not received any since  . Codeine Nausea And Vomiting  . Levaquin [Levofloxacin] Nausea And Vomiting    Patient Measurements: Height: 4\' 10"  (147.3 cm) Weight: 229 lb 15 oz (104.3 kg) IBW/kg (Calculated) : 40.9  Vital Signs: Temp: 97.5 F (36.4 C) (07/14 0800) Temp Source: Axillary (07/14 0800) BP: 120/61 (07/14 1000) Pulse Rate: 102 (07/14 1000)  Labs: Recent Labs    12/27/18 0205  12/28/18 0101 12/28/18 0610 12/28/18 1023 12/28/18 1321 12/28/18 1545 12/29/18 0445  HGB 11.8*  --  12.4  --   --  10.5* 10.7* 9.9*  HCT 37.5  --  38.7  --   --  31.0* 33.7* 32.3*  PLT 127*  --  163  --   --   --  126* 110*  APTT 43*   < > 45* 49* 42*  --   --   --   CREATININE 0.89  --  1.14*  --   --   --   --  1.09*   < > = values in this interval not displayed.    Estimated Creatinine Clearance: 46 mL/min (A) (by C-G formula based on SCr of 1.09 mg/dL (H)).   Medications: Infusions:  . sodium chloride Stopped (12/27/18 0340)  . feeding supplement (VITAL HIGH PROTEIN) 50 mL/hr at 12/29/18 0600  . HYDROmorphone 5 mg/hr (12/29/18 1100)  . meropenem (MERREM) IV Stopped (12/29/18 1020)  . midazolam 6 mg/hr (12/29/18 1100)  . norepinephrine (LEVOPHED) Adult infusion 10 mcg/min (12/29/18 1100)  . vancomycin Stopped (12/29/18 0038)  . vecuronium (NORCURON) infusion Stopped (12/29/18 1000)    Assessment: 82 YOF diagnosed with COVID. She developed Afib during her ICU stay with CHADSVASc score of 7 (Age, female gender, HTN, stroke, CHF). She was on IV heparin for Afib until 7/9 when she  experienced bloody secretions for which heparin was stopped and then resumed on 7/10. 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 Pharmacy consulted to stop IV heparin and start [place].  On 7/12 confirmed UE DVT. On 7/13 Bivalirudin was held d/t profuse bleeding from tear in trachea identified during bronch.  On 7/14, bleeding has resolved, OK to resume Bivalirudin.  Today, 12/28/18 - aPTT pending. - CBC: Hgb remains low/stable at 9.9, Plt decreased to 110. - No bleeding and no line issues reported per RN staff - SCr 1.09 (slightly improved) with CrCl ~ 46 ml/min - AST/ALT, Tbili all WNL (last on 7/9)  Goal of Therapy:  aPTT 50-85 seconds Monitor platelets by anticoagulation protocol: Yes   Plan:  - Resume bivalirudin at 0.06 mg/kg/hr      - Resume previous low dose d/t nearly therapeutic on this dose and d/t recent bleeding.   - aPTT in 2 hours, then q12h once therapeutic x2 levels - monitor platelet trend closely  Gretta Arab PharmD, BCPS Clinical pharmacist phone 7am- 5pm: [phone] 12/29/2018 11:08 AM    Addendum: 2 hr APTT 51, therapeutic on Bivalirudin 0.06 mg/kg/hr Goal 50-85 sec Plan: - Continue bivalirudin at 0.06 mg/kg/hr -  aPTT in 2 hours, then q12h once therapeutic x2 levels  Gretta Arab PharmD, BCPS Clinical pharmacist phone 7am- 5pm: [phone] 12/29/2018 3:55 PM

## 2018-12-29 NOTE — Progress Notes (Signed)
PROGRESS NOTE  Jody Taylor TFT:[id number] DOB: 1940/09/10 DOA: 12/15/2018  PCP: Nicoletta Dress, MD  Brief History/Interval Summary: Patient is a 78 y.o. female with PMHx of HTN, CAD, chronic diastolic heart failure, severe persistent asthma on benralizumab, hypothyroidism, CKD stage III-presented to [facility] with acute respiratory distress-was found to have acute hypoxic respiratory failure secondary to COVID-19 pneumonia and emergently intubated and subsequently transferred to [facility] course complicated by ventilator dyssynchrony requiring prn paralytics, atrial fibrillation-and concern for encephalopathy as she has been at times not responsive even when off sedation.  See below for further details  Reason for Visit: Acute respiratory disease due to COVID-19  Consultants: Pulmonology  Procedures:  Intubation.   Right internal jugular central venous catheter placement 6/29  Transthoracic echocardiogram 1. The left ventricle has normal systolic function with an ejection fraction of 60-65%. The cavity size was normal. There is moderate asymmetric left ventricular hypertrophy. Left ventricular diastolic parameters were normal.  2. The right ventricle has normal systolic function. The cavity was normal. There is no increase in right ventricular wall thickness.  3. No evidence of mitral valve stenosis.  4. No stenosis of the aortic valve.  5. The aortic root and ascending aorta are normal in size and structure.  6. The interatrial septum was not assessed.   Antibiotics: Anti-infectives (From admission, onward)   Start     Dose/Rate Route Frequency Ordered Stop   12/29/18 0000  vancomycin (VANCOCIN) 1,500 mg in sodium chloride 0.9 % 500 mL IVPB  Status:  Discontinued     1,500 mg 250 mL/hr over 120 Minutes Intravenous Every 36 hours 12/27/18 1254 12/28/18 1639   12/29/18 0000  vancomycin (VANCOCIN) IVPB 1000 mg/200 mL premix     1,000 mg 200 mL/hr  over 60 Minutes Intravenous Every 36 hours 12/28/18 1639     12/28/18 2200  meropenem (MERREM) 1 g in sodium chloride 0.9 % 100 mL IVPB     1 g 200 mL/hr over 30 Minutes Intravenous Every 12 hours 12/28/18 1639     12/27/18 1130  vancomycin (VANCOCIN) 2,000 mg in sodium chloride 0.9 % 500 mL IVPB     2,000 mg 250 mL/hr over 120 Minutes Intravenous  Once 12/27/18 1048 12/27/18 1430   12/27/18 1130  meropenem (MERREM) 1 g in sodium chloride 0.9 % 100 mL IVPB  Status:  Discontinued     1 g 200 mL/hr over 30 Minutes Intravenous Every 8 hours 12/27/18 1048 12/28/18 1639   12/20/18 1230  linezolid (ZYVOX) IVPB 600 mg  Status:  Discontinued     600 mg 300 mL/hr over 60 Minutes Intravenous Every 12 hours 12/20/18 1210 12/21/18 1227   12/20/18 1230  ceFEPIme (MAXIPIME) 2 g in sodium chloride 0.9 % 100 mL IVPB  Status:  Discontinued     2 g 200 mL/hr over 30 Minutes Intravenous Every 12 hours 12/20/18 1229 12/22/18 1113   12/15/18 2000  remdesivir 100 mg in sodium chloride 0.9 % 250 mL IVPB     100 mg 500 mL/hr over 30 Minutes Intravenous Every 24 hours 12/08/2018 1845 12/18/18 2053   12/02/2018 2000  remdesivir 200 mg in sodium chloride 0.9 % 250 mL IVPB     200 mg 500 mL/hr over 30 Minutes Intravenous Once 12/11/2018 1845 11/19/2018 2116       Subjective/Interval History: Patient is intubated and sedated.  No further bleeding noted from the endotracheal tube.      Assessment/Plan:  Acute Hypoxic Resp. Failure due to Acute Covid 19 Viral Illness/ARDS  Vent Mode: PRVC FiO2 (%):  [65 %-100 %] 65 % Set Rate:  [35 bmp] 35 bmp Vt Set:  [320 mL] 320 mL PEEP:  [12 cmH20] 12 cmH20 Plateau Pressure:  [32 cmH20-34 cmH20] 33 cmH20     Component Value Date/Time   PHART 7.154 (LL) 12/28/2018 1321   PCO2ART 78.6 (HH) 12/28/2018 1321   PO2ART 91.0 12/28/2018 1321   HCO3 27.3 12/28/2018 1321   TCO2 30 12/28/2018 1321   ACIDBASEDEF 2.0 12/28/2018 1321   O2SAT 93.0 12/28/2018 1321    COVID-19  Labs  Recent Labs    12/28/18 0101 12/29/18 0445  DDIMER 12.46* 7.66*     Fever: Afebrile. Oxygen requirements: Mechanical ventilation.  60% FiO2.  Saturating in the early 90s.   Antibiotics: On vancomycin and meropenem, day 3.  Previously was on cefepime and linezolid for 3 days. Remdesivir: Completed course of Remdesivir Steroids: Completed course of steroids. Diuretics: He was given Lasix for 3 days but not in the last 3 days due to hypotension Actemra: Received Actemra on 6/29 at [facility] Plasma: Received convalescent plasma on 6/30 Vitamin C and Zinc: Continue DVT Prophylaxis: Continue IV heparin.    Patient remains intubated and sedated.  Pulmonology is following.  Her FiO2 requirements have improved today.  Over the last 3 days she did have significant worsening in her WBC and was noted to have low-grade fever.  Tracheal aspirate cultures were sent.  Patient was started on vancomycin and meropenem.  WBC has improved.  Procalcitonin 0.31.  Improved from 0.61.  Tracheal aspirate cultures without any growth.  Blood cultures have been unremarkable previously.  Continue antibiotics for now.  Repeat chest x-ray tomorrow.  Hemoptysis Patient was noted to have bleeding from the endotracheal tube yesterday.  Emergent bronchoscopy done by pulmonology which showed a possible laceration in the trachea.  Her Angiomax was held.  Mild drop in hemoglobin noted.  However she has not had any further episodes of bleeding.  Okay to resume Angiomax today.  Monitor closely for signs of bleeding.  Atrial fibrillation with RVR While she was undergoing bronchoscopy patient had tachycardia.  She was noted to be in A. fib with RVR.  She was given metoprolol intravenously with the control of heart rate.  Heart rate noted to be stable this morning.  Remains in atrial fibrillation.  TSH and free T4 have been checked previously and have been normal.  She had echocardiogram done during this  hospitalization which did not show any concerning findings.  Patient was on IV heparin but was stopped due to drop in platelet counts and concern for HIT.  Patient was switched over to Angiomax.  It was held yesterday due to bleeding from the endotracheal tube.  Will be resumed today.   Thrombocytopenia Patient had drop in platelet count.  There was concern for HIT.  HIT antibodies are normal.  Angiomax was held yesterday due to bleeding from the endotracheal tube.  Will be resumed today.     Acute metabolic encephalopathy Encephalopathy is most likely due to acute illness.  CT head done on 7/6 did not show any acute findings.  Continue to monitor  Acute DVT right upper extremity In the setting of PICC line.  A central line was placed yesterday.  PICC line to be removed.  Currently on Angiomax.  Steroid-induced hyperglycemia in the setting of known prediabetes HbA1c 6.4.  CBGs are reasonably well controlled.  She is on Levemir and SSI.  Monitor CBGs closely.    Hypotension Patient is requiring Levophed.     Hypernatremia Resolved with free water.  Cut back on free water.  Episodes of bradycardia She became bradycardic on 7/9 due to vagal stimulation while being suctioned.  Had to be given atropine.  Elevated d-dimer D-dimer has been improving.  7.66 today.  Peak of 15.77 on 7/11.  She does have a DVT in the right upper extremity.    Chronic kidney disease stage III Renal function remains close to baseline. Monitor urine output.  History of coronary artery disease Continue aspirin.  History of chronic diastolic CHF Patient had significant weight gain in the hospital.  This has improved with diuresis  History of severe persistent asthma No wheezing noted.  Patient was on benralizumab as outpatient.  History of dyslipidemia Continue statin.  History of hypothyroidism Continue levothyroxine.  TSH and free T4 were normal.  History of GERD Continue PPI  History of anxiety and  depression Stable.  Positive urine cultures Urine culture grew staph epidermidis.  Blood cultures negative.  Patient's procalcitonin level was less than 0.1.  Patient was given cefepime and linezolid for about 3 days.  Due to clinical stability patient was not given any additional antibiotics at that time.  Nutrition Continue tube feedings.  Constipation Per nursing staff she has not had a bowel movement in many days.  Increased dose of laxatives.  Suppositories as needed.  Enema as needed.  Goals of care Patient has had multiple setbacks.  Seems to be stable currently.  Prognosis remains guarded.  We will continue to discuss with family.   DVT Prophylaxis: Angiomax PUD Prophylaxis: Protonix Code Status: DNR Family Communication: Discussed with son on a daily basis Disposition Plan: Remain in ICU   Medications:  Scheduled:  artificial tears  1 application Both Eyes Z6X   aspirin  81 mg Per Tube Daily   chlorhexidine  15 mL Mouth/Throat BID   Chlorhexidine Gluconate Cloth  6 each Topical Daily   free water  350 mL Per Tube Q4H    HYDROmorphone (DILAUDID) injection  0.5 mg Intravenous Once   insulin aspart  0-15 Units Subcutaneous Q4H   insulin detemir  20 Units Subcutaneous Daily   levothyroxine  50 mcg Intravenous Daily   mouth rinse  15 mL Mouth Rinse 10 times per day   pantoprazole sodium  40 mg Per Tube Daily   sodium chloride flush  10-40 mL Intracatheter Q12H   vitamin C  500 mg Per Tube Daily   zinc sulfate  220 mg Per Tube Daily   Continuous:  sodium chloride Stopped (12/27/18 0340)   feeding supplement (VITAL HIGH PROTEIN) 50 mL/hr at 12/29/18 0600   HYDROmorphone 5 mg/hr (12/29/18 1020)   meropenem (MERREM) IV 1 g (12/29/18 0948)   midazolam 6 mg/hr (12/29/18 0900)   norepinephrine (LEVOPHED) Adult infusion 10 mcg/min (12/29/18 0900)   vancomycin Stopped (12/29/18 0038)   vecuronium (NORCURON) infusion 1.2995 mcg/kg/min (12/29/18 0900)    WRU:[id number], atropine, hydrALAZINE, HYDROmorphone, ipratropium-albuterol, metoprolol tartrate, midazolam, midazolam, nitroGLYCERIN, [DISCONTINUED] ondansetron **OR** ondansetron (ZOFRAN) IV, polyethylene glycol   Objective:  Vital Signs  Vitals:   12/29/18 0433 12/29/18 0449 12/29/18 0500 12/29/18 0800  BP:   130/85 116/60  Pulse:   (!) 111 99  Resp:   (!) 28 (!) 35  Temp: 99.5 F (37.5 C)   (!) 97.5 F (36.4 C)  TempSrc: Oral   Axillary  SpO2:  95% 94%  Weight:  104.3 kg    Height:        Intake/Output Summary (Last 24 hours) at 12/29/2018 1054 Last data filed at 12/29/2018 1000 Gross per 24 hour  Intake 2488.39 ml  Output 1880 ml  Net 608.39 ml   Filed Weights   12/27/18 0428 12/28/18 0500 12/29/18 0449  Weight: 100.5 kg 102.6 kg 104.3 kg    General appearance: Intubated and sedated Resp: Coarse breath sounds bilaterally.  Crackles at the bases.  No wheezing or rhonchi.   Cardio: S1-S2 is irregularly irregular.  Mildly tachycardic.  Telemetry shows atrial fibrillation. GI: Abdomen is soft.  Nontender nondistended.  Bowel sounds are present normal.  No masses organomegaly Extremities: Improved edema bilateral lower extremities Neurologic: Remains sedated.  Not really responding much.   Lab Results:  Data Reviewed: I have personally reviewed following labs and imaging studies  CBC: Recent Labs  Lab 12/26/18 0500 12/27/18 0205 12/28/18 0101 12/28/18 1321 12/28/18 1545 12/29/18 0445  WBC 11.2* 16.1* 33.1*  --  26.6* 20.4*  HGB 10.8* 11.8* 12.4 10.5* 10.7* 9.9*  HCT 35.4* 37.5 38.7 31.0* 33.7* 32.3*  MCV 100.3* 99.7 98.5  --  101.5* 101.3*  PLT 110* 127* 163  --  126* 110*    Basic Metabolic Panel: Recent Labs  Lab 12/25/18 0441 12/26/18 0500 12/27/18 0205 12/27/18 0500 12/28/18 0101 12/28/18 1321 12/29/18 0445  NA 141 136 137  --  137 133* 134*  K 4.6 4.0 3.8  --  4.8 4.9 4.6  CL 106 99 101  --  102  --  99  CO2 28 29 27   --  23  --  25   GLUCOSE 164* 126* 116*  --  188*  --  191*  BUN 63* 65* 62*  --  68*  --  63*  CREATININE 0.92 0.90 0.89  --  1.14*  --  1.09*  CALCIUM 8.1* 7.8* 7.5*  --  8.2*  --  7.8*  MG  --   --   --  2.2  --   --   --     GFR: Estimated Creatinine Clearance: 46 mL/min (A) (by C-G formula based on SCr of 1.09 mg/dL (H)).  Liver Function Tests: Recent Labs  Lab 12/23/18 0612 12/24/18 0425  AST 30 27  ALT 23 24  ALKPHOS 84 80  BILITOT 0.3 0.3  PROT 4.6* 4.4*  ALBUMIN 2.0* 1.9*    CBG: Recent Labs  Lab 12/28/18 1554 12/28/18 1955 12/29/18 0015 12/29/18 0409 12/29/18 0757  GLUCAP 192* 197* 233* 202* 171*     Recent Results (from the past 240 hour(s))  Culture, respiratory (non-expectorated)     Status: None   Collection Time: 12/20/18 12:03 PM   Specimen: Tracheal Aspirate; Respiratory  Result Value Ref Range Status   Specimen Description   Final    TRACHEAL ASPIRATE Performed at [facility] [address], [place], [place] [address]    Special Requests   Final    Immunocompromised Performed at [facility], [place] [address] [place], [place] [address]    Gram Stain   Final    FEW WBC PRESENT, PREDOMINANTLY PMN RARE SQUAMOUS EPITHELIAL CELLS PRESENT MODERATE GRAM POSITIVE COCCI IN CLUSTERS RARE GRAM POSITIVE RODS RARE BUDDING YEAST SEEN    Culture   Final    FEW Consistent with normal respiratory flora. Performed at [facility], [place] [address] [place], [place] [address]  Report Status 12/23/2018 FINAL  Final  Culture, Urine     Status: Abnormal   Collection Time: 12/20/18 12:03 PM   Specimen: Urine, Clean Catch  Result Value Ref Range Status   Specimen Description   Final    URINE, CLEAN CATCH Performed at [facility], [place] [address], [place], [place] [address]    Special Requests   Final    Immunocompromised Performed at [facility], [place] [address], [place], [place]  [address]    Culture >=100,000 COLONIES/mL STAPHYLOCOCCUS EPIDERMIDIS (A)  Final   Report Status 12/22/2018 FINAL  Final   Organism ID, Bacteria STAPHYLOCOCCUS EPIDERMIDIS (A)  Final      Susceptibility   Staphylococcus epidermidis - MIC*    CIPROFLOXACIN >=8 RESISTANT Resistant     GENTAMICIN <=0.5 SENSITIVE Sensitive     NITROFURANTOIN <=16 SENSITIVE Sensitive     OXACILLIN >=4 RESISTANT Resistant     TETRACYCLINE <=1 SENSITIVE Sensitive     VANCOMYCIN 2 SENSITIVE Sensitive     TRIMETH/SULFA <=10 SENSITIVE Sensitive     CLINDAMYCIN <=0.25 SENSITIVE Sensitive     RIFAMPIN <=0.5 SENSITIVE Sensitive     Inducible Clindamycin NEGATIVE Sensitive     * >=100,000 COLONIES/mL STAPHYLOCOCCUS EPIDERMIDIS  Culture, blood (Routine X 2) w Reflex to ID Panel     Status: None   Collection Time: 12/20/18 12:40 PM   Specimen: BLOOD  Result Value Ref Range Status   Specimen Description   Final    BLOOD LEFT ANTECUBITAL Performed at [facility] [address], [place], [place] [address]    Special Requests   Final    BOTTLES DRAWN AEROBIC ONLY Blood Culture adequate volume Performed at [facility] [address], [place], [place] [address]    Culture   Final    NO GROWTH 5 DAYS Performed at [facility], [place] [address] [place], [place] [address]    Report Status 12/25/2018 FINAL  Final  Culture, blood (Routine X 2) w Reflex to ID Panel     Status: None   Collection Time: 12/20/18 12:45 PM   Specimen: BLOOD  Result Value Ref Range Status   Specimen Description   Final    BLOOD LEFT HAND Performed at [facility] [address], [place], [place] [address]    Special Requests   Final    BOTTLES DRAWN AEROBIC ONLY Blood Culture adequate volume Performed at [facility] [address], [place], [place] [address]    Culture   Final    NO GROWTH 5 DAYS Performed at [facility], [place] [address]  [place], [place] [address]    Report Status 12/25/2018 FINAL  Final  Culture, respiratory (non-expectorated)     Status: None   Collection Time: 12/27/18 12:38 PM   Specimen: Tracheal Aspirate; Respiratory  Result Value Ref Range Status   Specimen Description   Final    TRACHEAL ASPIRATE Performed at [facility] [address], [place], [place] [address]    Special Requests   Final    NONE Performed at [facility], [place] [address], [place], [place] [address]    Gram Stain   Final    FEW WBC PRESENT,BOTH PMN AND MONONUCLEAR NO ORGANISMS SEEN    Culture   Final    FEW Consistent with normal respiratory flora. Performed at [facility], [place] [address], [place], [place] [address]  Report Status 12/29/2018 FINAL  Final      Radiology Studies: Dg Chest Port 1 View  Result Date: 12/27/2018 CLINICAL DATA:  Status post central line placement EXAM: PORTABLE CHEST 1 VIEW COMPARISON:  Film from earlier in the same day. FINDINGS: Endotracheal tube and feeding catheter are again identified and stable. New left jugular central line is noted with the catheter tip in the left innominate vein just short of the superior vena cava. No pneumothorax is noted. The right-sided PICC line is again noted within the right innominate vein just short of the SVC. Cardiac shadow is stable. Patchy infiltrates are again identified bilaterally. IMPRESSION: No change in patchy airspace disease. No pneumothorax following left jugular central line. The catheter tip is noted in the left innominate vein. Electronically Signed   By: Inez Catalina M.D.   On: 12/27/2018 19:51   Dg Chest Port 1 View  Result Date: 12/27/2018 CLINICAL DATA:  Acute respiratory failure with hypoxemia. COVID-19 infection. On ventilator. EXAM: PORTABLE CHEST 1 VIEW COMPARISON:  12/26/2018 FINDINGS: Endotracheal tube, feeding tube, and right arm PICC line remain in satisfactory position. Heart size is within  normal limits. Diffuse bilateral airspace disease is again seen, without significant change. No evidence of pneumothorax or pleural effusion. IMPRESSION: Diffuse bilateral airspace disease, without significant change. Electronically Signed   By: Marlaine Hind M.D.   On: 12/27/2018 11:32   Vas [place] Upper Extremity Venous Duplex  Result Date: 12/28/2018 UPPER VENOUS STUDY  Indications: Edema, and PICC line. Covid positive. Ventilator Limitations: Ventilator, PICC line, bandages, edema, and body habitus. Comparison Study: No prior study on file for comparison. Performing Technologist: Sharion Dove RVS  Examination Guidelines: A complete evaluation includes B-mode imaging, spectral Doppler, color Doppler, and power Doppler as needed of all accessible portions of each vessel. Bilateral testing is considered an integral part of a complete examination. Limited examinations for reoccurring indications may be performed as noted.  Right Findings: +----------+------------+---------+-----------+----------+--------------+  RIGHT      Compressible Phasicity Spontaneous Properties    Summary      +----------+------------+---------+-----------+----------+--------------+  IJV            Full        Yes        Yes                                +----------+------------+---------+-----------+----------+--------------+  Subclavian                 Yes        Yes                                +----------+------------+---------+-----------+----------+--------------+  Axillary       None        No         No                     Acute       +----------+------------+---------+-----------+----------+--------------+  Brachial                   No         No                     Acute       +----------+------------+---------+-----------+----------+--------------+  Radial  Not visualized  +----------+------------+---------+-----------+----------+--------------+  Ulnar                                                     Not visualized  +----------+------------+---------+-----------+----------+--------------+  Cephalic       None                                          Acute       +----------+------------+---------+-----------+----------+--------------+  Basilic        Full                                                      +----------+------------+---------+-----------+----------+--------------+  Left Findings: +----------+------------+---------+-----------+----------+-------+  LEFT       Compressible Phasicity Spontaneous Properties Summary  +----------+------------+---------+-----------+----------+-------+  Subclavian                 Yes        Yes                         +----------+------------+---------+-----------+----------+-------+  Summary:  Right: No evidence of deep vein thrombosis in the . However, unable to visualize the Radial, ulnar. No evidence of superficial vein thrombosis in the . However, unable to visualize the Radial, ulnar. No evidence of thrombosis in the . However, unable to visualize the Radial, ulnar. Findings consistent with acute deep vein thrombosis involving the right axillary vein and right brachial veins. Findings consistent with acute superficial vein thrombosis involving the right cephalic vein. This was a limited study.  Left: No evidence of thrombosis in the subclavian.  *See table(s) above for measurements and observations.  Diagnosing physician: Monica Martinez MD Electronically signed by Monica Martinez MD on 12/28/2018 at 5:08:19 PM.    Final        LOS: 15 days   [facility] Pager on [email or web address]  12/29/2018, 10:54 AM

## 2018-12-29 NOTE — Progress Notes (Deleted)
Pt's daughter, Margaretha Sheffield, called and given an update on her mom's condition and plan of care. Margaretha Sheffield expressed that she wanted to schedule a group tele visit with her mom and 4 other family members with [facility]. [employer] called and will set up this family tele visit for around 4:30 or 5pm today.

## 2018-12-29 NOTE — Progress Notes (Signed)
Nutrition Follow-up RD working remotely.  DOCUMENTATION CODES:   Morbid obesity  INTERVENTION:    Continue Vital High Protein at 50 ml/h via Cortrak to provide 1200 kcal, 105 gm protein, 1003 ml free water daily. Meets 100% of estimated needs.  NUTRITION DIAGNOSIS:   Inadequate oral intake related to inability to eat as evidenced by NPO status.  Ongoing   GOAL:   Provide needs based on ASPEN/SCCM guidelines  Met with TF  MONITOR:   Vent status, TF tolerance, I & O's, Labs  ASSESSMENT:   78 yo female admitted with acute respiratory failure r/t COVID pneumonitis requiring intubation. PMH of severe asthma, anemia, CAD, CHF, vitamin D deficiency, HTN, HLD, GERD, CKD-3.  Patient has a DVT in her RUE. Per review of progress notes, patient is slowly declining with poor prognosis.  S/P bronchoscopy 7/13, found to have a trachea laceration.  Patient remains intubated on ventilator support MV: 10.8 L/min Temp (24hrs), Avg:99 F (37.2 C), Min:97.5 F (36.4 C), Max:99.6 F (37.6 C)   Patient is currently receiving Vital High Protein via Cortrak tube at 50 ml/h (1200 ml/day) to provide 1200 kcals, 105 gm protein, 1003 ml free water daily.    Labs reviewed. Sodium 134 (L), BUN 63 (H), creatinine 1.09 (H) CBG's: [phone]  Medications reviewed and include levophed, novolog, levemir, vitamin C, zinc.  I/O net +19.4 L Weight is up 13.9 kg +4 edema to BUE and +3 edema to BLE per RN documentation   Diet Order:   Diet Order    None      EDUCATION NEEDS:   No education needs have been identified at this time  Skin:  Skin Assessment: Reviewed RN Assessment(MASD to abdominal skin folds)  Last BM:  7/2 (type7)  Height:   Ht Readings from Last 1 Encounters:  12/15/2018 '4\' 10"'  (1.473 m)    Weight:   Wt Readings from Last 1 Encounters:  12/29/18 104.3 kg    Ideal Body Weight:  43.9 kg  BMI:  Body mass index is 48.06 kg/m.  Estimated Nutritional Needs:    Kcal:  [phone]  Protein:  90-110 gm  Fluid:  >/= 1.5 L    Molli Barrows, RD, LDN, [place] Pager [phone] After Hours Pager [phone]

## 2018-12-29 NOTE — Progress Notes (Deleted)
Pt's daughter, Margaretha Sheffield, called and given an update on her mom's condition and plan of care. Margaretha Sheffield expressed that she wanted to schedule a group tele visit with her mom and 4 other family members with [facility]. ELink called and will set up this family tele visit for around 4:30 or 5pm today.

## 2018-12-29 NOTE — Progress Notes (Signed)
Assisted tele visit to patient with family member.  Rexann Lueras R, RN  

## 2018-12-29 NOTE — Progress Notes (Addendum)
Pt's daughter, Margaretha Sheffield, called and given update on her mom's condition and plan of care. Margaretha Sheffield expressed that she wanted to set up a group tele visit with her mom and 4 other family members with [facility]. ELink called and will set up this family tele visit for 4:30/5pm today.

## 2018-12-30 ENCOUNTER — Inpatient Hospital Stay (HOSPITAL_COMMUNITY): Payer: Medicare HMO

## 2018-12-30 LAB — CBC
HCT: 32.9 % — ABNORMAL LOW (ref 36.0–46.0)
Hemoglobin: 10 g/dL — ABNORMAL LOW (ref 12.0–15.0)
MCH: 31.1 pg (ref 26.0–34.0)
MCHC: 30.4 g/dL (ref 30.0–36.0)
MCV: 102.2 fL — ABNORMAL HIGH (ref 80.0–100.0)
Platelets: 112 10*3/uL — ABNORMAL LOW (ref 150–400)
RBC: 3.22 MIL/uL — ABNORMAL LOW (ref 3.87–5.11)
RDW: 17 % — ABNORMAL HIGH (ref 11.5–15.5)
WBC: 16.3 10*3/uL — ABNORMAL HIGH (ref 4.0–10.5)
nRBC: 0.1 % (ref 0.0–0.2)

## 2018-12-30 LAB — BASIC METABOLIC PANEL
Anion gap: 7 (ref 5–15)
BUN: 66 mg/dL — ABNORMAL HIGH (ref 8–23)
CO2: 26 mmol/L (ref 22–32)
Calcium: 8.4 mg/dL — ABNORMAL LOW (ref 8.9–10.3)
Chloride: 104 mmol/L (ref 98–111)
Creatinine, Ser: 1.15 mg/dL — ABNORMAL HIGH (ref 0.44–1.00)
GFR calc Af Amer: 54 mL/min — ABNORMAL LOW (ref >60)
GFR calc non Af Amer: 46 mL/min — ABNORMAL LOW (ref >60)
Glucose, Bld: 180 mg/dL — ABNORMAL HIGH (ref 70–99)
Potassium: 5.4 mmol/L — ABNORMAL HIGH (ref 3.5–5.1)
Sodium: 137 mmol/L (ref 135–145)

## 2018-12-30 LAB — HEPARIN LEVEL (UNFRACTIONATED)
Heparin Unfractionated: <0.1 IU/mL — ABNORMAL LOW (ref 0.30–0.70)
Heparin Unfractionated: <0.1 IU/mL — ABNORMAL LOW (ref 0.30–0.70)
Heparin Unfractionated: 0.17 IU/mL — ABNORMAL LOW (ref 0.30–0.70)

## 2018-12-30 LAB — GLUCOSE, CAPILLARY
Glucose-Capillary: 159 mg/dL — ABNORMAL HIGH (ref 70–99)
Glucose-Capillary: 161 mg/dL — ABNORMAL HIGH (ref 70–99)
Glucose-Capillary: 150 mg/dL — ABNORMAL HIGH (ref 70–99)
Glucose-Capillary: 167 mg/dL — ABNORMAL HIGH (ref 70–99)
Glucose-Capillary: 244 mg/dL — ABNORMAL HIGH (ref 70–99)
Glucose-Capillary: 263 mg/dL — ABNORMAL HIGH (ref 70–99)

## 2018-12-30 LAB — C-REACTIVE PROTEIN: CRP: 3.8 mg/dL — ABNORMAL HIGH (ref <1.0)

## 2018-12-30 MED ORDER — HEPARIN BOLUS VIA INFUSION
2000.0000 [IU] | Freq: Once | INTRAVENOUS | Status: AC
  Administered 2018-12-30: 2000 [IU] via INTRAVENOUS
  Filled 2018-12-30: qty 2000

## 2018-12-30 MED ORDER — HEPARIN (PORCINE) 25000 UT/250ML-% IV SOLN
950.0000 [IU]/h | INTRAVENOUS | Status: DC
  Administered 2018-12-30: 950 [IU]/h via INTRAVENOUS
  Filled 2018-12-30: qty 250

## 2018-12-30 MED ORDER — SODIUM POLYSTYRENE SULFONATE 15 GM/60ML PO SUSP
30.0000 g | Freq: Once | ORAL | Status: AC
  Administered 2018-12-30: 30 g
  Filled 2018-12-30: qty 120

## 2018-12-30 MED ORDER — VECURONIUM BOLUS VIA INFUSION
10.0000 mg | Freq: Once | INTRAVENOUS | Status: AC
  Administered 2018-12-30: 17:00:00 10 mg via INTRAVENOUS
  Filled 2018-12-30: qty 10

## 2018-12-30 MED ORDER — HEPARIN (PORCINE) 25000 UT/250ML-% IV SOLN
600.0000 [IU]/h | INTRAVENOUS | Status: DC
  Administered 2018-12-30: 600 [IU]/h via INTRAVENOUS

## 2018-12-30 NOTE — Progress Notes (Signed)
ANTICOAGULATION CONSULT NOTE - Follow Up Consult  Pharmacy Consult for Heparin Indication: atrial fibrillation, +DVT  Allergies  Allergen Reactions  . Ace Inhibitors Other (See Comments) and Cough    CHEST PAIN  . Other     Patient reports receiving blood after a miscarriage "years ago". She states she broke out from receiving this blood. Has not received any since  . Codeine Nausea And Vomiting  . Levaquin [Levofloxacin] Nausea And Vomiting    Patient Measurements: Height: 4\' 10"  (147.3 cm) Weight: 239 lb 10.2 oz (108.7 kg) IBW/kg (Calculated) : 40.9  Heparin dosing weight:   Vital Signs: Temp: 100.1 F (37.8 C) (07/15 0830) Temp Source: Axillary (07/15 0830) BP: 105/66 (07/15 0930) Pulse Rate: 121 (07/15 0930)  Labs: Recent Labs    12/28/18 0101  12/28/18 1023  12/28/18 1545 12/29/18 0445 12/29/18 1435 12/29/18 1710 12/30/18 0130  HGB 12.4  --   --    < > 10.7* 9.9*  --   --  10.0*  HCT 38.7  --   --    < > 33.7* 32.3*  --   --  32.9*  PLT 163  --   --   --  126* 110*  --   --  112*  APTT 45*   < > 42*  --   --   --  51* 54*  --   HEPARINUNFRC  --   --   --   --   --   --   --   --  <0.10*  CREATININE 1.14*  --   --   --   --  1.09*  --   --  1.15*   < > = values in this interval not displayed.    Estimated Creatinine Clearance: 44.7 mL/min (A) (by C-G formula based on SCr of 1.15 mg/dL (H)).   Medications: Infusions:  . sodium chloride Stopped (12/27/18 0340)  . feeding supplement (VITAL HIGH PROTEIN) Stopped (12/30/18 0000)  . heparin 600 Units/hr (12/30/18 0700)  . HYDROmorphone 3 mg/hr (12/30/18 0700)  . meropenem (MERREM) IV 1 g (12/30/18 0932)  . midazolam 2 mg/hr (12/30/18 0700)  . norepinephrine (LEVOPHED) Adult infusion 4 mcg/min (12/30/18 0735)  . vecuronium (NORCURON) infusion Stopped (12/29/18 1000)    Assessment: 51 YOF diagnosed with COVID. She developed Afib during her ICU stay with CHADSVASc score of 7 (Age, female gender, HTN, stroke,  CHF). She was on IV heparin for Afib until 7/9 when she experienced bloody secretions for which heparin was stopped and then resumed on 7/10. 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 Pharmacy consulted to stop IV heparin and start [place].  On 7/12 confirmed UE DVT. On 7/13 Bivalirudin was held d/t profuse bleeding from tear in trachea identified during bronch.  On 7/14, bleeding has resolved, OK to resume Bivalirudin, then transitioned back to Heparin when HIT Ab negative.  Today, 12/30/2018:  HL <0.1, subtherapeutic on heparin at 600 units/hr.  Heparin was started at a conservative rate w/o bolus given previous therapeutic heparin levels on this dose and recent bleeding.  Due to 2 undetectable levels and no further bleeding, we will now increase rate and bolus.  CBC:  Hgb remains low/stable around 10.  Plt remain low at 112.  (Baseline Plt 205 on admission)    No bleeding or line issues.  No further trachea bleeding.    Goal of Therapy:  aPTT 50-85 seconds Monitor platelets  by anticoagulation protocol: Yes   Plan:   Give heparin 2000 units bolus IV x 1  Increase to heparin IV infusion at 800 units/hr  Heparin level 8 hours after rate change.  Daily heparin level and CBC  Continue to monitor for s/s bleeding.   Gretta Arab PharmD, BCPS Clinical pharmacist phone 7am- 5pm: [phone] 12/30/2018 1:03 PM

## 2018-12-30 NOTE — Progress Notes (Signed)
ANTICOAGULATION CONSULT NOTE - Follow Up Consult  Pharmacy Consult for bivalirudin Indication: atrial fibrillation/ r/o HIT, +DVT  Allergies  Allergen Reactions  . Ace Inhibitors Other (See Comments) and Cough    CHEST PAIN  . Other     Patient reports receiving blood after a miscarriage "years ago". She states she broke out from receiving this blood. Has not received any since  . Codeine Nausea And Vomiting  . Levaquin [Levofloxacin] Nausea And Vomiting    Patient Measurements: Height: 4\' 10"  (147.3 cm) Weight: 229 lb 15 oz (104.3 kg) IBW/kg (Calculated) : 40.9  Vital Signs: Temp: 98.8 F (37.1 C) (07/15 0400) Temp Source: Oral (07/15 0400) BP: 94/71 (07/15 0500) Pulse Rate: 69 (07/15 0500)  Labs: Recent Labs    12/28/18 0101  12/28/18 1023  12/28/18 1545 12/29/18 0445 12/29/18 1435 12/29/18 1710 12/30/18 0130  HGB 12.4  --   --    < > 10.7* 9.9*  --   --  10.0*  HCT 38.7  --   --    < > 33.7* 32.3*  --   --  32.9*  PLT 163  --   --   --  126* 110*  --   --  112*  APTT 45*   < > 42*  --   --   --  51* 54*  --   HEPARINUNFRC  --   --   --   --   --   --   --   --  <0.10*  CREATININE 1.14*  --   --   --   --  1.09*  --   --  1.15*   < > = values in this interval not displayed.    Estimated Creatinine Clearance: 43.6 mL/min (A) (by C-G formula based on SCr of 1.15 mg/dL (H)).   Medications: Infusions:  . sodium chloride Stopped (12/27/18 0340)  . feeding supplement (VITAL HIGH PROTEIN) Stopped (12/30/18 0000)  . heparin 600 Units/hr (12/30/18 0500)  . HYDROmorphone 3 mg/hr (12/30/18 0500)  . meropenem (MERREM) IV Stopped (12/29/18 2148)  . midazolam 2 mg/hr (12/30/18 0500)  . norepinephrine (LEVOPHED) Adult infusion Stopped (12/30/18 0055)  . vecuronium (NORCURON) infusion Stopped (12/29/18 1000)    Assessment: 33 YOF diagnosed with COVID. She developed Afib during her ICU stay with CHADSVASc score of 7 (Age, female gender, HTN, stroke, CHF). She was on IV  heparin for Afib until 7/9 when she experienced bloody secretions for which heparin was stopped and then resumed on 7/10. 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 Pharmacy consulted to stop IV heparin and start [place].  On 7/12 confirmed UE DVT. On 7/13 Bivalirudin was held d/t profuse bleeding from tear in trachea identified during bronch.  On 7/14, bleeding has resolved, OK to resume Bivalirudin.  7/14: heparin restarted after pt was HIT antibody negative   Today, 12/28/18  Initial HL after restart is 0.10, subtherapeutic   Hgb stable, plt low but stable  No bleeding or line issues  Goal of Therapy:  aPTT 50-85 seconds Monitor platelets by anticoagulation protocol: Yes   Plan:   In light of recent bleeding issues, will not bolus  Increase heparin to 600 units/hr  HL 8 hours after rate change  Monitor for signs and symptoms of bleeding    Royetta Asal, PharmD, BCPS 12/30/2018 5:25 AM

## 2018-12-30 NOTE — Progress Notes (Signed)
PROGRESS NOTE  JHERI MITTER IOX:[id number] DOB: 1941-02-12 DOA: 11/25/2018  PCP: Nicoletta Dress, MD  Brief History/Interval Summary:  - Patient is a 78 y.o. female with PMHx of HTN, CAD, chronic diastolic heart failure, severe persistent asthma on benralizumab, hypothyroidism, CKD stage III-presented to [facility] with acute respiratory distress-was found to have acute hypoxic respiratory failure secondary to COVID-19 pneumonia and emergently intubated and subsequently transferred to [facility] course complicated by ventilator dyssynchrony requiring prn paralytics, atrial fibrillation-and concern for encephalopathy as she has been at times not responsive even when off sedation.  See below for further details  Subjective/Interval History: Patient is intubated and sedated.  No further bleeding noted from the endotracheal tube.      Assessment/Plan:  Acute Hypoxic Resp. Failure due to Acute Covid 19 Viral Illness/ARDS  Vent Mode: PRVC FiO2 (%):  [60 %-90 %] 80 % Set Rate:  [35 bmp] 35 bmp Vt Set:  [320 mL] 320 mL PEEP:  [12 cmH20] 12 cmH20 Plateau Pressure:  [27 cmH20-33 cmH20] 27 cmH20     Component Value Date/Time   PHART 7.154 (LL) 12/28/2018 1321   PCO2ART 78.6 (HH) 12/28/2018 1321   PO2ART 91.0 12/28/2018 1321   HCO3 27.3 12/28/2018 1321   TCO2 30 12/28/2018 1321   ACIDBASEDEF 2.0 12/28/2018 1321   O2SAT 93.0 12/28/2018 1321    COVID-19 Labs  Recent Labs    12/28/18 0101 12/29/18 0445  DDIMER 12.46* 7.66*     Fever: Afebrile. Oxygen requirements: Mechanical ventilation.  FiO2 requirement has improved to 35%  Antibiotics: On vancomycin and meropenem, day 3.  Previously was on cefepime and linezolid for 3 days. Remdesivir: Completed course of Remdesivir Steroids: Completed course of steroids. Diuretics: He was given Lasix for 3 days but no more due to hypotension Actemra: Received Actemra on 6/29 at [facility]  Plasma: Received convalescent plasma on 6/30 Vitamin C and Zinc: Continue DVT Prophylaxis: Continue IV heparin.    Patient remains intubated and sedated.  Pulmonology is following.  Her FiO2 requirements continues to improve, she had worsening leukocytosis, low-grade temperature, continue with broad-spectrum antibiotics, follow on tracheal aspirate.Patient was started on vancomycin and meropenem.  WBC has improved.  Procalcitonin 0.31.  Improved from 0.61.  Tracheal aspirate cultures without any growth.  Blood cultures have been unremarkable previously.  Continue antibiotics for now.  Repeat chest x-ray tomorrow.  Hemoptysis Patient was noted to have bleeding from the endotracheal tube 7/13.  Emergent bronchoscopy done by pulmonology which showed a possible laceration in the trachea.  Her Angiomax was held.  Mild drop in hemoglobin noted.  However she has not had any further episodes of bleeding.  Okay to resume Angiomax.  No further signs of bleeding, hemoglobin is stable  Atrial fibrillation with RVR While she was undergoing bronchoscopy patient had tachycardia.  She was noted to be in A. fib with RVR.  She was given metoprolol intravenously with the control of heart rate.  Heart rate noted to be stable this morning.  Remains in atrial fibrillation.  TSH and free T4 have been checked previously and have been normal.  She had echocardiogram done during this hospitalization which did not show any concerning findings.  Patient was on IV heparin but was stopped due to drop in platelet counts and concern for HIT.  Patient was switched over to Angiomax.  It was held yesterday due to bleeding from the endotracheal tube.  Will be resumed today.   Thrombocytopenia  Patient had drop in platelet count.  There was concern for HIT.  HIT antibodies are normal.  Angiomax was held yesterday due to bleeding from the endotracheal tube.  Will be resumed today.     Acute metabolic encephalopathy Encephalopathy is most  likely due to acute illness.  CT head done on 7/6 did not show any acute findings.  Continue to monitor  Acute DVT right upper extremity In the setting of PICC line.  A central line was placed 7/13.  PICC line to be removed.  Currently on Angiomax.  Steroid-induced hyperglycemia in the setting of known prediabetes HbA1c 6.4.  CBGs are reasonably well controlled.  She is on Levemir and SSI.  Monitor CBGs closely.    Hypotension Patient is requiring Levophed.     Hypernatremia Resolved with free water.  Cut back on free water.  Episodes of bradycardia She became bradycardic on 7/9 due to vagal stimulation while being suctioned.  Had to be given atropine.  Elevated d-dimer D-dimer has been improving.  7.66 today.  Peak of 15.77 on 7/11.  She does have a DVT in the right upper extremity.    Chronic kidney disease stage III Renal function remains close to baseline. Monitor urine output.  History of coronary artery disease Continue aspirin.  History of chronic diastolic CHF Patient had significant weight gain in the hospital.  This has improved with diuresis  History of severe persistent asthma No wheezing noted.  Patient was on benralizumab as outpatient.  History of dyslipidemia Continue statin.  History of hypothyroidism Continue levothyroxine.  TSH and free T4 were normal.  History of GERD Continue PPI  History of anxiety and depression Stable.  Positive urine cultures Urine culture grew staph epidermidis.  Blood cultures negative.  Patient's procalcitonin level was less than 0.1.  Patient was given cefepime and linezolid for about 3 days.  Due to clinical stability patient was not given any additional antibiotics at that time.  Nutrition Continue tube feedings.  Constipation Per nursing staff she has not had a bowel movement in many days.  Increased dose of laxatives.  Suppositories as needed.  Enema as needed.  Goals of care Patient has had multiple setbacks.   Seems to be stable currently.  Prognosis remains guarded.  We will continue to discuss with family.   DVT Prophylaxis: Angiomax PUD Prophylaxis: Protonix Code Status: DNR Family Communication: Discussed with son on a daily basis Disposition Plan: Remain in ICU  Anti-infectives (From admission, onward)   Start     Dose/Rate Route Frequency Ordered Stop   12/29/18 0000  vancomycin (VANCOCIN) 1,500 mg in sodium chloride 0.9 % 500 mL IVPB  Status:  Discontinued     1,500 mg 250 mL/hr over 120 Minutes Intravenous Every 36 hours 12/27/18 1254 12/28/18 1639   12/29/18 0000  vancomycin (VANCOCIN) IVPB 1000 mg/200 mL premix  Status:  Discontinued     1,000 mg 200 mL/hr over 60 Minutes Intravenous Every 36 hours 12/28/18 1639 12/29/18 1708   12/28/18 2200  meropenem (MERREM) 1 g in sodium chloride 0.9 % 100 mL IVPB     1 g 200 mL/hr over 30 Minutes Intravenous Every 12 hours 12/28/18 1639 01/02/19 2359   12/27/18 1130  vancomycin (VANCOCIN) 2,000 mg in sodium chloride 0.9 % 500 mL IVPB     2,000 mg 250 mL/hr over 120 Minutes Intravenous  Once 12/27/18 1048 12/27/18 1430   12/27/18 1130  meropenem (MERREM) 1 g in sodium chloride 0.9 % 100 mL  IVPB  Status:  Discontinued     1 g 200 mL/hr over 30 Minutes Intravenous Every 8 hours 12/27/18 1048 12/28/18 1639   12/20/18 1230  linezolid (ZYVOX) IVPB 600 mg  Status:  Discontinued     600 mg 300 mL/hr over 60 Minutes Intravenous Every 12 hours 12/20/18 1210 12/21/18 1227   12/20/18 1230  ceFEPIme (MAXIPIME) 2 g in sodium chloride 0.9 % 100 mL IVPB  Status:  Discontinued     2 g 200 mL/hr over 30 Minutes Intravenous Every 12 hours 12/20/18 1229 12/22/18 1113   12/15/18 2000  remdesivir 100 mg in sodium chloride 0.9 % 250 mL IVPB     100 mg 500 mL/hr over 30 Minutes Intravenous Every 24 hours 11/23/2018 1845 12/18/18 2053   11/19/2018 2000  remdesivir 200 mg in sodium chloride 0.9 % 250 mL IVPB     200 mg 500 mL/hr over 30 Minutes Intravenous Once  12/09/2018 1845 12/03/2018 2116     Medications:  Scheduled: . artificial tears  1 application Both Eyes D6U  . aspirin  81 mg Per Tube Daily  . chlorhexidine  15 mL Mouth/Throat BID  . Chlorhexidine Gluconate Cloth  6 each Topical Daily  . free water  200 mL Per Tube Q8H  .  HYDROmorphone (DILAUDID) injection  0.5 mg Intravenous Once  . insulin aspart  0-15 Units Subcutaneous Q4H  . insulin detemir  20 Units Subcutaneous Daily  . levothyroxine  50 mcg Intravenous Daily  . mouth rinse  15 mL Mouth Rinse 10 times per day  . pantoprazole sodium  40 mg Per Tube Daily  . polyethylene glycol  17 g Per Tube BID  . senna  2 tablet Per Tube QHS  . sodium chloride flush  10-40 mL Intracatheter Q12H  . vitamin C  500 mg Per Tube Daily  . zinc sulfate  220 mg Per Tube Daily   Continuous: . sodium chloride Stopped (12/27/18 0340)  . feeding supplement (VITAL HIGH PROTEIN) 50 mL/hr at 12/30/18 1300  . heparin 800 Units/hr (12/30/18 1323)  . HYDROmorphone 4 mg/hr (12/30/18 1544)  . meropenem (MERREM) IV 10 mL/hr at 12/30/18 1500  . midazolam 3 mg/hr (12/30/18 1543)  . norepinephrine (LEVOPHED) Adult infusion 6 mcg/min (12/30/18 1500)  . vecuronium (NORCURON) infusion Stopped (12/29/18 1000)   YQI:[id number], atropine, bisacodyl, hydrALAZINE, HYDROmorphone, ipratropium-albuterol, metoprolol tartrate, midazolam, midazolam, nitroGLYCERIN, [DISCONTINUED] ondansetron **OR** ondansetron (ZOFRAN) IV, sodium phosphate   Objective:  Vital Signs  Vitals:   12/30/18 1500 12/30/18 1515 12/30/18 1530 12/30/18 1545  BP: 100/72 103/67 103/67 (!) 90/57  Pulse: (!) 127 (!) 116 (!) 122 (!) 123  Resp: (!) 30 (!) 35 (!) 26 (!) 34  Temp:    (!) 101 F (38.3 C)  TempSrc:    Axillary  SpO2: 92% 93% (!) 87% 92%  Weight:      Height:        Intake/Output Summary (Last 24 hours) at 12/30/2018 1557 Last data filed at 12/30/2018 1533 Gross per 24 hour  Intake 1823.86 ml  Output 1735 ml  Net 88.86 ml    Filed Weights   12/28/18 0500 12/29/18 0449 12/30/18 0500  Weight: 102.6 kg 104.3 kg 108.7 kg    Sedated, intubated, in no apparent distress Symmetrical Chest wall movement, Good air movement bilaterally, coarse bilaterally Irregular irregular,No Gallops,Rubs or new Murmurs, No Parasternal Heave +ve B.Sounds, Abd Soft, No tenderness, No rebound - guarding or rigidity. No Cyanosis, Clubbing , +1 edema, No  new Rash or bruise      Lab Results:  Data Reviewed: I have personally reviewed following labs and imaging studies  CBC: Recent Labs  Lab 12/27/18 0205 12/28/18 0101 12/28/18 1321 12/28/18 1545 12/29/18 0445 12/30/18 0130  WBC 16.1* 33.1*  --  26.6* 20.4* 16.3*  HGB 11.8* 12.4 10.5* 10.7* 9.9* 10.0*  HCT 37.5 38.7 31.0* 33.7* 32.3* 32.9*  MCV 99.7 98.5  --  101.5* 101.3* 102.2*  PLT 127* 163  --  126* 110* 112*    Basic Metabolic Panel: Recent Labs  Lab 12/26/18 0500 12/27/18 0205 12/27/18 0500 12/28/18 0101 12/28/18 1321 12/29/18 0445 12/30/18 0130  NA 136 137  --  137 133* 134* 137  K 4.0 3.8  --  4.8 4.9 4.6 5.4*  CL 99 101  --  102  --  99 104  CO2 29 27  --  23  --  25 26  GLUCOSE 126* 116*  --  188*  --  191* 180*  BUN 65* 62*  --  68*  --  63* 66*  CREATININE 0.90 0.89  --  1.14*  --  1.09* 1.15*  CALCIUM 7.8* 7.5*  --  8.2*  --  7.8* 8.4*  MG  --   --  2.2  --   --   --   --     GFR: Estimated Creatinine Clearance: 44.7 mL/min (A) (by C-G formula based on SCr of 1.15 mg/dL (H)).  Liver Function Tests: Recent Labs  Lab 12/24/18 0425  AST 27  ALT 24  ALKPHOS 80  BILITOT 0.3  PROT 4.4*  ALBUMIN 1.9*    CBG: Recent Labs  Lab 12/29/18 1941 12/29/18 2314 12/30/18 0313 12/30/18 0820 12/30/18 1157  GLUCAP 172* 169* 159* 161* 150*     Recent Results (from the past 240 hour(s))  Culture, respiratory (non-expectorated)     Status: None   Collection Time: 12/27/18 12:38 PM   Specimen: Tracheal Aspirate; Respiratory  Result Value Ref  Range Status   Specimen Description   Final    TRACHEAL ASPIRATE Performed at [facility] [address], [place], [place] [address]    Special Requests   Final    NONE Performed at [facility], [place] [address] [place], [place] [address]    Gram Stain   Final    FEW WBC PRESENT,BOTH PMN AND MONONUCLEAR NO ORGANISMS SEEN    Culture   Final    FEW Consistent with normal respiratory flora. Performed at [facility], [place] [address], [place], [place] [address]    Report Status 12/29/2018 FINAL  Final      Radiology Studies: Dg Chest Port 1 View  Result Date: 12/30/2018 CLINICAL DATA:  COVID-19 pneumonia. EXAM: PORTABLE CHEST 1 VIEW COMPARISON:  12/27/2018 FINDINGS: The endotracheal tube is in good position, 4 cm above the carina. The left IJ central venous catheter is stable with its tip at the brachiocephalic SVC junction. The right PICC line is been removed. Feeding tube coursing down the esophagus and into the stomach. The cardiac silhouette, mediastinal and hilar contours are stable. Improved lung aeration with decreasing airspace opacities. No pleural effusions. No pneumothorax. IMPRESSION: 1. Support apparatus in good position without complicating features. The right PICC line has been removed since the prior chest x-ray. 2. Improved lung aeration with clearing infiltrates. Electronically Signed   By: Marijo Sanes M.D.   On: 12/30/2018 09:07       LOS:  63 days   Phillips Climes MD  [facility] Pager on [email or web address]  12/30/2018, 3:57 PM

## 2018-12-30 NOTE — Progress Notes (Signed)
NAME:  Jody Taylor, MRN:  [id number], DOB:  31-Jul-1940, LOS: 75 ADMISSION DATE:  12/11/2018, CONSULTATION DATE:  6/29 REFERRING MD:  Sloan Leiter, CHIEF COMPLAINT:  Dyspnea   Brief History   78 y/o female with a history of diastolic heart failure admitted on June 29 for ARDS in setting of COVID 19 pneumonia.  Required intubation at [facility] emergency room.  Past Medical History  Diastolic heart failure History of stroke (mini stroke) Obstructive sleep apnea on CPAP Hypertension Hyperlipidemia Coronary artery disease, had PCI in 6734 Chronic diastolic heart failure Chronic kidney disease GERD Allergic rhinitis  Significant Hospital Events   June 29 admission July 2 severe vent dyssynchrony July 3 oxygenation worsening overnight, tvol 12cc/kg IB on SIMV, changed to [facility], paralytic used July 4 no acute events July 5 paralytic used July 6 no acute changed, fever, sedation minimized July 7 Afib July 8 severe dyssyncrhony, large tidal volumes, diaphoresis, fentanyl drip added back, low dose versed, changed back to [facility] July 9 more comfortable, stable TVol, severe ventilator dyssynchrony lead increased intrathoracic pressure, bradycardia requiring atropine emergently.  Change sedation from fentanyl to Dilaudid, improved in a later synchrony July 10 stable, weaning vent, diuresing July 12 increased FiO2 overnight, hypotensive started on levophed, thrombocytopenic started on argatroban July 13 increased work of breathing, hemoptysis on rounds, afib with RVR on rounds emergent bronc showed laceration just distal to the endotracheal tube in the trach July 14: July 13 emergent bronch for hemoptysis, laceration noted trachea Remains on vasopressors Remains heavily sedated, on neuromuscular blockade  Consults:  PCCM  Procedures:  June 29 endotracheal tube> June 29 right internal jugular central venous line>June 29 June 30 PICC >   Significant Diagnostic Tests:  7/6 CT head >  hold infarcts 7/7 LE Doppler negative for DVT July 13 bronchoscopy small laceration just distal to the endotracheal tube tip in trachea July 13 emergent bronch for hemoptysis, laceration noted trachea   Micro Data:  June 26 SARS-COV-2 Positivie July 5 Urine > staph epidermidis July 12 resp > neg  Antimicrobials:  June 29 remdesivir June29Actemra June 29 solumedrol - decadron 7/1 >> June 29 convalescent plasma  ............................. 7/5  zyvox > 7/6 7/5 - cefepime > 7.6  7/12 cefepime>  7/12 vanc > 7/14  Interim history/subjective:   December 30, 2018: 80% oxygen with a PEEP of 12.  On Levophed 4 mcg. Objective   Blood pressure 134/69, pulse (!) 123, temperature (!) 101 F (38.3 C), temperature source Axillary, resp. rate (!) 35, height 4\' 10"  (1.473 m), weight 108.7 kg, SpO2 91 %.    Vent Mode: PRVC FiO2 (%):  [60 %-90 %] 80 % Set Rate:  [35 bmp] 35 bmp Vt Set:  [320 mL] 320 mL PEEP:  [12 cmH20] 12 cmH20 Plateau Pressure:  [27 cmH20-33 cmH20] 27 cmH20   Intake/Output Summary (Last 24 hours) at 12/30/2018 1735 Last data filed at 12/30/2018 1700 Gross per 24 hour  Intake 1841.85 ml  Output 1575 ml  Net 266.85 ml   Filed Weights   12/28/18 0500 12/29/18 0449 12/30/18 0500  Weight: 102.6 kg 104.3 kg 108.7 kg    Examination: General Appearance:  Looks criticall ill OBESE - + Head:  Normocephalic, without obvious abnormality, atraumatic Eyes:  PERRL - yes, conjunctiva/corneas - muddy     Ears:  Normal external ear canals, both ears Nose:  G tube - no Throat:  ETT TUBE - yes , OG tube - yes Neck:  Supple,  No enlargement/tenderness/nodules  Lungs: Clear to auscultation bilaterally, Ventilator   Synchrony - yes, 80% fio3 Heart:  S1 and S2 normal, no murmur, CVP - no.  Pressors - levophed Abdomen:  Soft, no masses, no organomegaly Genitalia / Rectal:  Not done Extremities:  Extremities- intact Skin:  ntact in exposed areas . Sacral area - not examined  Neurologic:  Sedation -Dilaudid and Versed infusion-> RASS - -3   LABS    PULMONARY Recent Labs  Lab 12/24/18 1150 12/28/18 1321  PHART 7.394 7.154*  PCO2ART 45.0 78.6*  PO2ART 64.0* 91.0  HCO3 27.5 27.3  TCO2 29 30  O2SAT 92.0 93.0    CBC Recent Labs  Lab 12/28/18 1545 12/29/18 0445 12/30/18 0130  HGB 10.7* 9.9* 10.0*  HCT 33.7* 32.3* 32.9*  WBC 26.6* 20.4* 16.3*  PLT 126* 110* 112*    COAGULATION No results for input(s): INR in the last 168 hours.  CARDIAC  No results for input(s): TROPONINI in the last 168 hours. No results for input(s): PROBNP in the last 168 hours.   CHEMISTRY Recent Labs  Lab 12/26/18 0500 12/27/18 0205 12/27/18 0500 12/28/18 0101 12/28/18 1321 12/29/18 0445 12/30/18 0130  NA 136 137  --  137 133* 134* 137  K 4.0 3.8  --  4.8 4.9 4.6 5.4*  CL 99 101  --  102  --  99 104  CO2 29 27  --  23  --  25 26  GLUCOSE 126* 116*  --  188*  --  191* 180*  BUN 65* 62*  --  68*  --  63* 66*  CREATININE 0.90 0.89  --  1.14*  --  1.09* 1.15*  CALCIUM 7.8* 7.5*  --  8.2*  --  7.8* 8.4*  MG  --   --  2.2  --   --   --   --    Estimated Creatinine Clearance: 44.7 mL/min (A) (by C-G formula based on SCr of 1.15 mg/dL (H)).   LIVER Recent Labs  Lab 12/24/18 0425  AST 27  ALT 24  ALKPHOS 80  BILITOT 0.3  PROT 4.4*  ALBUMIN 1.9*     INFECTIOUS Recent Labs  Lab 12/28/18 0101 12/29/18 0445  PROCALCITON 0.61 0.31     ENDOCRINE CBG (last 3)  Recent Labs    12/30/18 0820 12/30/18 1157 12/30/18 1549  GLUCAP 161* 150* 167*         IMAGING x48h  - image(s) personally visualized  -   highlighted in bold Dg Chest Port 1 View  Result Date: 12/30/2018 CLINICAL DATA:  COVID-19 pneumonia. EXAM: PORTABLE CHEST 1 VIEW COMPARISON:  12/27/2018 FINDINGS: The endotracheal tube is in good position, 4 cm above the carina. The left IJ central venous catheter is stable with its tip at the brachiocephalic SVC junction. The right PICC line  is been removed. Feeding tube coursing down the esophagus and into the stomach. The cardiac silhouette, mediastinal and hilar contours are stable. Improved lung aeration with decreasing airspace opacities. No pleural effusions. No pneumothorax. IMPRESSION: 1. Support apparatus in good position without complicating features. The right PICC line has been removed since the prior chest x-ray. 2. Improved lung aeration with clearing infiltrates. Electronically Signed   By: Marijo Sanes M.D.   On: 12/30/2018 09:07         Resolved Hospital Problem list   Bradycardia: improved with better vent synchrony AKI : resolved  Assessment & Plan:  ARDS due to COVID 19 pneumonia: minimal improvement over last  15 days Severe ventilator dyssynchrony leading to air trapping a vagal response Hemoptysis 7/13 > laceration in trachea, improved 7/14 Overall prognosis: poor, slim chance of survival; would take weeks to months to improve, unclear if she would want this  12/30/2018 - > does not meet criteria for SBT/Extubation in setting of Acute Respiratory Failure due to severe ARDS that continues   Plan Consider palliative care consultation Continue to engage family goals of care conversation  Continue mechanical ventilation per ARDS protocol .ARDS Measure  - Target TVol 6-8cc/kgIBW  - Target Plateau Pressure < 30cm H20  - Target driving pressure less than 15 cm of water  - Target PaO2 55-65: titrate PEEP/FiO2 per protocol  - As long as PaO2 to FiO2 ratio is less than 1:150 position in prone position for 16 hours a day  Ventilator associated pneumonia prevention protocol  Try to stop neuromuscular blockade, monitor closely for ventilator dyssynchrony as these episodes have been severe in the last week  New Right upper extremity DVT Heparin infusion  Atrial fib with RVR: resolved Tele Monitor Heparin infusion  Shock 7/12, with encephalopathy, rising WBC count: HCAP? COVID? Continue meropenem 5  days  ICU delirium: For now continue sedation as needed for ventilator synchrony  Diastolic heart failure    Best practice:  Diet: tube feeding Pain/Anxiety/Delirium protocol (if indicated): RASS goal -2 minimize sedation VAP protocol (if indicated): Yes DVT prophylaxis: heparin infusion GI prophylaxis: yes Pantoprazole for stress ulcer prophylaxis Glucose control: SSI Mobility: bed rest Code Status: DNR  Family Communication: per TRH Disposition: remain in ICU      ATTESTATION & SIGNATURE   The patient Jody Taylor is critically ill with multiple organ systems failure and requires high complexity decision making for assessment and support, frequent evaluation and titration of therapies, application of advanced monitoring technologies and extensive interpretation of multiple databases.   Critical Care Time devoted to patient care services described in this note is  30  Minutes. This time reflects time of care of this signee Dr Brand Males. This critical care time does not reflect procedure time, or teaching time or supervisory time of PA/NP/Med student/Med Resident etc but could involve care discussion time     Dr. Brand Males, M.D., [facility].C.P Pulmonary and Critical Care Medicine Staff Physician [facility] Pager: [phone], If no answer or between  15:00h - 7:00h: call [phone]  12/30/2018 5:35 PM

## 2018-12-30 NOTE — Progress Notes (Signed)
Spoke with pt's daughter Carmon over the phone. I updated her on her mother's plan of care and answered any questions she had. Will plan for a facetime this afternoon with family.

## 2018-12-30 NOTE — Progress Notes (Signed)
ANTICOAGULATION CONSULT NOTE - Follow Up Consult  Pharmacy Consult for Heparin Indication: atrial fibrillation, +DVT  Allergies  Allergen Reactions  . Ace Inhibitors Other (See Comments) and Cough    CHEST PAIN  . Other     Patient reports receiving blood after a miscarriage "years ago". She states she broke out from receiving this blood. Has not received any since  . Codeine Nausea And Vomiting  . Levaquin [Levofloxacin] Nausea And Vomiting    Patient Measurements: Height: 4\' 10"  (147.3 cm) Weight: 239 lb 10.2 oz (108.7 kg) IBW/kg (Calculated) : 40.9  Heparin dosing weight:   Vital Signs: Temp: 99.3 F (37.4 C) (07/15 2000) Temp Source: Oral (07/15 2000) BP: 87/63 (07/15 2100) Pulse Rate: 132 (07/15 2100)  Labs: Recent Labs    12/28/18 0101  12/28/18 1023  12/28/18 1545 12/29/18 0445 12/29/18 1435 12/29/18 1710 12/30/18 0130 12/30/18 1125 12/30/18 2120  HGB 12.4  --   --    < > 10.7* 9.9*  --   --  10.0*  --   --   HCT 38.7  --   --    < > 33.7* 32.3*  --   --  32.9*  --   --   PLT 163  --   --   --  126* 110*  --   --  112*  --   --   APTT 45*   < > 42*  --   --   --  51* 54*  --   --   --   HEPARINUNFRC  --   --   --   --   --   --   --   --  <0.10* <0.10* 0.17*  CREATININE 1.14*  --   --   --   --  1.09*  --   --  1.15*  --   --    < > = values in this interval not displayed.    Estimated Creatinine Clearance: 44.7 mL/min (A) (by C-G formula based on SCr of 1.15 mg/dL (H)).   Medications: Infusions:  . sodium chloride Stopped (12/27/18 0340)  . feeding supplement (VITAL HIGH PROTEIN) 1,000 mL (12/30/18 1744)  . heparin 800 Units/hr (12/30/18 1323)  . HYDROmorphone 5 mg/hr (12/30/18 2100)  . meropenem (MERREM) IV 1 g (12/30/18 2105)  . midazolam 6 mg/hr (12/30/18 2159)  . norepinephrine (LEVOPHED) Adult infusion 7 mcg/min (12/30/18 2100)  . vecuronium (NORCURON) infusion Stopped (12/29/18 1000)    Assessment: 16 YOF diagnosed with COVID. She  developed Afib during her ICU stay with CHADSVASc score of 7 (Age, female gender, HTN, stroke, CHF). She was on IV heparin for Afib until 7/9 when she experienced bloody secretions for which heparin was stopped and then resumed on 7/10. 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 Pharmacy consulted to stop IV heparin and start [place].  On 7/12 confirmed UE DVT. On 7/13 Bivalirudin was held d/t profuse bleeding from tear in trachea identified during bronch.  On 7/14, bleeding has resolved, OK to resume Bivalirudin, then transitioned back to Heparin when HIT Ab negative.  Heparin level up slightly to 0.17 after small bolus and increase in rate earlier today. Hgb low but stable at 10, plts 112  Goal of Therapy:  Heparin level 0.3-0.5 units/mL Monitor platelets by anticoagulation protocol: Yes   Plan:  Give heparin 2000 units bolus IV x 1 Increase to heparin IV infusion at 950  units/hr Monitor daily heparin level, CBC, s/s of bleed  Elenor Quinones, PharmD, BCPS, BCIDP Clinical Pharmacist 12/30/2018 10:30 PM

## 2018-12-30 NOTE — Progress Notes (Signed)
Family facetimed with pt.

## 2018-12-31 ENCOUNTER — Inpatient Hospital Stay (HOSPITAL_COMMUNITY): Payer: Medicare HMO

## 2018-12-31 DIAGNOSIS — J1289 Other viral pneumonia: Secondary | ICD-10-CM

## 2018-12-31 LAB — COMPREHENSIVE METABOLIC PANEL
ALT: 363 U/L — ABNORMAL HIGH (ref 0–44)
AST: 351 U/L — ABNORMAL HIGH (ref 15–41)
Albumin: 2.2 g/dL — ABNORMAL LOW (ref 3.5–5.0)
Alkaline Phosphatase: 294 U/L — ABNORMAL HIGH (ref 38–126)
Anion gap: 12 (ref 5–15)
BUN: 82 mg/dL — ABNORMAL HIGH (ref 8–23)
CO2: 25 mmol/L (ref 22–32)
Calcium: 8.3 mg/dL — ABNORMAL LOW (ref 8.9–10.3)
Chloride: 102 mmol/L (ref 98–111)
Creatinine, Ser: 1.53 mg/dL — ABNORMAL HIGH (ref 0.44–1.00)
GFR calc Af Amer: 38 mL/min — ABNORMAL LOW (ref >60)
GFR calc non Af Amer: 33 mL/min — ABNORMAL LOW (ref >60)
Glucose, Bld: 305 mg/dL — ABNORMAL HIGH (ref 70–99)
Potassium: 5.4 mmol/L — ABNORMAL HIGH (ref 3.5–5.1)
Sodium: 139 mmol/L (ref 135–145)
Total Bilirubin: 0.5 mg/dL (ref 0.3–1.2)
Total Protein: 5.3 g/dL — ABNORMAL LOW (ref 6.5–8.1)

## 2018-12-31 LAB — CBC
HCT: 33.4 % — ABNORMAL LOW (ref 36.0–46.0)
Hemoglobin: 9.9 g/dL — ABNORMAL LOW (ref 12.0–15.0)
MCH: 31.4 pg (ref 26.0–34.0)
MCHC: 29.6 g/dL — ABNORMAL LOW (ref 30.0–36.0)
MCV: 106 fL — ABNORMAL HIGH (ref 80.0–100.0)
Platelets: 167 10*3/uL (ref 150–400)
RBC: 3.15 MIL/uL — ABNORMAL LOW (ref 3.87–5.11)
RDW: 17.6 % — ABNORMAL HIGH (ref 11.5–15.5)
WBC: 15.5 10*3/uL — ABNORMAL HIGH (ref 4.0–10.5)
nRBC: 1 % — ABNORMAL HIGH (ref 0.0–0.2)

## 2018-12-31 LAB — GLUCOSE, CAPILLARY
Glucose-Capillary: 309 mg/dL — ABNORMAL HIGH (ref 70–99)
Glucose-Capillary: 289 mg/dL — ABNORMAL HIGH (ref 70–99)
Glucose-Capillary: 304 mg/dL — ABNORMAL HIGH (ref 70–99)
Glucose-Capillary: 247 mg/dL — ABNORMAL HIGH (ref 70–99)
Glucose-Capillary: 183 mg/dL — ABNORMAL HIGH (ref 70–99)

## 2018-12-31 LAB — HEPARIN LEVEL (UNFRACTIONATED)
Heparin Unfractionated: 0.57 IU/mL (ref 0.30–0.70)
Heparin Unfractionated: 0.46 IU/mL (ref 0.30–0.70)

## 2018-12-31 LAB — PROCALCITONIN: Procalcitonin: 0.65 ng/mL

## 2018-12-31 MED ORDER — FUROSEMIDE 10 MG/ML IJ SOLN
40.0000 mg | Freq: Once | INTRAMUSCULAR | Status: AC
  Administered 2018-12-31: 12:00:00 40 mg via INTRAVENOUS
  Filled 2018-12-31: qty 4

## 2018-12-31 MED ORDER — SODIUM ZIRCONIUM CYCLOSILICATE 10 G PO PACK
10.0000 g | PACK | Freq: Once | ORAL | Status: AC
  Administered 2018-12-31: 10 g via ORAL
  Filled 2018-12-31: qty 1

## 2018-12-31 MED ORDER — PHENYLEPHRINE HCL-NACL 10-0.9 MG/250ML-% IV SOLN
0.0000 ug/min | INTRAVENOUS | Status: DC
  Administered 2018-12-31: 20 ug/min via INTRAVENOUS
  Filled 2018-12-31: qty 250

## 2018-12-31 MED ORDER — SODIUM ZIRCONIUM CYCLOSILICATE 10 G PO PACK
10.0000 g | PACK | Freq: Three times a day (TID) | ORAL | Status: DC
  Administered 2018-12-31 (×2): 10 g via ORAL
  Filled 2018-12-31 (×3): qty 1

## 2018-12-31 MED ORDER — VECURONIUM BROMIDE 10 MG IV SOLR
10.0000 mg | Freq: Once | INTRAVENOUS | Status: AC
  Administered 2018-12-31: 10 mg via INTRAVENOUS
  Filled 2018-12-31: qty 10

## 2018-12-31 NOTE — Progress Notes (Signed)
Pt daughter, Margaretha Sheffield was called and updated about pt condition, including increasing pressor needs, temperature of 102.2,  and minimal urinary output. Daughter still seems hopeful about pt's full recovery. All questions answered. Margaretha Sheffield expressed gratitude for all the care of her mother.

## 2018-12-31 NOTE — Progress Notes (Signed)
Called [facility] and spoke with Dr. Jimmy Footman and made her aware of heart rate in 130-140's afib which according to report heart rate has been elevated for several days in afib.  Made MD aware that levophed is having to be titrated up to 25 mcg/min.  MD stated she would order neo drip and try to transition patient to neo drip and off levophed drip.

## 2018-12-31 NOTE — Progress Notes (Signed)
Pharmacy Antibiotic Note  Jody Taylor is a 78 y.o. female admitted on 12/15/2018.  Pharmacy has been consulted for Meropenem dosing for sepsis/ HCAP.  Plan: Meropenem 1g IV q12h x 7 days. Follow up renal function, culture results, and clinical course.   Height: 4\' 10"  (147.3 cm) Weight: 244 lb 0.8 oz (110.7 kg) IBW/kg (Calculated) : 40.9  Temp (24hrs), Avg:101 F (38.3 C), Min:99 F (37.2 C), Max:103.1 F (39.5 C)  Recent Labs  Lab 12/27/18 0205 12/28/18 0101 12/28/18 1545 12/29/18 0445 12/30/18 0130 12/31/18 0500  WBC 16.1* 33.1* 26.6* 20.4* 16.3* 15.5*  CREATININE 0.89 1.14*  --  1.09* 1.15* 1.53*    Estimated Creatinine Clearance: 34 mL/min (A) (by C-G formula based on SCr of 1.53 mg/dL (H)).    Allergies  Allergen Reactions  . Ace Inhibitors Other (See Comments) and Cough    CHEST PAIN  . Other     Patient reports receiving blood after a miscarriage "years ago". She states she broke out from receiving this blood. Has not received any since  . Codeine Nausea And Vomiting  . Levaquin [Levofloxacin] Nausea And Vomiting    Antimicrobials this admission: 6/29 Remdesivir >>7/3 6/29 Actemra 6/30 cPlasma 7/5 Linezolid >>7/6 7/5 Cefepime >> 7/7 7/12 Vanc >> 7/14 7/12 Meropenem >> (7/18)  Microbiology results: 6/29 MRSA PCR: negative 7/5 TA: Mod GPCs in clusters - normal flora  7/5 BCx: NGF 7/5 UCx: 100k MRSE 7/12 TA: no org seen, few normal flora.  Thank you for allowing pharmacy to be a part of this patient's care.  Gretta Arab PharmD, BCPS Clinical pharmacist phone 7am- 5pm: [phone] 12/31/2018 9:19 AM

## 2018-12-31 NOTE — Progress Notes (Signed)
Spoke with patient's daughter Margaretha Sheffield on the phone and updated her on patient's status and care. All questions answered prior to daughter ending call.

## 2018-12-31 NOTE — Progress Notes (Signed)
NAME:  Jody Taylor, MRN:  [id number], DOB:  [birth year], LOS: 31 ADMISSION DATE:  12/06/2018, CONSULTATION DATE:  6/29 REFERRING MD:  Sloan Leiter, CHIEF COMPLAINT:  Dyspnea   Brief History   78 y/o female with a history of diastolic heart failure admitted on June 29 for ARDS in setting of COVID 19 pneumonia.  Required intubation at [facility] emergency room.  Past Medical History  Diastolic heart failure History of stroke (mini stroke) Obstructive sleep apnea on CPAP Hypertension Hyperlipidemia Coronary artery disease, had PCI in 4967 Chronic diastolic heart failure Chronic kidney disease GERD Allergic rhinitis  Significant Hospital Events   June 29 admission July 2 severe vent dyssynchrony July 3 oxygenation worsening overnight, tvol 12cc/kg IB on SIMV, changed to [facility], paralytic used July 4 no acute events July 5 paralytic used July 6 no acute changed, fever, sedation minimized July 7 Afib July 8 severe dyssyncrhony, large tidal volumes, diaphoresis, fentanyl drip added back, low dose versed, changed back to [facility] July 9 more comfortable, stable TVol, severe ventilator dyssynchrony lead increased intrathoracic pressure, bradycardia requiring atropine emergently.  Change sedation from fentanyl to Dilaudid, improved in a later synchrony July 10 stable, weaning vent, diuresing July 12 increased FiO2 overnight, hypotensive started on levophed, thrombocytopenic started on argatroban July 13 increased work of breathing, hemoptysis on rounds, afib with RVR on rounds emergent bronc showed laceration just distal to the endotracheal tube in the trach July 14: July 13 emergent bronch for hemoptysis, laceration noted trachea. Remains on vasopressors. Remains heavily sedated, on neuromuscular blockade July 15: 2020: 80% oxygen with a PEEP of 12.  On Levophed 4 mcg.  Consults:  PCCM  Procedures:  June 29 endotracheal tube> June 29 right internal jugular central venous line>June 29  June 30 PICC >   Significant Diagnostic Tests:  7/6 CT head > hold infarcts 7/7 LE Doppler negative for DVT July 13 bronchoscopy small laceration just distal to the endotracheal tube tip in trachea July 13 emergent bronch for hemoptysis, laceration noted trachea   Micro Data:  June 26 SARS-COV-2 Positivie July 5 Urine > staph epidermidis July 12 resp > neg  Antimicrobials:  June 29 remdesivir June29Actemra June 29 solumedrol - decadron 7/1 >> June 29 convalescent plasma  ............................. 7/5  zyvox > 7/6 7/5 - cefepime > 7.6  7/12 cefepime>  7/12 vanc > 7/14  Interim history/subjective:   7/16 - still febrile, 70% fio2, pee 12, on levophed 58mcg, versed 2, dilaudid 3. Easily vagals and too risky to prone per RN/RT. Makes urine. But AKI onset + with rising K. Also on heparin gtt  Objective   Blood pressure 113/73, pulse (!) 123, temperature 99 F (37.2 C), temperature source Axillary, resp. rate (!) 24, height 4\' 10"  (1.473 m), weight 110.7 kg, SpO2 95 %.    Vent Mode: PRVC FiO2 (%):  [70 %-80 %] 70 % Set Rate:  [35 bmp] 35 bmp Vt Set:  [320 mL] 320 mL PEEP:  [12 cmH20] 12 cmH20 Plateau Pressure:  [27 cmH20-30 cmH20] 29 cmH20   Intake/Output Summary (Last 24 hours) at 12/31/2018 1026 Last data filed at 12/31/2018 0900 Gross per 24 hour  Intake 2388 ml  Output 975 ml  Net 1413 ml   Filed Weights   12/29/18 0449 12/30/18 0500 12/31/18 0500  Weight: 104.3 kg 108.7 kg 110.7 kg    General Appearance:  Looks criticall ill OBESE - + Head:  Normocephalic, without obvious abnormality, atraumatic Eyes:  PERRL - yes, conjunctiva/corneas -  muddy     Ears:  Normal external ear canals, both ears Nose:  G tube - no Throat:  ETT TUBE - yes , OG tube - yes Neck:  Supple,  No enlargement/tenderness/nodules Lungs: Clear to auscultation bilaterally, Ventilator   Synchrony - yes Heart:  S1 and S2 normal, no murmur, CVP - no.  Pressors - yes Abdomen:  Soft, no  masses, no organomegaly Genitalia / Rectal:  Not done Extremities:  Extremities- intact Skin:  ntact in exposed areas . Sacral area - not examiend Neurologic:  Sedation - diladi and vesed -> RASS - -4     LABS    PULMONARY Recent Labs  Lab 12/24/18 1150 12/28/18 1321  PHART 7.394 7.154*  PCO2ART 45.0 78.6*  PO2ART 64.0* 91.0  HCO3 27.5 27.3  TCO2 29 30  O2SAT 92.0 93.0    CBC Recent Labs  Lab 12/29/18 0445 12/30/18 0130 12/31/18 0500  HGB 9.9* 10.0* 9.9*  HCT 32.3* 32.9* 33.4*  WBC 20.4* 16.3* 15.5*  PLT 110* 112* 167    COAGULATION No results for input(s): INR in the last 168 hours.  CARDIAC  No results for input(s): TROPONINI in the last 168 hours. No results for input(s): PROBNP in the last 168 hours.   CHEMISTRY Recent Labs  Lab 12/27/18 0205 12/27/18 0500 12/28/18 0101 12/28/18 1321 12/29/18 0445 12/30/18 0130 12/31/18 0500  NA 137  --  137 133* 134* 137 139  K 3.8  --  4.8 4.9 4.6 5.4* 5.4*  CL 101  --  102  --  99 104 102  CO2 27  --  23  --  25 26 25   GLUCOSE 116*  --  188*  --  191* 180* 305*  BUN 62*  --  68*  --  63* 66* 82*  CREATININE 0.89  --  1.14*  --  1.09* 1.15* 1.53*  CALCIUM 7.5*  --  8.2*  --  7.8* 8.4* 8.3*  MG  --  2.2  --   --   --   --   --    Estimated Creatinine Clearance: 34 mL/min (A) (by C-G formula based on SCr of 1.53 mg/dL (H)).   LIVER Recent Labs  Lab 12/31/18 0500  AST 351*  ALT 363*  ALKPHOS 294*  BILITOT 0.5  PROT 5.3*  ALBUMIN 2.2*     INFECTIOUS Recent Labs  Lab 12/28/18 0101 12/29/18 0445 12/31/18 0500  PROCALCITON 0.61 0.31 0.65     ENDOCRINE CBG (last 3)  Recent Labs    12/30/18 2309 12/31/18 0335 12/31/18 0806  GLUCAP 263* 309* 289*         IMAGING x48h  - image(s) personally visualized  -   highlighted in bold Dg Chest Port 1 View  Result Date: 12/31/2018 CLINICAL DATA:  Check endotracheal tube placement EXAM: PORTABLE CHEST 1 VIEW COMPARISON:  12/30/2018 FINDINGS:  Cardiac shadow is stable. Endotracheal tube, feeding catheter and left jugular central line are again seen and stable. Diffuse bilateral infiltrates are again identified and mildly improved from the prior study. No bony abnormality is seen. IMPRESSION: Slight improvement in bilateral infiltrates. Tubes and lines are stable. Electronically Signed   By: Inez Catalina M.D.   On: 12/31/2018 08:58   Dg Chest Port 1 View  Result Date: 12/30/2018 CLINICAL DATA:  COVID-19 pneumonia. EXAM: PORTABLE CHEST 1 VIEW COMPARISON:  12/27/2018 FINDINGS: The endotracheal tube is in good position, 4 cm above the carina. The left IJ central venous catheter is  stable with its tip at the brachiocephalic SVC junction. The right PICC line is been removed. Feeding tube coursing down the esophagus and into the stomach. The cardiac silhouette, mediastinal and hilar contours are stable. Improved lung aeration with decreasing airspace opacities. No pleural effusions. No pneumothorax. IMPRESSION: 1. Support apparatus in good position without complicating features. The right PICC line has been removed since the prior chest x-ray. 2. Improved lung aeration with clearing infiltrates. Electronically Signed   By: Marijo Sanes M.D.   On: 12/30/2018 09:07         Resolved Hospital Problem list   Bradycardia: improved with better vent synchrony AKI : resolved  Assessment & Plan:  ARDS due to COVID 19 pneumonia: minimal improvement over last 15 days Severe ventilator dyssynchrony leading to air trapping a vagal response Hemoptysis 7/13 > laceration in trachea, improved 7/14    12/31/2018 - > does not meet criteria for SBT/Extubation in setting of Acute Respiratory Failure due to severe ARDS    Plan  Continue mechanical ventilation per ARDS protocol ARDS Measure  - Target TVol 6-8cc/kgIBW  - Target Plateau Pressure < 30cm H20  - Target driving pressure less than 15 cm of water  - Target PaO2 55-65: titrate PEEP/FiO2 per  protocol  - As long as PaO2 to FiO2 ratio is less than 1:150 position in prone position for 16 hours a day  Ventilator associated pneumonia prevention protocol  Try to stop neuromuscular blockade, monitor closely for ventilator dyssynchrony as these episodes have been severe in the last week  Circulatory shock - continue levophed  AKI with mild hyperkalemia  - Rx lasix and reassess -  - lokelma x 1  New Right upper extremity DVT - 12/27/2018 Heparin infusion  Atrial fib with RVR: resolved Tele Monitor Heparin infusion  Shock 7/12, with encephalopathy, rising WBC count: HCAP? COVID? Continue meropenem 5 days  ICU delirium: For now continue sedation as needed for ventilator synchrony  Diastolic heart failure with volue overload - though on pressors Rx lasix x 1   Best practice:  Diet: tube feeding Pain/Anxiety/Delirium protocol (if indicated): RASS goal -2 minimize sedation VAP protocol (if indicated): Yes DVT prophylaxis: heparin infusion GI prophylaxis: yes Pantoprazole for stress ulcer prophylaxis Glucose control: SSI Mobility: bed rest Code Status: DNR  Family Communication: per TRH - Disposition: remain in ICU  Global - very poor prognsosis. Will not survive esp if ATN developes    ATTESTATION & SIGNATURE   The patient Jody Taylor is critically ill with multiple organ systems failure and requires high complexity decision making for assessment and support, frequent evaluation and titration of therapies, application of advanced monitoring technologies and extensive interpretation of multiple databases.   Critical Care Time devoted to patient care services described in this note is  30  Minutes. This time reflects time of care of this signee Dr Brand Males. This critical care time does not reflect procedure time, or teaching time or supervisory time of PA/NP/Med student/Med Resident etc but could involve care discussion time     Dr. Brand Males,  M.D., [facility].C.P Pulmonary and Critical Care Medicine Staff Physician [place] [facility] Pager: [phone], If no answer or between  15:00h - 7:00h: call [phone]  12/31/2018 10:26 AM

## 2018-12-31 NOTE — Progress Notes (Addendum)
ANTICOAGULATION CONSULT NOTE - Follow Up Consult  Pharmacy Consult for Heparin Indication: atrial fibrillation, +DVT  Allergies  Allergen Reactions  . Ace Inhibitors Other (See Comments) and Cough    CHEST PAIN  . Other     Patient reports receiving blood after a miscarriage "years ago". She states she broke out from receiving this blood. Has not received any since  . Codeine Nausea And Vomiting  . Levaquin [Levofloxacin] Nausea And Vomiting    Patient Measurements: Height: 4\' 10"  (147.3 cm) Weight: 244 lb 0.8 oz (110.7 kg) IBW/kg (Calculated) : 40.9  Heparin dosing weight:   Vital Signs: Temp: 101.5 F (38.6 C) (07/16 0600) Temp Source: Oral (07/16 0600) BP: 113/73 (07/16 0800) Pulse Rate: 116 (07/16 0636)  Labs: Recent Labs    12/28/18 1023  12/29/18 0445 12/29/18 1435 12/29/18 1710  12/30/18 0130 12/30/18 1125 12/30/18 2120 12/31/18 0500 12/31/18 0700  HGB  --    < > 9.9*  --   --   --  10.0*  --   --  9.9*  --   HCT  --    < > 32.3*  --   --   --  32.9*  --   --  33.4*  --   PLT  --    < > 110*  --   --   --  112*  --   --  167  --   APTT 42*  --   --  51* 54*  --   --   --   --   --   --   HEPARINUNFRC  --   --   --   --   --    < > <0.10* <0.10* 0.17*  --  0.57  CREATININE  --   --  1.09*  --   --   --  1.15*  --   --  1.53*  --    < > = values in this interval not displayed.    Estimated Creatinine Clearance: 34 mL/min (A) (by C-G formula based on SCr of 1.53 mg/dL (H)).   Medications: Infusions:  . sodium chloride Stopped (12/27/18 0340)  . feeding supplement (VITAL HIGH PROTEIN) 1,000 mL (12/30/18 1744)  . heparin 950 Units/hr (12/31/18 0700)  . HYDROmorphone 3 mg/hr (12/31/18 0700)  . meropenem (MERREM) IV Stopped (12/30/18 2135)  . midazolam 2 mg/hr (12/31/18 0700)  . norepinephrine (LEVOPHED) Adult infusion 23 mcg/min (12/31/18 0700)  . vecuronium (NORCURON) infusion Stopped (12/29/18 1000)    Assessment: 48 YOF diagnosed with COVID. She  developed Afib during her ICU stay with CHADSVASc score of 7 (Age, female gender, HTN, stroke, CHF). She was on IV heparin for Afib until 7/9 when she experienced bloody secretions for which heparin was stopped and then resumed on 7/10. 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 Pharmacy consulted to stop IV heparin and start [facility].  On 7/12 confirmed UE DVT. On 7/13 Bivalirudin was held d/t profuse bleeding from tear in trachea identified during bronch.  On 7/14, bleeding has resolved, OK to resume Bivalirudin, then transitioned back to Heparin when HIT Ab negative.  Today, 12/31/2018:  HL 0.57, therapeutic on heparin at 950 units/hr.  CBC:  Hgb remains low/stable at 9.9, Plt increased to 167.  (Baseline Plt 205 on admission)    No bleeding or line issues.  No further trachea bleeding.    SCr increased to 1.53 with minimal  UOP.  Goal of Therapy:  aPTT 50-85 seconds Monitor platelets by anticoagulation protocol: Yes   Plan:   Continue heparin IV infusion at 950 units/hr  Confirmatory Heparin level in 8 hours   Daily heparin level and CBC  Continue to monitor for s/s bleeding.   Gretta Arab PharmD, BCPS Clinical pharmacist phone 7am- 5pm: [phone] 12/31/2018 8:47 AM    Addendum: Confirmatory HL 0.46 remains therapeutic on heparin at 950 units/hr. RN reports a small amount of bleeding from gums that is now resolved.  No other bleeding or complications. Plan:  Continue heparin IV infusion at 950 units/hr  Daily heparin level and CBC  Continue to monitor for s/s bleeding  Gretta Arab PharmD, BCPS Clinical pharmacist phone 7am- 5pm: [phone] 12/31/2018 4:31 PM

## 2018-12-31 NOTE — Progress Notes (Signed)
Updated daughter via telephone and assisted with FaceTime.

## 2018-12-31 NOTE — Progress Notes (Signed)
Updated daughter Jody Taylor. She is very appreciative of the care her mother has been receiving. Will set up a facetime with family this afternoon.

## 2018-12-31 NOTE — Progress Notes (Signed)
Patient continues to have abdominal breathing despite increase in sedation. Notified Ramaswamy, MD. Will give 1 dose of Vec.

## 2018-12-31 NOTE — Progress Notes (Signed)
Inpatient Diabetes Program Recommendations  AACE/ADA: New Consensus Statement on Inpatient Glycemic Control (2015)  Target Ranges:  Prepandial:   less than 140 mg/dL      Peak postprandial:   less than 180 mg/dL (1-2 hours)      Critically ill patients:  140 - 180 mg/dL   Lab Results  Component Value Date   GLUCAP 289 (H) 12/31/2018   HGBA1C 6.4 (H) 12/20/2018    Review of Glycemic Control Results for Jody Taylor, Jody Taylor (MRN [id number]) as of 12/31/2018 10:28  Ref. Range 12/30/2018 19:19 12/30/2018 23:09 12/31/2018 03:35 12/31/2018 08:06  Glucose-Capillary Latest Ref Range: 70 - 99 mg/dL 244 (H) 263 (H) 309 (H) 289 (H)   Inpatient Diabetes Program Recommendations:   Noted hyperglycemia. Consider Novolog 4 units q 4 hrs meal coverage (hold if tubefeeding stopped or held for any reason).  Thank you, Nani Gasser. Roxane Puerto, RN, MSN, CDE  Diabetes Coordinator Inpatient Glycemic Control Team Team Pager [phone] (8am-5pm) 12/31/2018 10:30 AM

## 2018-12-31 NOTE — Progress Notes (Signed)
Assisted with FaceTime with patient's daughter Margaretha Sheffield. Updated her on patient's status today. Family very appreciative of staff.

## 2018-12-31 NOTE — Progress Notes (Signed)
PROGRESS NOTE  Jody Taylor ULA:[id number] DOB: 09-15-1940 DOA: 12/05/2018  PCP: Nicoletta Dress, MD  Brief History/Interval Summary:   - Patient is a 78 y.o. female with PMHx of HTN, CAD, chronic diastolic heart failure, severe persistent asthma on benralizumab, hypothyroidism, CKD stage III-presented to [facility] with acute respiratory distress-was found to have acute hypoxic respiratory failure secondary to COVID-19 pneumonia and emergently intubated and subsequently transferred to [facility] course complicated by ventilator dyssynchrony requiring prn paralytics, atrial fibrillation-and concern for encephalopathy as she has been at times not responsive even when off sedation.  See below for further details  Subjective/Interval History:  Patient is intubated and sedated.  No further bleeding noted from the endotracheal tube.      Assessment/Plan:  Acute Hypoxic Resp. Failure due to Acute Covid 19 Viral Illness/ARDS  Vent Mode: PRVC FiO2 (%):  [70 %-80 %] 70 % Set Rate:  [35 bmp] 35 bmp Vt Set:  [320 mL] 320 mL PEEP:  [12 cmH20] 12 cmH20 Plateau Pressure:  [27 cmH20-30 cmH20] 29 cmH20     Component Value Date/Time   PHART 7.154 (LL) 12/28/2018 1321   PCO2ART 78.6 (HH) 12/28/2018 1321   PO2ART 91.0 12/28/2018 1321   HCO3 27.3 12/28/2018 1321   TCO2 30 12/28/2018 1321   ACIDBASEDEF 2.0 12/28/2018 1321   O2SAT 93.0 12/28/2018 1321    COVID-19 Labs  Recent Labs    12/29/18 0445 12/30/18 1647  DDIMER 7.66*  --   CRP  --  3.8*     Fever: Febrile 103.1 yesterday morning Oxygen requirements: Mechanical ventilation.   Antibiotics: On vancomycin and meropenem. Previously was on cefepime and linezolid for 3 days. Remdesivir: Completed course of Remdesivir Steroids: Completed course of steroids. Diuretics: Currently diuresis on hold due to hypotension Actemra: Received Actemra on 6/29 at [facility] Plasma: Received  convalescent plasma on 6/30 Vitamin C and Zinc: Continue DVT Prophylaxis: Continue IV heparin.    Patient remains intubated and sedated.  Pulmonology is following.  Her FiO2 requirements continues to improve, she had worsening leukocytosis, low-grade temperature, continue with broad-spectrum antibiotics, follow on tracheal aspirate.Patient was started on vancomycin and meropenem.  WBC has improved.  Calcitonin remains mildly elevated, follow on repeat tracheal aspirate, this x-ray showing improving infiltrate. -We will give Lasix 1 dose today.  Hemoptysis Patient was noted to have bleeding from the endotracheal tube 7/13.  Emergent bronchoscopy done by pulmonology which showed a possible laceration in the trachea.  Her Angiomax was held.  Mild drop in hemoglobin noted.  However she has not had any further episodes of bleeding.  Okay to resume Angiomax.  No further signs of bleeding, hemoglobin is stable  Atrial fibrillation with RVR While she was undergoing bronchoscopy patient had tachycardia.  She was noted to be in A. fib with RVR.   - TSH and free T4 have been checked previously and have been normal.   - She had echocardiogram done during this hospitalization which did not show any concerning findings.  Patient was on IV heparin but was stopped due to drop in platelet counts and concern for HIT.  Patient was switched over to Angiomax. - HIT is negative, she is back on heparin GTT.  Thrombocytopenia Patient had drop in platelet count.  There was concern for HIT.  HIT antibodies are normal.     Acute metabolic encephalopathy Encephalopathy is most likely due to acute illness.  CT head done on 7/6 did not show  any acute findings.  Continue to monitor  Acute DVT right upper extremity In the setting of PICC line.  A central line was placed 7/13.  PICC line to be removed.  Currently on heparin GTT.  Steroid-induced hyperglycemia in the setting of known prediabetes HbA1c 6.4.  CBGs are  reasonably well controlled.  She is on Levemir and SSI.  Monitor CBGs closely.    Hypotension Patient is requiring Levophed.     Hypernatremia Resolved with free water.  Cut back on free water.  Episodes of bradycardia She became bradycardic on 7/9 due to vagal stimulation while being suctioned.  Had to be given atropine.  Elevated d-dimer D-dimer has been improving.  7.66 today.  Peak of 15.77 on 7/11.  She does have a DVT in the right upper extremity.    Chronic kidney disease stage III Renal function remains close to baseline. Monitor urine output.  History of coronary artery disease Continue aspirin.  History of chronic diastolic CHF Patient had significant weight gain in the hospital.  This has improved with diuresis  History of severe persistent asthma No wheezing noted.  Patient was on benralizumab as outpatient.  History of dyslipidemia Continue statin.  History of hypothyroidism Continue levothyroxine.  TSH and free T4 were normal.  History of GERD Continue PPI  History of anxiety and depression Stable.  Positive urine cultures Urine culture grew staph epidermidis.  Blood cultures negative.  Patient's procalcitonin level was less than 0.1.  Patient was given cefepime and linezolid for about 3 days.  Due to clinical stability patient was not given any additional antibiotics at that time.  Nutrition Continue tube feedings.  Constipation Per nursing staff she has not had a bowel movement in many days.  Increased dose of laxatives.  Suppositories as needed.  Enema as needed.  Goals of care Patient has had multiple setbacks.  Seems to be stable currently.  Prognosis remains guarded.  We will continue to discuss with family.   DVT Prophylaxis: Angiomax PUD Prophylaxis: Protonix Code Status: DNR Family Communication: Discussed with son on a daily basis Disposition Plan: Remain in ICU  Anti-infectives (From admission, onward)   Start     Dose/Rate Route  Frequency Ordered Stop   12/29/18 0000  vancomycin (VANCOCIN) 1,500 mg in sodium chloride 0.9 % 500 mL IVPB  Status:  Discontinued     1,500 mg 250 mL/hr over 120 Minutes Intravenous Every 36 hours 12/27/18 1254 12/28/18 1639   12/29/18 0000  vancomycin (VANCOCIN) IVPB 1000 mg/200 mL premix  Status:  Discontinued     1,000 mg 200 mL/hr over 60 Minutes Intravenous Every 36 hours 12/28/18 1639 12/29/18 1708   12/28/18 2200  meropenem (MERREM) 1 g in sodium chloride 0.9 % 100 mL IVPB     1 g 200 mL/hr over 30 Minutes Intravenous Every 12 hours 12/28/18 1639 01/02/19 2359   12/27/18 1130  vancomycin (VANCOCIN) 2,000 mg in sodium chloride 0.9 % 500 mL IVPB     2,000 mg 250 mL/hr over 120 Minutes Intravenous  Once 12/27/18 1048 12/27/18 1430   12/27/18 1130  meropenem (MERREM) 1 g in sodium chloride 0.9 % 100 mL IVPB  Status:  Discontinued     1 g 200 mL/hr over 30 Minutes Intravenous Every 8 hours 12/27/18 1048 12/28/18 1639   12/20/18 1230  linezolid (ZYVOX) IVPB 600 mg  Status:  Discontinued     600 mg 300 mL/hr over 60 Minutes Intravenous Every 12 hours 12/20/18 1210 12/21/18 1227  12/20/18 1230  ceFEPIme (MAXIPIME) 2 g in sodium chloride 0.9 % 100 mL IVPB  Status:  Discontinued     2 g 200 mL/hr over 30 Minutes Intravenous Every 12 hours 12/20/18 1229 12/22/18 1113   12/15/18 2000  remdesivir 100 mg in sodium chloride 0.9 % 250 mL IVPB     100 mg 500 mL/hr over 30 Minutes Intravenous Every 24 hours 12/07/2018 1845 12/18/18 2053   12/06/2018 2000  remdesivir 200 mg in sodium chloride 0.9 % 250 mL IVPB     200 mg 500 mL/hr over 30 Minutes Intravenous Once 11/24/2018 1845 11/28/2018 2116     Medications:  Scheduled: . artificial tears  1 application Both Eyes A3F  . aspirin  81 mg Per Tube Daily  . chlorhexidine  15 mL Mouth/Throat BID  . Chlorhexidine Gluconate Cloth  6 each Topical Daily  . free water  200 mL Per Tube Q8H  . insulin aspart  0-15 Units Subcutaneous Q4H  . insulin detemir   20 Units Subcutaneous Daily  . levothyroxine  50 mcg Intravenous Daily  . mouth rinse  15 mL Mouth Rinse 10 times per day  . pantoprazole sodium  40 mg Per Tube Daily  . polyethylene glycol  17 g Per Tube BID  . senna  2 tablet Per Tube QHS  . sodium chloride flush  10-40 mL Intracatheter Q12H  . vitamin C  500 mg Per Tube Daily  . zinc sulfate  220 mg Per Tube Daily   Continuous: . sodium chloride Stopped (12/27/18 0340)  . feeding supplement (VITAL HIGH PROTEIN) Stopped (12/31/18 1230)  . heparin Stopped (12/31/18 1450)  . HYDROmorphone 3 mg/hr (12/31/18 1500)  . meropenem (MERREM) IV Stopped (12/31/18 1107)  . midazolam 2 mg/hr (12/31/18 1503)  . norepinephrine (LEVOPHED) Adult infusion 22 mcg/min (12/31/18 1500)  . vecuronium (NORCURON) infusion Stopped (12/29/18 1000)   TDD:[id number], bisacodyl, HYDROmorphone, ipratropium-albuterol, midazolam, [DISCONTINUED] ondansetron **OR** ondansetron (ZOFRAN) IV, sodium phosphate   Objective:  Vital Signs  Vitals:   12/31/18 1210 12/31/18 1300 12/31/18 1400 12/31/18 1500  BP: 113/65 99/71 (!) 119/58 (!) 119/57  Pulse: (!) 128 (!) 122 (!) 118 (!) 112  Resp: (!) 38 (!) 24 (!) 24 (!) 23  Temp:      TempSrc:      SpO2: 94% 96% 97% 97%  Weight:      Height:        Intake/Output Summary (Last 24 hours) at 12/31/2018 1605 Last data filed at 12/31/2018 1500 Gross per 24 hour  Intake 2388.48 ml  Output 835 ml  Net 1553.48 ml   Filed Weights   12/29/18 0449 12/30/18 0500 12/31/18 0500  Weight: 104.3 kg 108.7 kg 110.7 kg    Dated, intubated, in no apparent distress Symmetrical Chest wall movement, Good air movement bilaterally, CTAB RRR,No Gallops,Rubs or new Murmurs, No Parasternal Heave +ve B.Sounds, Abd Soft, No tenderness, No rebound - guarding or rigidity. No Cyanosis, Clubbing ,+2 edema, No new Rash or bruise     Lab Results:  Data Reviewed: I have personally reviewed following labs and imaging studies  CBC:  Recent Labs  Lab 12/28/18 0101 12/28/18 1321 12/28/18 1545 12/29/18 0445 12/30/18 0130 12/31/18 0500  WBC 33.1*  --  26.6* 20.4* 16.3* 15.5*  HGB 12.4 10.5* 10.7* 9.9* 10.0* 9.9*  HCT 38.7 31.0* 33.7* 32.3* 32.9* 33.4*  MCV 98.5  --  101.5* 101.3* 102.2* 106.0*  PLT 163  --  126* 110* 112* 167  Basic Metabolic Panel: Recent Labs  Lab 12/27/18 0205 12/27/18 0500 12/28/18 0101 12/28/18 1321 12/29/18 0445 12/30/18 0130 12/31/18 0500  NA 137  --  137 133* 134* 137 139  K 3.8  --  4.8 4.9 4.6 5.4* 5.4*  CL 101  --  102  --  99 104 102  CO2 27  --  23  --  25 26 25   GLUCOSE 116*  --  188*  --  191* 180* 305*  BUN 62*  --  68*  --  63* 66* 82*  CREATININE 0.89  --  1.14*  --  1.09* 1.15* 1.53*  CALCIUM 7.5*  --  8.2*  --  7.8* 8.4* 8.3*  MG  --  2.2  --   --   --   --   --     GFR: Estimated Creatinine Clearance: 34 mL/min (A) (by C-G formula based on SCr of 1.53 mg/dL (H)).  Liver Function Tests: Recent Labs  Lab 12/31/18 0500  AST 351*  ALT 363*  ALKPHOS 294*  BILITOT 0.5  PROT 5.3*  ALBUMIN 2.2*    CBG: Recent Labs  Lab 12/30/18 2309 12/31/18 0335 12/31/18 0806 12/31/18 1154 12/31/18 1540  GLUCAP 263* 309* 289* 304* 247*     Recent Results (from the past 240 hour(s))  Culture, respiratory (non-expectorated)     Status: None   Collection Time: 12/27/18 12:38 PM   Specimen: Tracheal Aspirate; Respiratory  Result Value Ref Range Status   Specimen Description   Final    TRACHEAL ASPIRATE Performed at [facility] [address] [place], [place] [address]    Special Requests   Final    NONE Performed at [facility], [place] [address], [place], [place] [address]    Gram Stain   Final    FEW WBC PRESENT,BOTH PMN AND MONONUCLEAR NO ORGANISMS SEEN    Culture   Final    FEW Consistent with normal respiratory flora. Performed at [facility], [place] [address], [place], [place] [address]    Report Status  12/29/2018 FINAL  Final      Radiology Studies: Dg Chest Port 1 View  Result Date: 12/31/2018 CLINICAL DATA:  Check endotracheal tube placement EXAM: PORTABLE CHEST 1 VIEW COMPARISON:  12/30/2018 FINDINGS: Cardiac shadow is stable. Endotracheal tube, feeding catheter and left jugular central line are again seen and stable. Diffuse bilateral infiltrates are again identified and mildly improved from the prior study. No bony abnormality is seen. IMPRESSION: Slight improvement in bilateral infiltrates. Tubes and lines are stable. Electronically Signed   By: Inez Catalina M.D.   On: 12/31/2018 08:58   Dg Chest Port 1 View  Result Date: 12/30/2018 CLINICAL DATA:  COVID-19 pneumonia. EXAM: PORTABLE CHEST 1 VIEW COMPARISON:  12/27/2018 FINDINGS: The endotracheal tube is in good position, 4 cm above the carina. The left IJ central venous catheter is stable with its tip at the brachiocephalic SVC junction. The right PICC line is been removed. Feeding tube coursing down the esophagus and into the stomach. The cardiac silhouette, mediastinal and hilar contours are stable. Improved lung aeration with decreasing airspace opacities. No pleural effusions. No pneumothorax. IMPRESSION: 1. Support apparatus in good position without complicating features. The right PICC line has been removed since the prior chest x-ray. 2. Improved lung aeration with clearing infiltrates. Electronically Signed   By: Marijo Sanes M.D.   On: 12/30/2018 09:07       LOS: 17 days  Phillips Climes MD  [facility] Pager on [email or web address]  12/31/2018, 4:05 PM

## 2018-12-31 NOTE — Progress Notes (Signed)
Patient vomiting. Tube feeds held. Chase Caller, MD notified.

## 2019-01-01 LAB — GLUCOSE, CAPILLARY: Glucose-Capillary: 138 mg/dL — ABNORMAL HIGH (ref 70–99)

## 2019-01-01 MED ORDER — VECURONIUM BROMIDE 10 MG IV SOLR: 10.0000 mg | INTRAVENOUS | Status: DC | PRN

## 2019-01-01 MED ORDER — PHENYLEPHRINE HCL-NACL 40-0.9 MG/250ML-% IV SOLN: 0.0000 ug/min | INTRAVENOUS | Status: DC

## 2019-01-16 NOTE — Progress Notes (Signed)
Paged Dr. Denton Brick via She[facility] Evans and made him aware of patient's death at 33.  Waiting on return call back from page.

## 2019-01-16 NOTE — Death Summary Note (Signed)
DEATH SUMMARY   Patient Details  Name: Jody Taylor MRN: [id number] DOB: [birth year]  Admission/Discharge Information   Admit Date:  12-19-18  Date of Death: Date of Death: 01/06/19  Time of Death: Time of Death: Sep 18, 2022  Length of Stay: 09-07-22  Referring Physician: Nicoletta Dress, MD   Reason(s) for Hospitalization   Acute Hypoxic Resp. Failure due to Acute Covid 19 Viral Illness/ARDS    Hemoptysis/tracheal laceration  Acute metabolic encephalopathy  Atrial fibrillation with RVR  Thrombocytopenia  Acute DVT right upper extremity  Steroid-induced hyperglycemia in the setting of known prediabetes   Hypotension    Hypernatremia  Episodes of bradycardia  Elevated d-dimer  Chronic kidney disease stage III  History of coronary artery disease  History of chronic diastolic CHF  History of severe persistent asthma  History of dyslipidemia  History of hypothyroidism  History of GERD  History of anxiety and depression  Positive urine cultures  Constipation    Diagnoses  Preliminary cause of death:  Secondary Diagnoses (including complications and co-morbidities):  Principal Problem:   Acute respiratory failure with hypoxemia ([place]) Active Problems:   Coronary artery disease involving native coronary artery of native heart with angina pectoris (HCC)   Depression   Chronic kidney disease, stage 3 (moderate) (HCC)   Chronic diastolic CHF (congestive heart failure) ([place])   Adult hypothyroidism   COVID-19   Brief Hospital Course (including significant findings, care, treatment, and services provided and events leading to death)  Jody Taylor is a 78 y.o. year old female with PMHx of HTN, CAD, chronic diastolic heart failure, severe persistent asthma on benralizumab, hypothyroidism, CKD stage III-presented to [facility] with acute respiratory distress-was found to have acute hypoxic respiratory failure secondary to COVID-19 pneumonia  and emergently intubated and subsequently transferred to [facility] course complicated by ventilator dyssynchrony requiring prnparalytics, atrial fibrillation-and concern for encephalopathy as she has been at times not responsive even when off sedation.  During hospital stay, she was noted to have hemoptysis, bronchoscopy significant for tracheal laceration, anticoagulation has been held, transiently she has been on Angiomax till HIT ruled out, she was resumed on heparin, patient work-up significant for ARDS, with high ventilatory support throughout entire hospital stay, she was on PEEP of 12, FiO2 of 70% , patient son was updated daily basis.  12-19-22 admission July 2 severe vent dyssynchrony July 3 oxygenation worsening overnight, tvol 12cc/kg IB on SIMV, changed to [facility], paralytic used July 4 no acute events July 5 paralytic used July 6 no acute changed, fever, sedation minimized July 7 Afib July 8 severe dyssyncrhony, large tidal volumes, diaphoresis, fentanyl drip added back, low dose versed, changed back to [facility] July 9 more comfortable, stable TVol, severe ventilator dyssynchrony lead increased intrathoracic pressure, bradycardia requiring atropine emergently.  Change sedation from fentanyl to Dilaudid, improved in a later synchrony July 10 stable, weaning vent, diuresing July 12 increased FiO2 overnight, hypotensive started on levophed, thrombocytopenic started on argatroban July 13 increased work of breathing, hemoptysis on rounds, afib with RVR on rounds emergent bronc showed laceration just distal to the endotracheal tube in the trach July 14: July 13 emergent bronch for hemoptysis, laceration noted trachea. Remains on vasopressors. Remains heavily sedated, on neuromuscular blockade July 15: 2020: 80% oxygen with a PEEP of 12.  On Levophed 4 mcg.   Pertinent Labs and Studies  Significant Diagnostic Studies Dg Abd 1 View  Result Date: Dec 19, 2018 CLINICAL DATA:   Orogastric tube placement. EXAM: ABDOMEN -  1 VIEW COMPARISON:  None FINDINGS: Orogastric tube tip is in the distal body of the stomach. No dilated bowel.  Previous lumbar fusion at L5-S1. IMPRESSION: OG tube tip is in the distal stomach. Electronically Signed   By: Lorriane Shire M.D.   On: 12/01/2018 20:47   Ct Head Wo Contrast  Result Date: 12/21/2018 CLINICAL DATA:  Unresponsive, altered level of consciousness unexplained, history of coronary artery disease post MI, asthma, CHF, hypertension, former smoker EXAM: CT HEAD WITHOUT CONTRAST TECHNIQUE: Contiguous axial images were obtained from the base of the skull through the vertex without intravenous contrast. Sagittal and coronal MPR images reconstructed from axial data set. COMPARISON:  None FINDINGS: Brain: Generalized atrophy. Cavum septum pellucidum and vergae. No midline shift or mass effect. Small vessel chronic ischemic changes of deep cerebral white matter. 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 Streak artifacts from skull base. No definite intracranial hemorrhage, mass lesion or evidence of acute infarction. No extra-axial fluid collections. Vascular: Atherosclerotic calcifications of internal carotid arteries at skull base Skull: Intact Sinuses/Orbits: Small amount of fluid dependently in RIGHT sphenoid sinus. Remaining sinuses clear. Orbits clear. Other: N/A IMPRESSION: Atrophy with small vessel chronic ischemic changes of deep cerebral white matter. 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 No acute intracranial abnormalities. Electronically Signed   By: Lavonia Dana M.D.   On: 12/21/2018 16:49   Dg Chest Port 1 View  Result Date: 12/31/2018 CLINICAL DATA:  Check endotracheal tube placement EXAM: PORTABLE CHEST 1 VIEW COMPARISON:  12/30/2018 FINDINGS: Cardiac shadow is stable. Endotracheal tube, feeding catheter and left jugular central line are again seen and stable. Diffuse bilateral infiltrates are again identified and mildly improved from the prior  study. No bony abnormality is seen. IMPRESSION: Slight improvement in bilateral infiltrates. Tubes and lines are stable. Electronically Signed   By: Inez Catalina M.D.   On: 12/31/2018 08:58   Dg Chest Port 1 View  Result Date: 12/30/2018 CLINICAL DATA:  COVID-19 pneumonia. EXAM: PORTABLE CHEST 1 VIEW COMPARISON:  12/27/2018 FINDINGS: The endotracheal tube is in good position, 4 cm above the carina. The left IJ central venous catheter is stable with its tip at the brachiocephalic SVC junction. The right PICC line is been removed. Feeding tube coursing down the esophagus and into the stomach. The cardiac silhouette, mediastinal and hilar contours are stable. Improved lung aeration with decreasing airspace opacities. No pleural effusions. No pneumothorax. IMPRESSION: 1. Support apparatus in good position without complicating features. The right PICC line has been removed since the prior chest x-ray. 2. Improved lung aeration with clearing infiltrates. Electronically Signed   By: Marijo Sanes M.D.   On: 12/30/2018 09:07   Dg Chest Port 1 View  Result Date: 12/27/2018 CLINICAL DATA:  Status post central line placement EXAM: PORTABLE CHEST 1 VIEW COMPARISON:  Film from earlier in the same day. FINDINGS: Endotracheal tube and feeding catheter are again identified and stable. New left jugular central line is noted with the catheter tip in the left innominate vein just short of the superior vena cava. No pneumothorax is noted. The right-sided PICC line is again noted within the right innominate vein just short of the SVC. Cardiac shadow is stable. Patchy infiltrates are again identified bilaterally. IMPRESSION: No change in patchy airspace disease. No pneumothorax following left jugular central line. The catheter tip is noted in the left innominate vein. Electronically Signed   By: Inez Catalina M.D.   On: 12/27/2018 19:51   Dg Chest Port 1 View  Result Date:  12/27/2018 CLINICAL DATA:  Acute respiratory failure  with hypoxemia. COVID-19 infection. On ventilator. EXAM: PORTABLE CHEST 1 VIEW COMPARISON:  12/26/2018 FINDINGS: Endotracheal tube, feeding tube, and right arm PICC line remain in satisfactory position. Heart size is within normal limits. Diffuse bilateral airspace disease is again seen, without significant change. No evidence of pneumothorax or pleural effusion. IMPRESSION: Diffuse bilateral airspace disease, without significant change. Electronically Signed   By: Marlaine Hind M.D.   On: 12/27/2018 11:32   Dg Chest Port 1 View  Result Date: 12/26/2018 CLINICAL DATA:  Pneumonia due to COVID-19 virus. EXAM: PORTABLE CHEST 1 VIEW COMPARISON:  12/24/2018 and older exams. FINDINGS: Patchy bilateral airspace lung opacities with more diffuse interstitial type opacities are without change from the prior study. No new lung abnormalities. No convincing pleural effusion.  No pneumothorax. Endotracheal tube, nasal/orogastric tube and right PICC are stable. IMPRESSION: 1. No change from the most recent prior exam. 2. Persistent bilateral lung opacities consistent with multifocal pneumonia. 3. Stable support apparatus. Electronically Signed   By: Lajean Manes M.D.   On: 12/26/2018 09:11   Dg Chest Port 1 View  Result Date: 12/24/2018 CLINICAL DATA:  Pneumonia due to COVID-19 virus. EXAM: PORTABLE CHEST 1 VIEW COMPARISON:  Radiograph of December 23, 2018. FINDINGS: The heart size and mediastinal contours are within normal limits. Endotracheal and feeding tubes are unchanged in position. Atherosclerosis of thoracic aorta is noted. No pneumothorax is noted. Stable bilateral lung opacities are noted most consistent with multifocal pneumonia. The visualized skeletal structures are unremarkable. IMPRESSION: Stable support apparatus. Stable bilateral lung opacities are noted most consistent with multifocal pneumonia. Electronically Signed   By: Marijo Conception M.D.   On: 12/24/2018 10:21   Dg Chest Port 1 View  Result Date:  12/23/2018 CLINICAL DATA:  Acute respiratory failure with hypoxemia. Endotracheal tube present. EXAM: PORTABLE CHEST 1 VIEW COMPARISON:  12/22/2018 and 12/20/2018 FINDINGS: Endotracheal tube tip is 3.9 cm above the carina. Feeding tube tip appears to be in the duodenal bulb. PICC tip is in the superior vena cava at just below the level of the carina. Diffuse hazy bilateral pulmonary infiltrates are essentially unchanged. No effusions. Heart size and vascularity are normal. No acute bone abnormality. IMPRESSION: 1. No change in the bilateral pulmonary infiltrates. 2. Endotracheal tube tip is 3.9 cm above the carina. Electronically Signed   By: Lorriane Shire M.D.   On: 12/23/2018 07:02   Dg Chest Port 1 View  Result Date: 12/22/2018 CLINICAL DATA:  Endotracheal tube. EXAM: PORTABLE CHEST 1 VIEW COMPARISON:  12/20/2018. FINDINGS: Endotracheal tube, feeding tube, right PICC line stable position. Heart size stable. Diffuse prominent bilateral pulmonary interstitial prominence again noted. Low lung volumes with progressive atelectasis/infiltrates both lung bases. No prominent pleural effusion. No pneumothorax. IMPRESSION: 1.  Lines and tubes in stable position. 2. Diffuse prominent bilateral pulmonary interstitial prominence again noted. Low lung volumes with progressive atelectasis/infiltrates both lung bases. Electronically Signed   By: Marcello Moores  Register   On: 12/22/2018 06:44   Dg Chest Port 1 View  Result Date: 12/18/2018 CLINICAL DATA:  Shock. EXAM: PORTABLE CHEST 1 VIEW COMPARISON:  Radiograph of same day. FINDINGS: Stable cardiomediastinal silhouette. Endotracheal and feeding tubes are in good position. Right-sided PICC line is unchanged in position. No pneumothorax or pleural effusion is noted. Stable diffuse bilateral lung opacities are noted consistent with pneumonia. Bony thorax unremarkable. IMPRESSION: Stable support apparatus. Stable bilateral lung opacities consistent with pneumonia. Aortic  Atherosclerosis (ICD10-I70.0). Electronically Signed  By: Marijo Conception M.D.   On: 12/18/2018 17:20   Dg Chest Port 1 View  Result Date: 12/18/2018 CLINICAL DATA:  Shortness of breath, pneumonia, COVID-19 virus EXAM: PORTABLE CHEST 1 VIEW COMPARISON:  12/15/2018 FINDINGS: Endotracheal tube 3.2 cm above the carina. Exam is rotated to the left. Right upper extremity PICC line tip mid SVC level. Stable cardiomegaly and diffuse mixed interstitial and airspace opacities throughout both lungs. Low lung volumes persist. No significant change in aeration. No developing large effusion or pneumothorax. Aorta atherosclerotic. IMPRESSION: Stable cardiomegaly and diffuse mixed interstitial and airspace process compatible with pneumonia/viral pneumonia. Electronically Signed   By: Jerilynn Mages.  Shick M.D.   On: 12/18/2018 11:12   Dg Chest Port 1 View  Result Date: 12/15/2018 CLINICAL DATA:  Pulmonary infiltrates. Endotracheally intubated. COVID-19. EXAM: PORTABLE CHEST 1 VIEW 10:25 a.m. COMPARISON:  12/15/2018 at 5:09 a.m. and 11/24/2018 FINDINGS: Endotracheal tube is in good position 4 cm above the carina. NG tube tip is below the diaphragm. Central line tip is in the superior vena cava just below the carina, unchanged. Extensive bilateral pulmonary infiltrates persist, slightly increased at the left base. No effusions.  No acute bone abnormality. IMPRESSION: Slight progression of pulmonary infiltrates at the left base. No other change. Electronically Signed   By: Lorriane Shire M.D.   On: 12/15/2018 11:15   Portable Chest 1 View  Result Date: 12/15/2018 CLINICAL DATA:  Shortness of breath.  COVID-19. EXAM: PORTABLE CHEST 1 VIEW COMPARISON:  12/04/2018. FINDINGS: Patient is rotated to the right. Endotracheal tube tip noted just above the right mainstem bronchus. Retraction of approximately 2 cm suggested. NG tube noted with tip below left hemidiaphragm. Right IJ line stable position. Heart size stable. Diffuse severe bilat  interstitial infiltrates are again noted. Persistent low lung volumes. No pleural effusion or pneumothorax. IMPRESSION: 1. Endotracheal tube tip is noted just above the right mainstem bronchus. Retraction of approximately 2 cm suggested. NG tube and right IJ line stable position. 2. Diffuse severe bilateral pulmonary interstitial infiltrates again noted. Persistent low lung volumes. Critical Value/emergent results were called by telephone at the time of interpretation on 12/15/2018 at 7:01 am to nurse Elmyra Ricks, who verbally acknowledged these results. Electronically Signed   By: Marcello Moores  Register   On: 12/15/2018 07:02   Dg Chest Port 1 View  Result Date: 11/27/2018 CLINICAL DATA:  Acute respiratory failure with hypoxia. EXAM: PORTABLE CHEST 1 VIEW COMPARISON:  December 14, 2018 FINDINGS: The endotracheal tube terminates above the carina by approximately 2.1 cm. The right-sided central venous catheter is well position. The enteric tube extends below the left hemidiaphragm. The heart size is enlarged. Aortic calcifications are noted. Again seen are diffuse bilateral hazy airspace opacities with some significant improvement from prior study. This may be in part due to approved imaging technique. There is no definite pneumothorax. There are likely small bilateral pleural effusions. IMPRESSION: 1. Lines and tubes as above. 2. Persistent but slightly improved multifocal airspace opacities which can be seen in patients with pulmonary edema or an atypical infectious process. Electronically Signed   By: Constance Holster M.D.   On: 12/04/2018 19:07   Dg Chest Port 1v Same Day  Result Date: 12/20/2018 CLINICAL DATA:  Dyspnea, COVID-19 positive EXAM: PORTABLE CHEST 1 VIEW COMPARISON:  12/18/2018 chest radiograph. FINDINGS: Right PICC terminates in upper third of the SVC. Endotracheal tube tip is 4.1 cm above the carina. Enteric tube enters stomach with the tip not seen on this image. Stable cardiomediastinal silhouette  with  normal heart size. No pneumothorax. No pleural effusion. Patchy hazy opacities throughout both lungs without appreciable change. IMPRESSION: 1. Well-positioned support structures. 2. Patchy hazy opacities throughout both lungs without appreciable change, compatible with multilobar pneumonia. Electronically Signed   By: Ilona Sorrel M.D.   On: 12/20/2018 10:15   Vas [place] Lower Extremity Venous (dvt)  Result Date: 12/21/2018  Lower Venous Study Indications: Swelling, and hypoxic-intubated and elevated d-dimer. and positive for Covid-19.  Comparison Study: No prior. Performing Technologist: Oda Cogan RDMS, RVT  Examination Guidelines: A complete evaluation includes B-mode imaging, spectral Doppler, color Doppler, and power Doppler as needed of all accessible portions of each vessel. Bilateral testing is considered an integral part of a complete examination. Limited examinations for reoccurring indications may be performed as noted.  +---------+---------------+---------+-----------+----------+-------+ RIGHT    CompressibilityPhasicitySpontaneityPropertiesSummary +---------+---------------+---------+-----------+----------+-------+ CFV      Full           Yes      Yes                          +---------+---------------+---------+-----------+----------+-------+ SFJ      Full                                                 +---------+---------------+---------+-----------+----------+-------+ FV Prox  Full                                                 +---------+---------------+---------+-----------+----------+-------+ FV Mid   Full                                                 +---------+---------------+---------+-----------+----------+-------+ FV DistalFull                                                 +---------+---------------+---------+-----------+----------+-------+ PFV      Full                                                  +---------+---------------+---------+-----------+----------+-------+ POP      Full           Yes      Yes                          +---------+---------------+---------+-----------+----------+-------+ PTV      Full                                                 +---------+---------------+---------+-----------+----------+-------+ PERO     Full                                                 +---------+---------------+---------+-----------+----------+-------+   +---------+---------------+---------+-----------+----------+-------+  LEFT     CompressibilityPhasicitySpontaneityPropertiesSummary +---------+---------------+---------+-----------+----------+-------+ CFV      Full           Yes      Yes                          +---------+---------------+---------+-----------+----------+-------+ SFJ      Full                                                 +---------+---------------+---------+-----------+----------+-------+ FV Prox  Full                                                 +---------+---------------+---------+-----------+----------+-------+ FV Mid   Full                                                 +---------+---------------+---------+-----------+----------+-------+ FV DistalFull                                                 +---------+---------------+---------+-----------+----------+-------+ PFV      Full                                                 +---------+---------------+---------+-----------+----------+-------+ POP      Full           Yes      Yes                          +---------+---------------+---------+-----------+----------+-------+ PTV      Full                                                 +---------+---------------+---------+-----------+----------+-------+ PERO     Full                                                 +---------+---------------+---------+-----------+----------+-------+     Summary:  Right: There is no evidence of deep vein thrombosis in the lower extremity. Left: There is no evidence of deep vein thrombosis in the lower extremity.  *See table(s) above for measurements and observations. Electronically signed by Ruta Hinds MD on 12/21/2018 at 8:37:01 PM.    Final    Vas [place] Upper Extremity Venous Duplex  Result Date: 12/28/2018 UPPER VENOUS STUDY  Indications: Edema, and PICC line. Covid positive. Ventilator Limitations: Ventilator, PICC line, bandages, edema, and body habitus. Comparison Study: No prior study on file for comparison. Performing Technologist: Sharion Dove RVS  Examination Guidelines: A complete evaluation includes B-mode imaging,  spectral Doppler, color Doppler, and power Doppler as needed of all accessible portions of each vessel. Bilateral testing is considered an integral part of a complete examination. Limited examinations for reoccurring indications may be performed as noted.  Right Findings: +----------+------------+---------+-----------+----------+--------------+ RIGHT     CompressiblePhasicitySpontaneousProperties   Summary     +----------+------------+---------+-----------+----------+--------------+ IJV           Full       Yes       Yes                             +----------+------------+---------+-----------+----------+--------------+ Subclavian               Yes       Yes                             +----------+------------+---------+-----------+----------+--------------+ Axillary      None       No        No                   Acute      +----------+------------+---------+-----------+----------+--------------+ Brachial                 No        No                   Acute      +----------+------------+---------+-----------+----------+--------------+ Radial                                              Not visualized +----------+------------+---------+-----------+----------+--------------+ Ulnar                                                Not visualized +----------+------------+---------+-----------+----------+--------------+ Cephalic      None                                      Acute      +----------+------------+---------+-----------+----------+--------------+ Basilic       Full                                                 +----------+------------+---------+-----------+----------+--------------+  Left Findings: +----------+------------+---------+-----------+----------+-------+ LEFT      CompressiblePhasicitySpontaneousPropertiesSummary +----------+------------+---------+-----------+----------+-------+ Subclavian               Yes       Yes                      +----------+------------+---------+-----------+----------+-------+  Summary:  Right: No evidence of deep vein thrombosis in the . However, unable to visualize the Radial, ulnar. No evidence of superficial vein thrombosis in the . However, unable to visualize the Radial, ulnar. No evidence of thrombosis in the . However, unable to visualize the Radial, ulnar. Findings consistent with acute deep vein thrombosis involving the right axillary vein and right brachial veins. Findings consistent with acute superficial vein thrombosis involving the  right cephalic vein. This was a limited study.  Left: No evidence of thrombosis in the subclavian.  *See table(s) above for measurements and observations.  Diagnosing physician: Monica Martinez MD Electronically signed by Monica Martinez MD on 12/28/2018 at 5:08:19 PM.    Final    [place] Ekg Site Rite  Result Date: 12/05/2018 If Site Rite image not attached, placement could not be confirmed due to current cardiac rhythm.   Microbiology Recent Results (from the past 240 hour(s))  Culture, respiratory (non-expectorated)     Status: None   Collection Time: 12/27/18 12:38 PM   Specimen: Tracheal Aspirate; Respiratory  Result Value Ref Range Status   Specimen Description   Final    TRACHEAL  ASPIRATE Performed at [facility] [address], [place], [place] [address]    Special Requests   Final    NONE Performed at [facility], [place] [address], [place], [place] [address]    Gram Stain   Final    FEW WBC PRESENT,BOTH PMN AND MONONUCLEAR NO ORGANISMS SEEN    Culture   Final    FEW Consistent with normal respiratory flora. Performed at [facility], [place] [address], [place], [place] [address]    Report Status 12/29/2018 FINAL  Final    Lab Basic Metabolic Panel: Recent Labs  Lab 12/27/18 0205 12/27/18 0500 12/28/18 0101 12/28/18 1321 12/29/18 0445 12/30/18 0130 12/31/18 0500  NA 137  --  137 133* 134* 137 139  K 3.8  --  4.8 4.9 4.6 5.4* 5.4*  CL 101  --  102  --  99 104 102  CO2 27  --  23  --  25 26 25   GLUCOSE 116*  --  188*  --  191* 180* 305*  BUN 62*  --  68*  --  63* 66* 82*  CREATININE 0.89  --  1.14*  --  1.09* 1.15* 1.53*  CALCIUM 7.5*  --  8.2*  --  7.8* 8.4* 8.3*  MG  --  2.2  --   --   --   --   --    Liver Function Tests: Recent Labs  Lab 12/31/18 0500  AST 351*  ALT 363*  ALKPHOS 294*  BILITOT 0.5  PROT 5.3*  ALBUMIN 2.2*   No results for input(s): LIPASE, AMYLASE in the last 168 hours. No results for input(s): AMMONIA in the last 168 hours. CBC: Recent Labs  Lab 12/28/18 0101 12/28/18 1321 12/28/18 1545 12/29/18 0445 12/30/18 0130 12/31/18 0500  WBC 33.1*  --  26.6* 20.4* 16.3* 15.5*  HGB 12.4 10.5* 10.7* 9.9* 10.0* 9.9*  HCT 38.7 31.0* 33.7* 32.3* 32.9* 33.4*  MCV 98.5  --  101.5* 101.3* 102.2* 106.0*  PLT 163  --  126* 110* 112* 167   Cardiac Enzymes: No results for input(s): CKTOTAL, CKMB, CKMBINDEX, TROPONINI in the last 168 hours. Sepsis Labs: Recent Labs  Lab 12/28/18 0101 12/28/18 1545 12/29/18 0445 12/30/18 0130 12/31/18 0500  PROCALCITON 0.61  --  0.31  --  0.65  WBC 33.1* 26.6* 20.4* 16.3* 15.5*    Procedures/Operations   June 29 endotracheal tube> June  29 right internal jugular central venous line>June 29 June 30 PICC >  7/6 CT head > hold infarcts 7/7 LE Doppler negative for DVT July 13 bronchoscopy small laceration just distal to the endotracheal tube tip in trachea     22-Jan-2019, 6:12 PM

## 2019-01-16 NOTE — Progress Notes (Signed)
Called and spoke with Jody Taylor and informed her that staff could not find glasses nor cell phone.  Daughter stated that a staff member had told her the items were here days ago when they picked up the patient's medicine. RN instructed her that the nursing supervisor would be contacted and made aware so that the items could be investigated further. Jody Taylor stated she would wait for a call tomorrow and if she didn't hear back she would call.

## 2019-01-16 NOTE — Progress Notes (Signed)
Called Jody Taylor and spoke with Meredith Staggers, Therapist, sports.  Dr. Jimmy Footman on the phone with someone else.  Asked [place] RN to speak with MD about patient have vent dysynchrony and asked about vecuronium push since versed bolus did not help. [facility] RN stated she would relay message to MD.

## 2019-01-16 NOTE — Progress Notes (Signed)
Called Jody Taylor and spoke with Elta Guadeloupe, RN and discussed that patient passed away at 0029.  Elta Guadeloupe, RN verbalized that he would inform Dr. Jimmy Footman of patient's death and that he would call [facility] and make them aware.

## 2019-01-16 NOTE — Progress Notes (Signed)
[facility] referral @0040 .   Referral #    [id number]  Representative ,  Guerry Bruin.  Pt not suitable for donation.

## 2019-01-16 NOTE — Progress Notes (Signed)
When speaking with daughter at 62 daughter stated that there is a cell phone and glasses that belong to patient here at [facility].  Room searched by this RN, Jarrett Soho, RN and Sara-beth, RN and no cell phone nor glasses found. Only a bag with clothing and shoes found.  Will notify daughter.

## 2019-01-16 NOTE — Progress Notes (Signed)
20 ml of Versed & 40 ml of Dilaudid wasted with Neila Gear, RN.

## 2019-01-16 NOTE — Progress Notes (Signed)
Patient belongings went down to morgue in bag with patient.

## 2019-01-16 NOTE — Progress Notes (Signed)
Called patient's daughter Jody Taylor and informed her that patient passed away at 0029.  Spoke with daughter for approximately 5 minutes and offered emotional support.  Jody Taylor expressed thanks to the nursing staff for all of her mother's care and stated she would call her brother to let him know and go tell her father in person.

## 2019-01-16 NOTE — Progress Notes (Signed)
Bradycardic on cardiac monitor, unable to palpate pulse.  Unable to doppler pulse. Asystole per cardiac monitor.  Adella Hare, RN and Jerelene Redden, RN auscultated for heart tones and none heard.  Time of death pronounced at 50. Kim, RRT in room as well.

## 2019-01-16 NOTE — Progress Notes (Signed)
RT NOTE:  RT @ bedside @ TOD 0029. Vent turned off by RT. ETT to remain in place @ this time.

## 2019-01-16 DEATH — deceased

## 2019-02-16 ENCOUNTER — Ambulatory Visit: Payer: Self-pay | Admitting: Allergy

## 2019-02-28 IMAGING — CR DG LUMBAR SPINE 1V
1 series · 1 of 1 positions shown · non-contrast
Comparison: Lumbar spine radiograph 09/24/2016

CLINICAL DATA: Localization for posterior lumbar interbody fusion

EXAM:
LUMBAR SPINE - 1 VIEW

[xtable lateral]
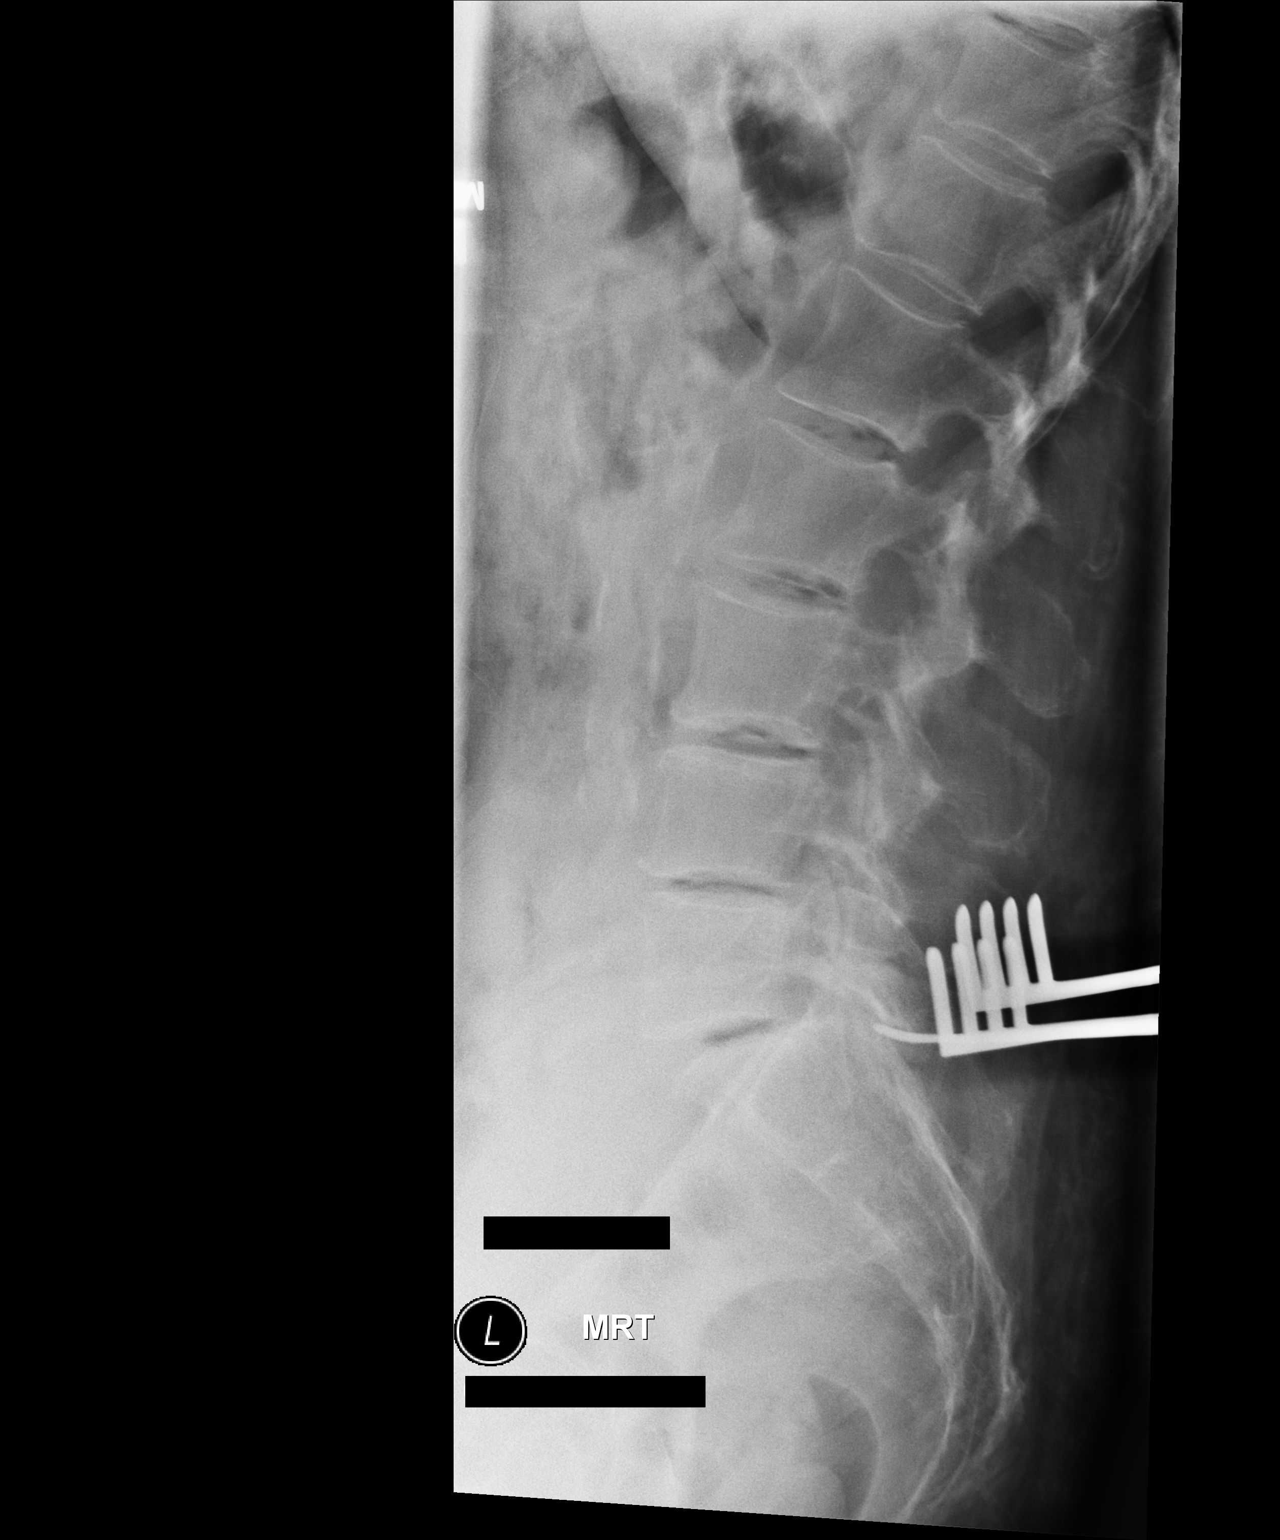

[1 of 1 positions shown; findings below may reference images not displayed]

FINDINGS: The instrument tip is at the L5-S1 level.
IMPRESSION: Intraoperative localization of L5-S1.

## 2019-02-28 IMAGING — RF DG LUMBAR SPINE 2-3V
1 series · 4 of 4 positions shown · non-contrast
Comparison: Previous examinations, including the upper a radiograph
obtained earlier today.

CLINICAL DATA: L5-S1 fusion.

EXAM:
LUMBAR SPINE - 2-3 VIEW; DG C-ARM 61-120 MIN

[Series 1: run · 4 of 4 slices shown]
[im 1/4]
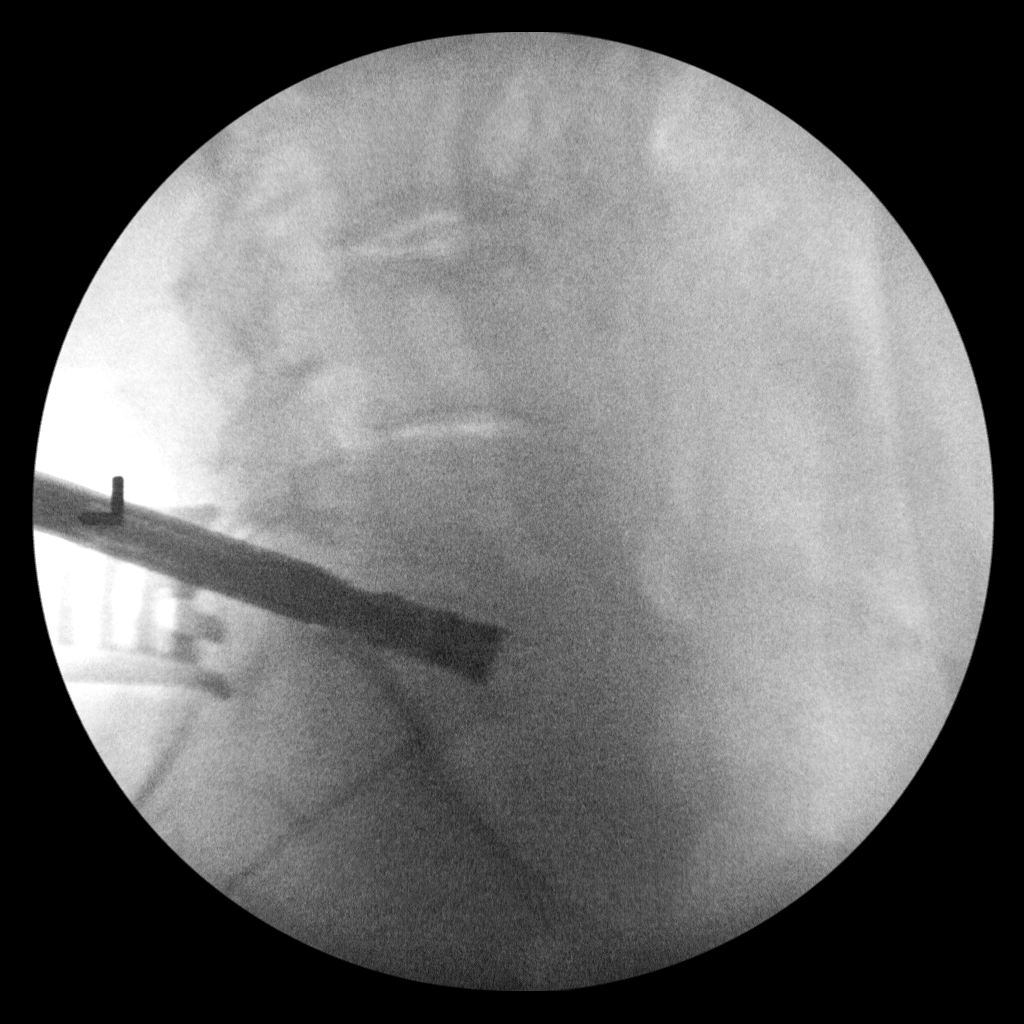
[im 2/4]
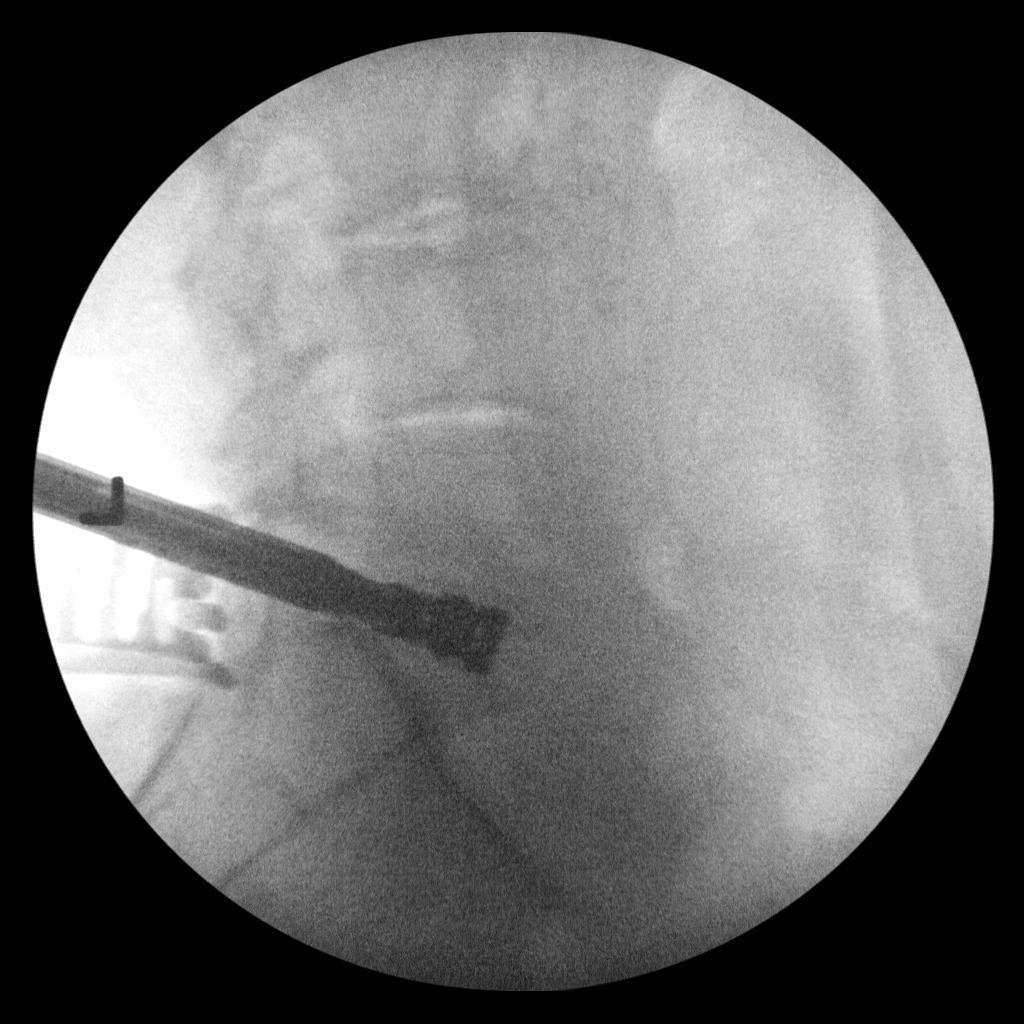
[im 3/4]
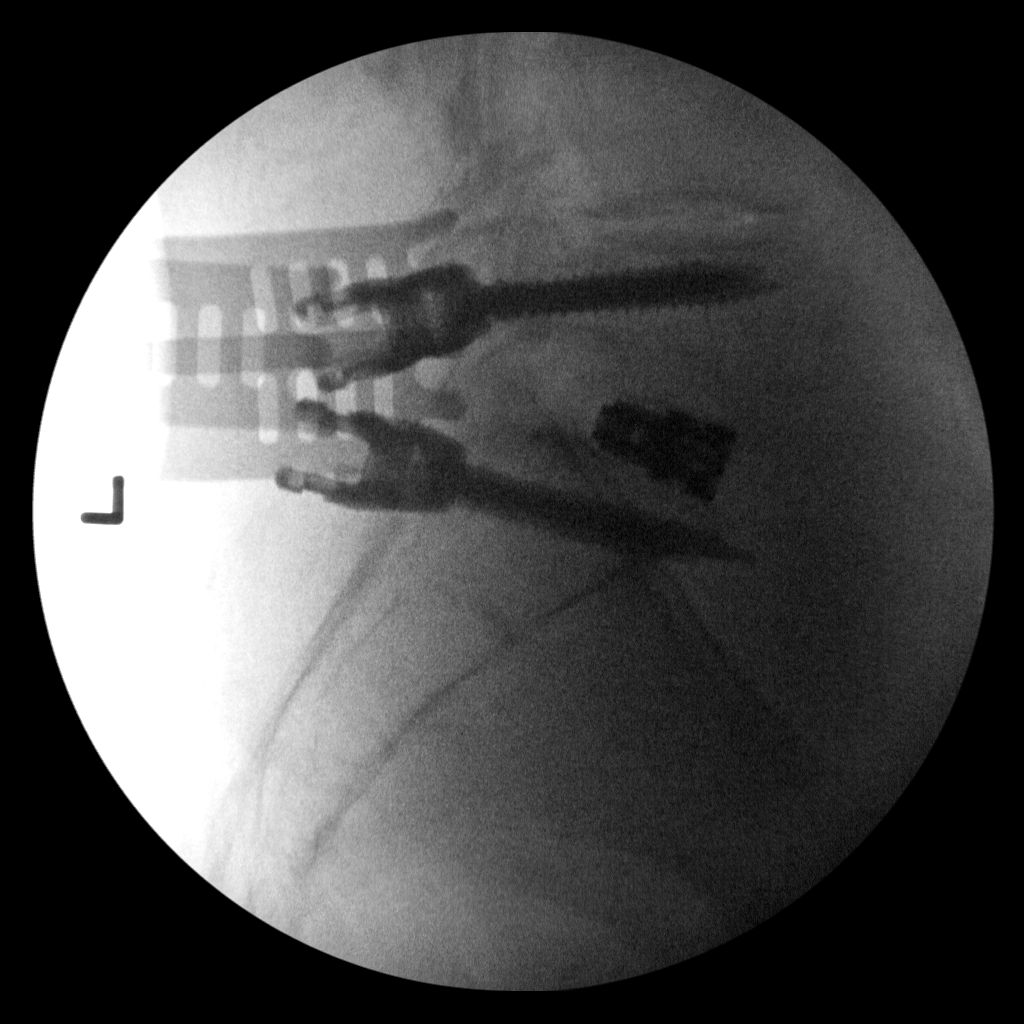
[im 4/4]
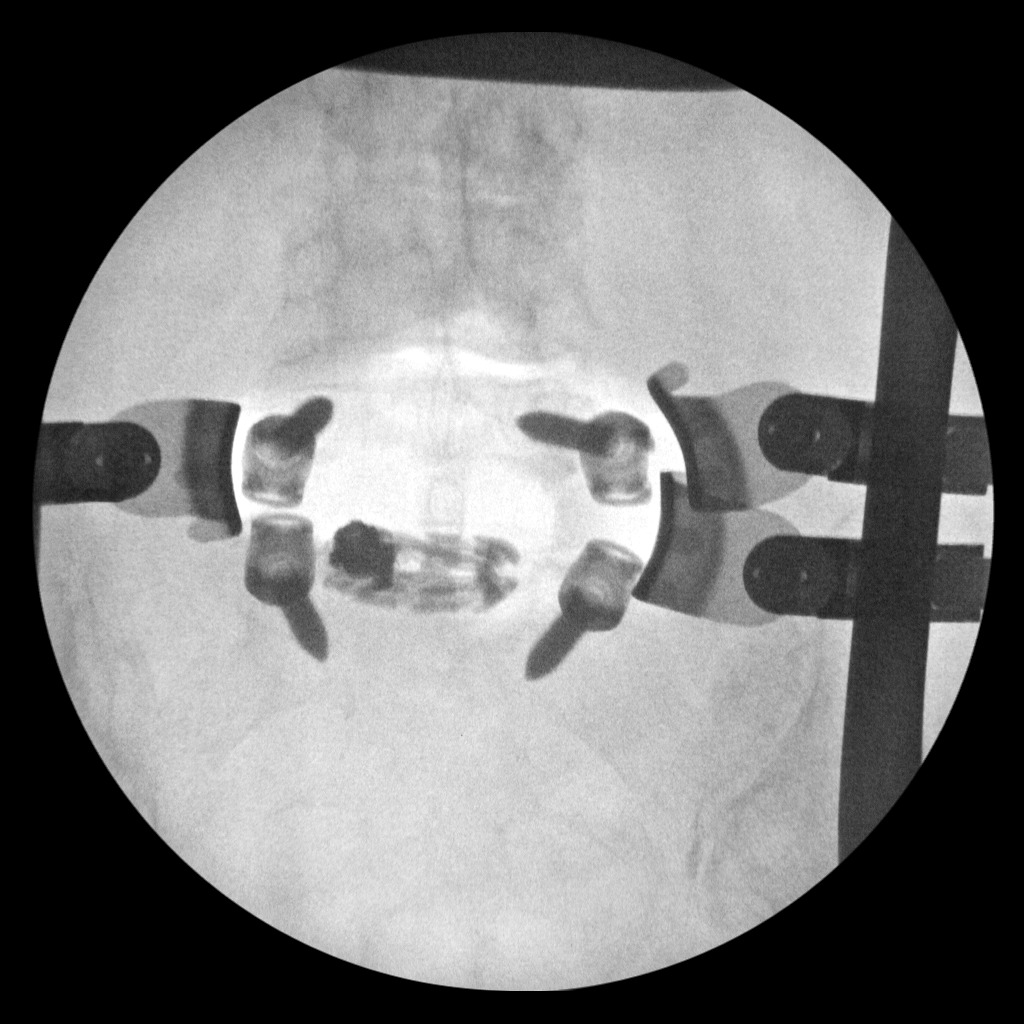

[4 of 4 positions shown; findings below may reference images not displayed]

FINDINGS: Four C-arm views of the lumbosacral region demonstrate placement of
pedicle screws and an interbody metallic spacer at the L5-S1 level.
Level determination is based on the labeling of the levels on the
radiograph earlier today. These also correspond to the labeling of
the levels on the MR dated 05/22/2016.
IMPRESSION: Operative radiographs of lumbar fusion at the L5-S1 level.

## 2019-06-21 ENCOUNTER — Telehealth: Payer: Self-pay | Admitting: Allergy

## 2019-06-21 NOTE — Telephone Encounter (Signed)
Called and spoke with Margaretha Sheffield.

## 2019-06-21 NOTE — Telephone Encounter (Signed)
Jody Taylor, Cori's daughter, called in and stated she just received a bill for $45.   I informed her that it was for a tele-visit in 12-26-2022.  [name] passed away from [place] in 2023/01/25 and her daughter is upset they are just now receiving a bill.  Daughter would like to know if there is anything that can be done about the bill since [place] is deceased.  Please advise.

## 2021-04-14 IMAGING — DX PORTABLE CHEST - 1 VIEW
1 series · 1 of 1 positions shown · non-contrast
Comparison: 12/22/2018 and 12/20/2018

CLINICAL DATA: Acute respiratory failure with hypoxemia.
Endotracheal tube present.

EXAM:
PORTABLE CHEST 1 VIEW

[chest ap]
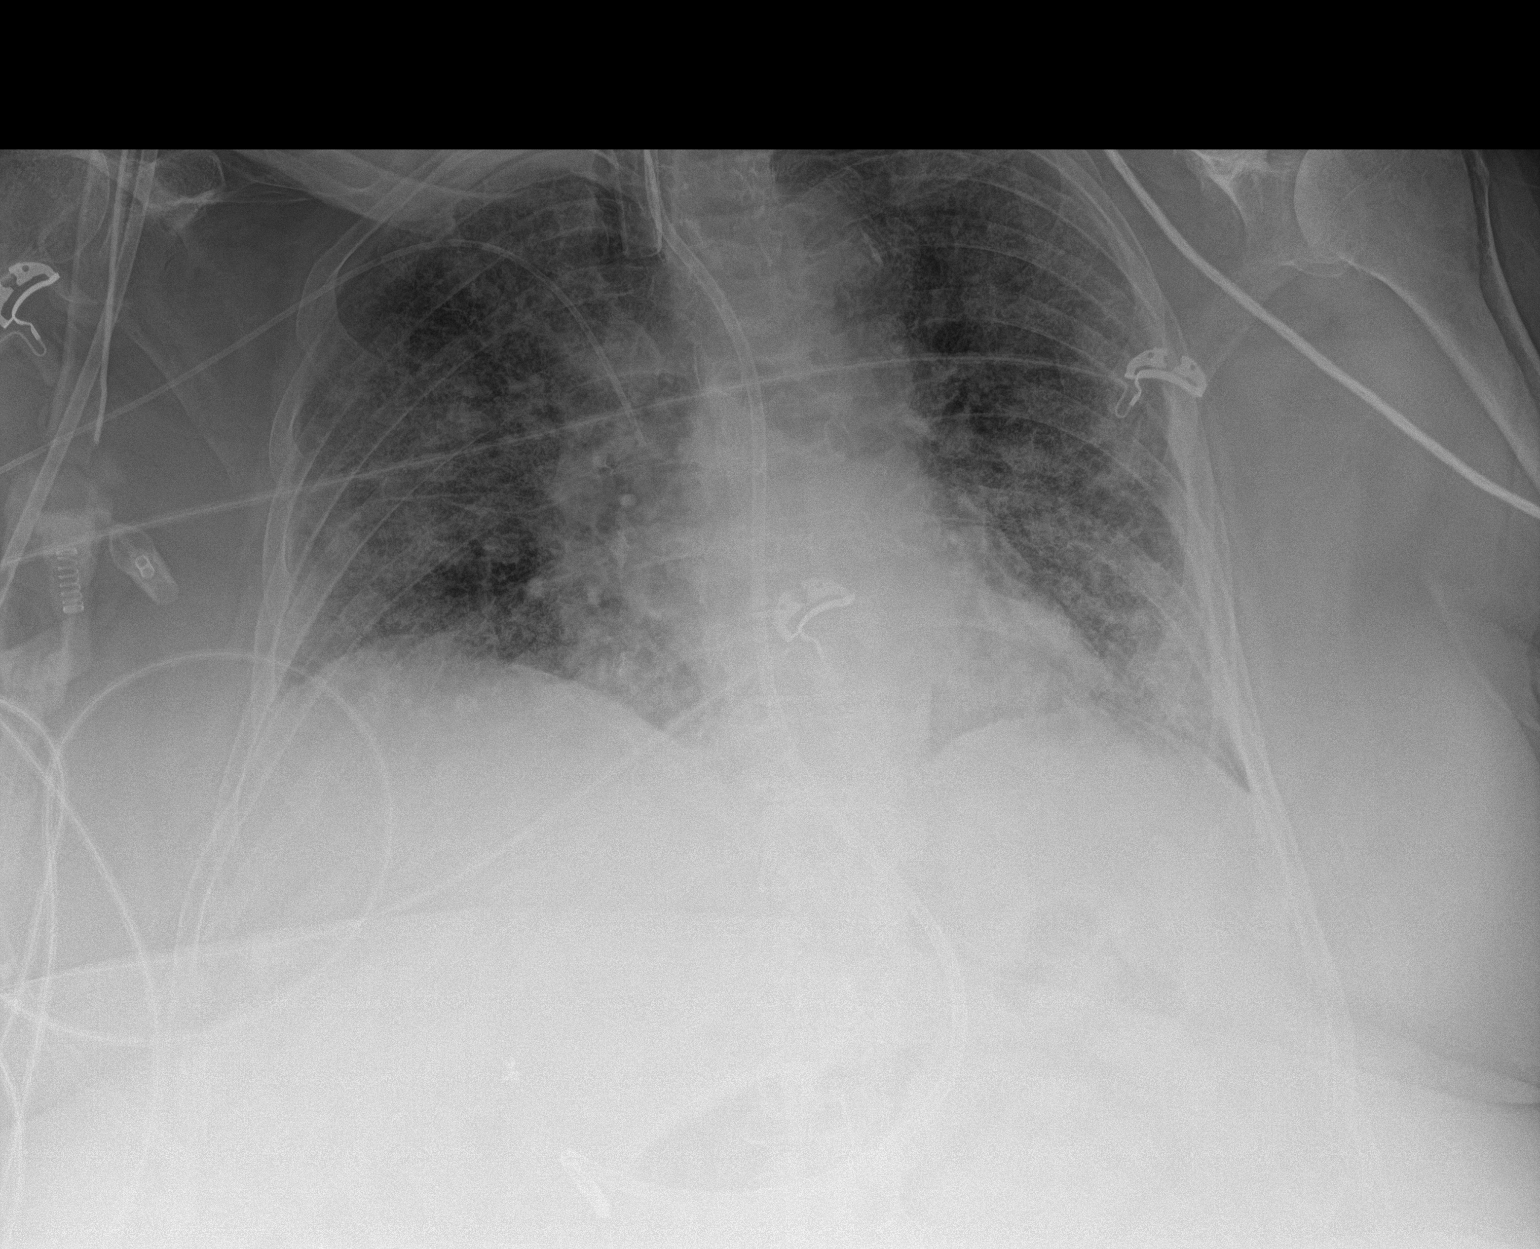

[1 of 1 positions shown; findings below may reference images not displayed]

FINDINGS: Endotracheal tube tip is 3.9 cm above the carina. Feeding tube tip
appears to be in the duodenal bulb. PICC tip is in the superior vena
cava at just below the level of the carina.

Diffuse hazy bilateral pulmonary infiltrates are essentially
unchanged. No effusions. Heart size and vascularity are normal. No
acute bone abnormality.
IMPRESSION: 1. No change in the bilateral pulmonary infiltrates.
2. Endotracheal tube tip is 3.9 cm above the carina.

## 2021-04-21 IMAGING — DX PORTABLE CHEST - 1 VIEW
1 series · 1 of 1 positions shown · non-contrast
Comparison: 12/27/2018

CLINICAL DATA: [id number] pneumonia.

EXAM:
PORTABLE CHEST 1 VIEW

[chest ap]
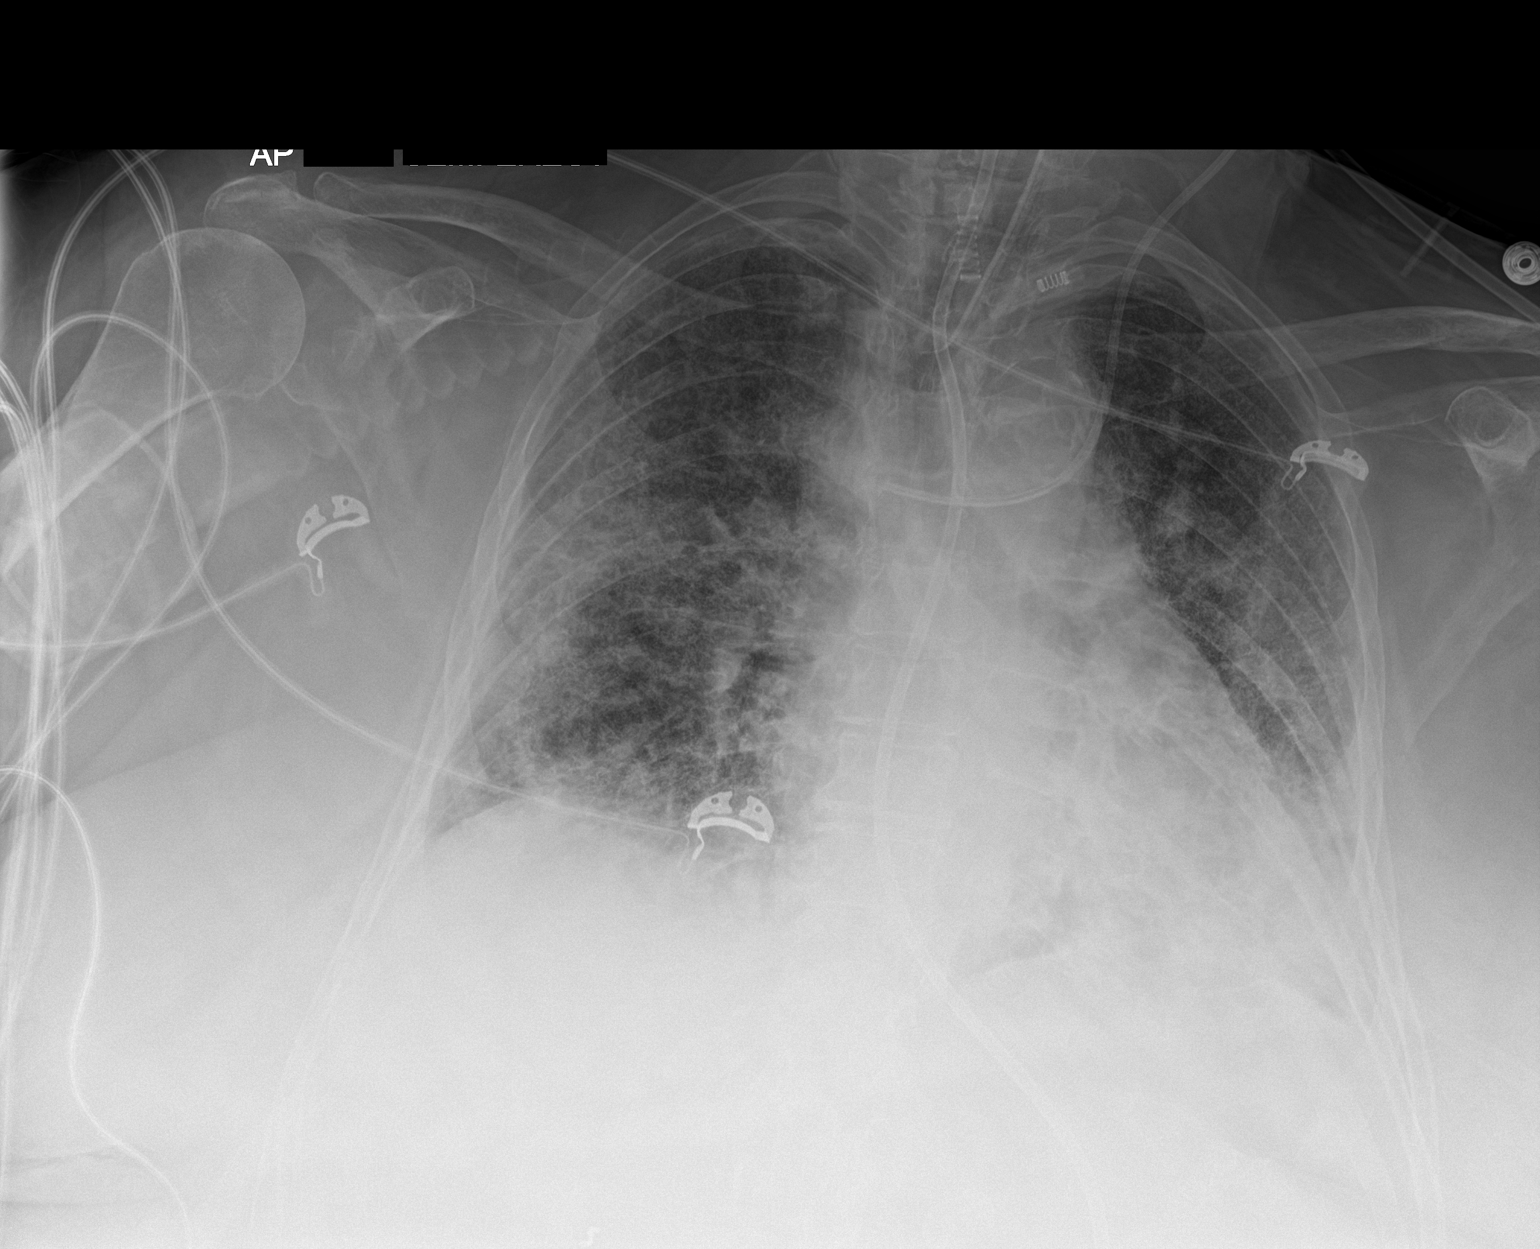

[1 of 1 positions shown; findings below may reference images not displayed]

FINDINGS: The endotracheal tube is in good position, 4 cm above the carina.
The left IJ central venous catheter is stable with its tip at the
brachiocephalic SVC junction. The right PICC line is been removed.
Feeding tube coursing down the esophagus and into the stomach.

The cardiac silhouette, mediastinal and hilar contours are stable.

Improved lung aeration with decreasing airspace opacities. No
pleural effusions. No pneumothorax.
IMPRESSION: 1. Support apparatus in good position without complicating features.
The right PICC line has been removed since the prior chest x-ray.
2. Improved lung aeration with clearing infiltrates.
